# Patient Record
Sex: Female | Born: 1950
Health system: Southern US, Community
[De-identification: ages and names within clinical notes are randomized; demographics above are authoritative.]

## PROBLEM LIST (undated history)

## (undated) DIAGNOSIS — E785 Hyperlipidemia, unspecified: Secondary | ICD-10-CM

## (undated) DIAGNOSIS — I10 Essential (primary) hypertension: Secondary | ICD-10-CM

## (undated) DIAGNOSIS — I499 Cardiac arrhythmia, unspecified: Secondary | ICD-10-CM

## (undated) DIAGNOSIS — G2581 Restless legs syndrome: Secondary | ICD-10-CM

## (undated) DIAGNOSIS — M1990 Unspecified osteoarthritis, unspecified site: Secondary | ICD-10-CM

## (undated) DIAGNOSIS — I4891 Unspecified atrial fibrillation: Secondary | ICD-10-CM

## (undated) DIAGNOSIS — K5790 Diverticulosis of intestine, part unspecified, without perforation or abscess without bleeding: Secondary | ICD-10-CM

## (undated) DIAGNOSIS — K529 Noninfective gastroenteritis and colitis, unspecified: Secondary | ICD-10-CM

## (undated) HISTORY — DX: Diverticulosis of intestine, part unspecified, without perforation or abscess without bleeding: K57.90

## (undated) HISTORY — DX: Restless legs syndrome: G25.81

## (undated) HISTORY — PX: TOE SURGERY: SHX1073

## (undated) HISTORY — DX: Noninfective gastroenteritis and colitis, unspecified: K52.9

## (undated) HISTORY — PX: KNEE SURGERY: SHX244

---

## 2002-09-21 HISTORY — PX: CHOLECYSTECTOMY: SHX55

## 2014-03-20 DIAGNOSIS — I10 Essential (primary) hypertension: Secondary | ICD-10-CM | POA: Diagnosis not present

## 2014-03-20 DIAGNOSIS — F17200 Nicotine dependence, unspecified, uncomplicated: Secondary | ICD-10-CM | POA: Diagnosis not present

## 2014-03-23 DIAGNOSIS — I10 Essential (primary) hypertension: Secondary | ICD-10-CM | POA: Diagnosis not present

## 2014-03-23 DIAGNOSIS — Z0000 Encounter for general adult medical examination without abnormal findings: Secondary | ICD-10-CM | POA: Diagnosis not present

## 2014-03-27 DIAGNOSIS — I10 Essential (primary) hypertension: Secondary | ICD-10-CM | POA: Diagnosis not present

## 2014-03-27 DIAGNOSIS — M79609 Pain in unspecified limb: Secondary | ICD-10-CM | POA: Diagnosis not present

## 2014-04-19 DIAGNOSIS — Z01419 Encounter for gynecological examination (general) (routine) without abnormal findings: Secondary | ICD-10-CM | POA: Diagnosis not present

## 2014-04-19 DIAGNOSIS — Z1231 Encounter for screening mammogram for malignant neoplasm of breast: Secondary | ICD-10-CM | POA: Diagnosis not present

## 2014-04-19 DIAGNOSIS — Z0000 Encounter for general adult medical examination without abnormal findings: Secondary | ICD-10-CM | POA: Diagnosis not present

## 2014-04-19 DIAGNOSIS — Z23 Encounter for immunization: Secondary | ICD-10-CM | POA: Diagnosis not present

## 2014-04-19 DIAGNOSIS — Z780 Asymptomatic menopausal state: Secondary | ICD-10-CM | POA: Diagnosis not present

## 2014-04-25 DIAGNOSIS — Z1329 Encounter for screening for other suspected endocrine disorder: Secondary | ICD-10-CM | POA: Diagnosis not present

## 2014-04-25 DIAGNOSIS — E559 Vitamin D deficiency, unspecified: Secondary | ICD-10-CM | POA: Diagnosis not present

## 2014-04-25 DIAGNOSIS — G609 Hereditary and idiopathic neuropathy, unspecified: Secondary | ICD-10-CM | POA: Diagnosis not present

## 2014-04-25 DIAGNOSIS — G589 Mononeuropathy, unspecified: Secondary | ICD-10-CM | POA: Diagnosis not present

## 2014-04-25 DIAGNOSIS — E039 Hypothyroidism, unspecified: Secondary | ICD-10-CM | POA: Diagnosis not present

## 2014-04-25 DIAGNOSIS — G2581 Restless legs syndrome: Secondary | ICD-10-CM | POA: Diagnosis not present

## 2014-04-30 DIAGNOSIS — Z1231 Encounter for screening mammogram for malignant neoplasm of breast: Secondary | ICD-10-CM | POA: Diagnosis not present

## 2014-04-30 DIAGNOSIS — Z780 Asymptomatic menopausal state: Secondary | ICD-10-CM | POA: Diagnosis not present

## 2014-05-16 DIAGNOSIS — G5760 Lesion of plantar nerve, unspecified lower limb: Secondary | ICD-10-CM | POA: Diagnosis not present

## 2014-05-17 DIAGNOSIS — G5760 Lesion of plantar nerve, unspecified lower limb: Secondary | ICD-10-CM | POA: Diagnosis not present

## 2014-06-08 DIAGNOSIS — J069 Acute upper respiratory infection, unspecified: Secondary | ICD-10-CM | POA: Diagnosis not present

## 2014-06-11 DIAGNOSIS — R509 Fever, unspecified: Secondary | ICD-10-CM | POA: Diagnosis not present

## 2014-06-11 DIAGNOSIS — K5289 Other specified noninfective gastroenteritis and colitis: Secondary | ICD-10-CM | POA: Diagnosis not present

## 2014-06-11 DIAGNOSIS — J029 Acute pharyngitis, unspecified: Secondary | ICD-10-CM | POA: Diagnosis not present

## 2014-06-12 DIAGNOSIS — R509 Fever, unspecified: Secondary | ICD-10-CM | POA: Diagnosis not present

## 2014-06-16 DIAGNOSIS — R197 Diarrhea, unspecified: Secondary | ICD-10-CM | POA: Diagnosis not present

## 2014-06-16 DIAGNOSIS — F17200 Nicotine dependence, unspecified, uncomplicated: Secondary | ICD-10-CM | POA: Diagnosis not present

## 2014-06-16 DIAGNOSIS — I10 Essential (primary) hypertension: Secondary | ICD-10-CM | POA: Diagnosis not present

## 2014-06-16 DIAGNOSIS — R1110 Vomiting, unspecified: Secondary | ICD-10-CM | POA: Diagnosis not present

## 2014-06-16 DIAGNOSIS — R000 Tachycardia, unspecified: Secondary | ICD-10-CM | POA: Diagnosis not present

## 2014-06-19 DIAGNOSIS — E860 Dehydration: Secondary | ICD-10-CM | POA: Diagnosis not present

## 2014-06-19 DIAGNOSIS — K5289 Other specified noninfective gastroenteritis and colitis: Secondary | ICD-10-CM | POA: Diagnosis not present

## 2014-06-25 DIAGNOSIS — R079 Chest pain, unspecified: Secondary | ICD-10-CM | POA: Diagnosis not present

## 2014-06-25 DIAGNOSIS — K449 Diaphragmatic hernia without obstruction or gangrene: Secondary | ICD-10-CM | POA: Diagnosis not present

## 2014-06-25 DIAGNOSIS — R634 Abnormal weight loss: Secondary | ICD-10-CM | POA: Diagnosis not present

## 2014-06-25 DIAGNOSIS — Z23 Encounter for immunization: Secondary | ICD-10-CM | POA: Diagnosis not present

## 2014-06-25 DIAGNOSIS — R1031 Right lower quadrant pain: Secondary | ICD-10-CM | POA: Diagnosis not present

## 2014-06-25 DIAGNOSIS — I4891 Unspecified atrial fibrillation: Secondary | ICD-10-CM | POA: Diagnosis not present

## 2014-06-25 DIAGNOSIS — K648 Other hemorrhoids: Secondary | ICD-10-CM | POA: Diagnosis not present

## 2014-06-25 DIAGNOSIS — Z9049 Acquired absence of other specified parts of digestive tract: Secondary | ICD-10-CM | POA: Diagnosis not present

## 2014-06-25 DIAGNOSIS — R432 Parageusia: Secondary | ICD-10-CM | POA: Diagnosis not present

## 2014-06-25 DIAGNOSIS — I82401 Acute embolism and thrombosis of unspecified deep veins of right lower extremity: Secondary | ICD-10-CM | POA: Diagnosis not present

## 2014-06-25 DIAGNOSIS — Z833 Family history of diabetes mellitus: Secondary | ICD-10-CM | POA: Diagnosis not present

## 2014-06-25 DIAGNOSIS — I482 Chronic atrial fibrillation: Secondary | ICD-10-CM | POA: Diagnosis not present

## 2014-06-25 DIAGNOSIS — I10 Essential (primary) hypertension: Secondary | ICD-10-CM | POA: Diagnosis not present

## 2014-06-25 DIAGNOSIS — R531 Weakness: Secondary | ICD-10-CM | POA: Diagnosis not present

## 2014-06-25 DIAGNOSIS — R195 Other fecal abnormalities: Secondary | ICD-10-CM | POA: Diagnosis not present

## 2014-06-25 DIAGNOSIS — K259 Gastric ulcer, unspecified as acute or chronic, without hemorrhage or perforation: Secondary | ICD-10-CM | POA: Diagnosis not present

## 2014-06-25 DIAGNOSIS — I80221 Phlebitis and thrombophlebitis of right popliteal vein: Secondary | ICD-10-CM | POA: Diagnosis not present

## 2014-06-25 DIAGNOSIS — Z720 Tobacco use: Secondary | ICD-10-CM | POA: Diagnosis not present

## 2014-06-25 DIAGNOSIS — Z8249 Family history of ischemic heart disease and other diseases of the circulatory system: Secondary | ICD-10-CM | POA: Diagnosis not present

## 2014-06-25 DIAGNOSIS — R509 Fever, unspecified: Secondary | ICD-10-CM | POA: Diagnosis not present

## 2014-06-25 DIAGNOSIS — I82431 Acute embolism and thrombosis of right popliteal vein: Secondary | ICD-10-CM | POA: Diagnosis not present

## 2014-06-25 DIAGNOSIS — D61818 Other pancytopenia: Secondary | ICD-10-CM | POA: Diagnosis not present

## 2014-06-25 DIAGNOSIS — I480 Paroxysmal atrial fibrillation: Secondary | ICD-10-CM | POA: Diagnosis not present

## 2014-06-25 DIAGNOSIS — I82409 Acute embolism and thrombosis of unspecified deep veins of unspecified lower extremity: Secondary | ICD-10-CM | POA: Diagnosis not present

## 2014-06-25 DIAGNOSIS — K5730 Diverticulosis of large intestine without perforation or abscess without bleeding: Secondary | ICD-10-CM | POA: Diagnosis not present

## 2014-06-25 DIAGNOSIS — K284 Chronic or unspecified gastrojejunal ulcer with hemorrhage: Secondary | ICD-10-CM | POA: Diagnosis not present

## 2014-06-25 DIAGNOSIS — Z888 Allergy status to other drugs, medicaments and biological substances status: Secondary | ICD-10-CM | POA: Diagnosis not present

## 2014-06-25 DIAGNOSIS — E860 Dehydration: Secondary | ICD-10-CM | POA: Diagnosis not present

## 2014-06-25 DIAGNOSIS — D6489 Other specified anemias: Secondary | ICD-10-CM | POA: Diagnosis not present

## 2014-06-25 DIAGNOSIS — K922 Gastrointestinal hemorrhage, unspecified: Secondary | ICD-10-CM | POA: Diagnosis not present

## 2014-06-25 DIAGNOSIS — R197 Diarrhea, unspecified: Secondary | ICD-10-CM | POA: Diagnosis not present

## 2014-06-25 DIAGNOSIS — R112 Nausea with vomiting, unspecified: Secondary | ICD-10-CM | POA: Diagnosis not present

## 2014-06-25 DIAGNOSIS — Z716 Tobacco abuse counseling: Secondary | ICD-10-CM | POA: Diagnosis not present

## 2014-06-26 DIAGNOSIS — I82409 Acute embolism and thrombosis of unspecified deep veins of unspecified lower extremity: Secondary | ICD-10-CM | POA: Diagnosis not present

## 2014-06-26 DIAGNOSIS — I4891 Unspecified atrial fibrillation: Secondary | ICD-10-CM | POA: Diagnosis not present

## 2014-07-06 DIAGNOSIS — E860 Dehydration: Secondary | ICD-10-CM | POA: Diagnosis not present

## 2014-07-06 DIAGNOSIS — D649 Anemia, unspecified: Secondary | ICD-10-CM | POA: Diagnosis not present

## 2014-07-16 DIAGNOSIS — I10 Essential (primary) hypertension: Secondary | ICD-10-CM | POA: Diagnosis not present

## 2014-07-16 DIAGNOSIS — I82409 Acute embolism and thrombosis of unspecified deep veins of unspecified lower extremity: Secondary | ICD-10-CM | POA: Diagnosis not present

## 2014-07-16 DIAGNOSIS — D649 Anemia, unspecified: Secondary | ICD-10-CM | POA: Diagnosis not present

## 2014-07-16 DIAGNOSIS — I480 Paroxysmal atrial fibrillation: Secondary | ICD-10-CM | POA: Diagnosis not present

## 2014-07-16 DIAGNOSIS — I519 Heart disease, unspecified: Secondary | ICD-10-CM | POA: Diagnosis not present

## 2014-07-16 DIAGNOSIS — I8290 Acute embolism and thrombosis of unspecified vein: Secondary | ICD-10-CM | POA: Diagnosis not present

## 2014-07-16 DIAGNOSIS — I82431 Acute embolism and thrombosis of right popliteal vein: Secondary | ICD-10-CM | POA: Diagnosis not present

## 2014-07-18 DIAGNOSIS — G5762 Lesion of plantar nerve, left lower limb: Secondary | ICD-10-CM | POA: Diagnosis not present

## 2014-07-27 DIAGNOSIS — Z7901 Long term (current) use of anticoagulants: Secondary | ICD-10-CM | POA: Diagnosis not present

## 2014-07-31 DIAGNOSIS — I82401 Acute embolism and thrombosis of unspecified deep veins of right lower extremity: Secondary | ICD-10-CM | POA: Diagnosis not present

## 2014-07-31 DIAGNOSIS — I480 Paroxysmal atrial fibrillation: Secondary | ICD-10-CM | POA: Diagnosis not present

## 2014-08-02 DIAGNOSIS — Z7901 Long term (current) use of anticoagulants: Secondary | ICD-10-CM | POA: Diagnosis not present

## 2014-08-06 DIAGNOSIS — Z7901 Long term (current) use of anticoagulants: Secondary | ICD-10-CM | POA: Diagnosis not present

## 2014-08-14 DIAGNOSIS — Z7901 Long term (current) use of anticoagulants: Secondary | ICD-10-CM | POA: Diagnosis not present

## 2014-08-20 DIAGNOSIS — Z7901 Long term (current) use of anticoagulants: Secondary | ICD-10-CM | POA: Diagnosis not present

## 2014-08-27 DIAGNOSIS — I8290 Acute embolism and thrombosis of unspecified vein: Secondary | ICD-10-CM | POA: Diagnosis not present

## 2014-08-27 DIAGNOSIS — D649 Anemia, unspecified: Secondary | ICD-10-CM | POA: Diagnosis not present

## 2014-08-27 DIAGNOSIS — I82401 Acute embolism and thrombosis of unspecified deep veins of right lower extremity: Secondary | ICD-10-CM | POA: Diagnosis not present

## 2014-08-27 DIAGNOSIS — Z7901 Long term (current) use of anticoagulants: Secondary | ICD-10-CM | POA: Diagnosis not present

## 2014-09-01 DIAGNOSIS — I4891 Unspecified atrial fibrillation: Secondary | ICD-10-CM | POA: Diagnosis not present

## 2014-09-01 DIAGNOSIS — I249 Acute ischemic heart disease, unspecified: Secondary | ICD-10-CM | POA: Diagnosis not present

## 2014-09-01 DIAGNOSIS — R0789 Other chest pain: Secondary | ICD-10-CM | POA: Diagnosis not present

## 2014-09-01 DIAGNOSIS — R0602 Shortness of breath: Secondary | ICD-10-CM | POA: Diagnosis not present

## 2014-09-01 DIAGNOSIS — I10 Essential (primary) hypertension: Secondary | ICD-10-CM | POA: Diagnosis not present

## 2014-09-01 DIAGNOSIS — Z720 Tobacco use: Secondary | ICD-10-CM | POA: Diagnosis not present

## 2014-09-01 DIAGNOSIS — I824Z1 Acute embolism and thrombosis of unspecified deep veins of right distal lower extremity: Secondary | ICD-10-CM | POA: Diagnosis not present

## 2014-09-01 DIAGNOSIS — R079 Chest pain, unspecified: Secondary | ICD-10-CM | POA: Diagnosis not present

## 2014-09-01 DIAGNOSIS — E785 Hyperlipidemia, unspecified: Secondary | ICD-10-CM | POA: Diagnosis not present

## 2014-09-01 DIAGNOSIS — I480 Paroxysmal atrial fibrillation: Secondary | ICD-10-CM | POA: Diagnosis not present

## 2014-09-01 DIAGNOSIS — Z7901 Long term (current) use of anticoagulants: Secondary | ICD-10-CM | POA: Diagnosis not present

## 2014-09-02 DIAGNOSIS — I10 Essential (primary) hypertension: Secondary | ICD-10-CM | POA: Diagnosis not present

## 2014-09-02 DIAGNOSIS — R079 Chest pain, unspecified: Secondary | ICD-10-CM | POA: Diagnosis not present

## 2014-09-02 DIAGNOSIS — I4891 Unspecified atrial fibrillation: Secondary | ICD-10-CM | POA: Diagnosis not present

## 2014-09-02 DIAGNOSIS — R0602 Shortness of breath: Secondary | ICD-10-CM | POA: Diagnosis not present

## 2014-09-03 DIAGNOSIS — I82409 Acute embolism and thrombosis of unspecified deep veins of unspecified lower extremity: Secondary | ICD-10-CM | POA: Diagnosis not present

## 2014-09-03 DIAGNOSIS — I480 Paroxysmal atrial fibrillation: Secondary | ICD-10-CM | POA: Diagnosis not present

## 2014-09-03 DIAGNOSIS — E876 Hypokalemia: Secondary | ICD-10-CM | POA: Diagnosis not present

## 2014-09-03 DIAGNOSIS — R0789 Other chest pain: Secondary | ICD-10-CM | POA: Diagnosis not present

## 2014-09-03 DIAGNOSIS — I10 Essential (primary) hypertension: Secondary | ICD-10-CM | POA: Diagnosis not present

## 2014-09-03 DIAGNOSIS — R079 Chest pain, unspecified: Secondary | ICD-10-CM | POA: Diagnosis not present

## 2014-09-05 DIAGNOSIS — Z7901 Long term (current) use of anticoagulants: Secondary | ICD-10-CM | POA: Diagnosis not present

## 2014-09-10 DIAGNOSIS — R079 Chest pain, unspecified: Secondary | ICD-10-CM | POA: Diagnosis not present

## 2014-09-10 DIAGNOSIS — F329 Major depressive disorder, single episode, unspecified: Secondary | ICD-10-CM | POA: Diagnosis not present

## 2014-09-19 DIAGNOSIS — Z7901 Long term (current) use of anticoagulants: Secondary | ICD-10-CM | POA: Diagnosis not present

## 2014-10-03 DIAGNOSIS — Z7901 Long term (current) use of anticoagulants: Secondary | ICD-10-CM | POA: Diagnosis not present

## 2014-11-05 DIAGNOSIS — Z720 Tobacco use: Secondary | ICD-10-CM | POA: Diagnosis not present

## 2014-11-05 DIAGNOSIS — F329 Major depressive disorder, single episode, unspecified: Secondary | ICD-10-CM | POA: Diagnosis not present

## 2014-11-05 DIAGNOSIS — Z716 Tobacco abuse counseling: Secondary | ICD-10-CM | POA: Diagnosis not present

## 2014-11-05 DIAGNOSIS — Z7901 Long term (current) use of anticoagulants: Secondary | ICD-10-CM | POA: Diagnosis not present

## 2014-11-12 DIAGNOSIS — Z7901 Long term (current) use of anticoagulants: Secondary | ICD-10-CM | POA: Diagnosis not present

## 2014-12-06 DIAGNOSIS — Z7901 Long term (current) use of anticoagulants: Secondary | ICD-10-CM | POA: Diagnosis not present

## 2014-12-06 DIAGNOSIS — I82401 Acute embolism and thrombosis of unspecified deep veins of right lower extremity: Secondary | ICD-10-CM | POA: Diagnosis not present

## 2015-01-14 DIAGNOSIS — I82409 Acute embolism and thrombosis of unspecified deep veins of unspecified lower extremity: Secondary | ICD-10-CM | POA: Diagnosis not present

## 2015-01-14 DIAGNOSIS — I517 Cardiomegaly: Secondary | ICD-10-CM | POA: Diagnosis not present

## 2015-01-14 DIAGNOSIS — I5030 Unspecified diastolic (congestive) heart failure: Secondary | ICD-10-CM | POA: Diagnosis not present

## 2015-01-14 DIAGNOSIS — I480 Paroxysmal atrial fibrillation: Secondary | ICD-10-CM | POA: Diagnosis not present

## 2015-01-14 DIAGNOSIS — I5042 Chronic combined systolic (congestive) and diastolic (congestive) heart failure: Secondary | ICD-10-CM | POA: Diagnosis not present

## 2015-01-14 DIAGNOSIS — I2699 Other pulmonary embolism without acute cor pulmonale: Secondary | ICD-10-CM | POA: Diagnosis not present

## 2015-01-14 DIAGNOSIS — I10 Essential (primary) hypertension: Secondary | ICD-10-CM | POA: Diagnosis not present

## 2015-01-24 DIAGNOSIS — I8290 Acute embolism and thrombosis of unspecified vein: Secondary | ICD-10-CM | POA: Diagnosis not present

## 2015-01-24 DIAGNOSIS — I82401 Acute embolism and thrombosis of unspecified deep veins of right lower extremity: Secondary | ICD-10-CM | POA: Diagnosis not present

## 2015-01-24 DIAGNOSIS — D649 Anemia, unspecified: Secondary | ICD-10-CM | POA: Diagnosis not present

## 2015-05-24 ENCOUNTER — Ambulatory Visit (INDEPENDENT_AMBULATORY_CARE_PROVIDER_SITE_OTHER): Payer: Medicare Other | Admitting: Primary Care

## 2015-05-24 ENCOUNTER — Encounter (INDEPENDENT_AMBULATORY_CARE_PROVIDER_SITE_OTHER): Payer: Self-pay

## 2015-05-24 ENCOUNTER — Encounter: Payer: Self-pay | Admitting: Primary Care

## 2015-05-24 VITALS — BP 122/70 | HR 64 | Temp 98.2°F | Ht 66.0 in | Wt 178.4 lb

## 2015-05-24 DIAGNOSIS — F411 Generalized anxiety disorder: Secondary | ICD-10-CM

## 2015-05-24 DIAGNOSIS — F419 Anxiety disorder, unspecified: Secondary | ICD-10-CM

## 2015-05-24 DIAGNOSIS — I4891 Unspecified atrial fibrillation: Secondary | ICD-10-CM | POA: Diagnosis not present

## 2015-05-24 DIAGNOSIS — Z720 Tobacco use: Secondary | ICD-10-CM | POA: Diagnosis not present

## 2015-05-24 DIAGNOSIS — F32A Depression, unspecified: Secondary | ICD-10-CM

## 2015-05-24 DIAGNOSIS — I10 Essential (primary) hypertension: Secondary | ICD-10-CM | POA: Diagnosis not present

## 2015-05-24 DIAGNOSIS — F329 Major depressive disorder, single episode, unspecified: Secondary | ICD-10-CM | POA: Insufficient documentation

## 2015-05-24 DIAGNOSIS — F17200 Nicotine dependence, unspecified, uncomplicated: Secondary | ICD-10-CM | POA: Insufficient documentation

## 2015-05-24 DIAGNOSIS — I480 Paroxysmal atrial fibrillation: Secondary | ICD-10-CM | POA: Insufficient documentation

## 2015-05-24 HISTORY — DX: Depression, unspecified: F32.A

## 2015-05-24 HISTORY — DX: Anxiety disorder, unspecified: F41.9

## 2015-05-24 HISTORY — DX: Paroxysmal atrial fibrillation: I48.0

## 2015-05-24 MED ORDER — METOPROLOL SUCCINATE ER 50 MG PO TB24: 50.0000 mg | ORAL_TABLET | Freq: Every day | ORAL | Status: DC

## 2015-05-24 MED ORDER — SERTRALINE HCL 50 MG PO TABS: 50.0000 mg | ORAL_TABLET | Freq: Every day | ORAL | Status: DC

## 2015-05-24 NOTE — Assessment & Plan Note (Signed)
Managed on lopressor 100 BID. She forgets to take the second dose often. Will switch to Toprol XL 50 mg once daily. HR 64 today . BP stable. History of Afib. Will continue to monitor.

## 2015-05-24 NOTE — Assessment & Plan Note (Signed)
History of once in October 2015. Once managed on coumadin for DVT which was d/c'd in June 2016. Denies palpitations, irregular HR, chest pain. Start daily aspirin today.  Will continue to monitor.

## 2015-05-24 NOTE — Patient Instructions (Signed)
Apply the 21 mg/day daily to a non hairy place on your arm. Do this for 6 weeks then decrease down to the 14 mg/day patch for 2 weeks, then decrease down to the 7 mg/day patch for 2 weeks. Replace daily.  If you get cravings in between you may also chew the nicotine gum or use the spray. Follow package directions.  Start metoprolol succinate (Toprol XL) tablets for blood pressure and heart rate. Take 1 tablet by mouth daily. Please notify me if you develop rapid heart rate or an increase in blood pressure.  I sent refills on your Sertraline.  Please schedule a physical with me in the next 3 months. You will also schedule a lab only appointment one week prior. We will discuss your lab results during your physical.  It was a pleasure to meet you today! Please don't hesitate to call me with any questions. Welcome to [facility]!

## 2015-05-24 NOTE — Assessment & Plan Note (Signed)
Smokes 1 PPD since teenage years. Quit for 10 years in the 90's. She is ready to quit today. Directions provided for patch. Use gum PRN for oral cravings. Will continue to monitor.

## 2015-05-24 NOTE — Assessment & Plan Note (Addendum)
Long standing history of. Currently managed on Zoloft 50 mg, feels well managed at current dose. Recent difficulty falling asleep on 2 specific occasions. She is to notify me if this becomes more frequent. Refills provided. Denies SI/HI.

## 2015-05-24 NOTE — Progress Notes (Signed)
Pre visit review using our clinic review tool, if applicable. No additional management support is needed unless otherwise documented below in the visit note. 

## 2015-05-24 NOTE — Progress Notes (Signed)
Subjective:    Patient ID: Stephanie [name], female    DOB: 1951/07/12, 64 y.o.   MRN: [id number]  HPI  Stephanie [name] is a 64 year old female who presents today to establish care and discuss the problems mentioned below. Will obtain old records.  1) Tobacco abuse: Smokes cigarettes. Started smoking in her teenage years, quit in the 1990's for 10 years and started back up in 2000. She's tried Chantix but could not tolerate due to nightmares, and  patches. She has had some help with the patch prior, but wasn't mentally ready at the time. She is ready today to quit. She currently smokes 1 PPD. Denies cough, hemoptysis; has minimal shortness of breath.  2) Essential hypertension: Diagnosed years ago. Managed on lopressor 100 mg twice daily. She was once managed by cardiology in [place]. No chest pain, headaches, dizziness, changes in vision.  3) Generalized Anxiety Disorder: Long standing history. Recently placed back on Zoloft 50 mg, was once on for years. She feels well managed with her current dose. Denies panic attacks and SI/HI. Recently she's had difficulty sleeping. She had 2 episodes where she could not sleep all night. This occurred 2-3 weeks ago. She's had no changes in routine or caffeine consumption. Slept well last several nights.  4) Atrial fibrillation: Discovered in October 2015 and was found to have a DVT. Her hematologist removed her from her Warfarin in June 2016 before moving to [facility]. She denies any atrial fibrillation, irregular heart rate, or palpitations since October 2015. She is currently not taking aspirin or any other blood thinning products.  Review of Systems  Constitutional: Negative for unexpected weight change.  HENT: Negative for rhinorrhea.   Respiratory: Negative for cough and shortness of breath.   Cardiovascular: Negative for chest pain.  Gastrointestinal: Negative for diarrhea and constipation.       Endorses history of IBS diarrhea type.  Genitourinary: Negative  for difficulty urinating.       Partial hysterectomy.   Musculoskeletal: Negative for myalgias and arthralgias.  Skin: Negative for rash.  Neurological: Negative for dizziness and headaches.  Psychiatric/Behavioral:       See HPI.       No past medical history on file.  Social History   Social History  . Marital Status: Single    Spouse Name: N/A  . Number of Children: N/A  . Years of Education: N/A   Occupational History  . Not on file.   Social History Main Topics  . Smoking status: Current Every Day Smoker -- 1.00 packs/day    Types: Cigarettes  . Smokeless tobacco: Not on file  . Alcohol Use: 0.0 oz/week    0 Standard drinks or equivalent per week     Comment: social  . Drug Use: Not on file  . Sexual Activity: Not on file   Other Topics Concern  . Not on file   Social History Narrative   Divorced   Just moved from [place].   Originally from [place].   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    No past surgical history on file.  No family history on file.  Allergies  Allergen Reactions  . Codeine   . Pyridium  [Phenazopyridine Hcl]     No current outpatient prescriptions on file prior to visit.   No current facility-administered medications on file prior to visit.    BP 122/70 mmHg  Pulse 64  Temp(Src) 98.2 F (36.8 C) (  Oral)  Ht 5\' 6"  (1.676 m)  Wt 178 lb 6.4 oz (80.922 kg)  BMI 28.81 kg/m2  SpO2 98%    Objective:   Physical Exam  Constitutional: She is oriented to person, place, and time. She appears well-nourished.  Cardiovascular: Normal rate and regular rhythm.   Pulmonary/Chest: Effort normal and breath sounds normal.  Neurological: She is alert and oriented to person, place, and time.  Skin: Skin is warm and dry.          Assessment & Plan:

## 2015-05-27 DIAGNOSIS — H04123 Dry eye syndrome of bilateral lacrimal glands: Secondary | ICD-10-CM | POA: Diagnosis not present

## 2015-05-27 DIAGNOSIS — H5203 Hypermetropia, bilateral: Secondary | ICD-10-CM | POA: Diagnosis not present

## 2015-05-27 DIAGNOSIS — H524 Presbyopia: Secondary | ICD-10-CM | POA: Diagnosis not present

## 2015-05-27 DIAGNOSIS — H52223 Regular astigmatism, bilateral: Secondary | ICD-10-CM | POA: Diagnosis not present

## 2015-05-27 DIAGNOSIS — H2513 Age-related nuclear cataract, bilateral: Secondary | ICD-10-CM | POA: Diagnosis not present

## 2015-07-16 ENCOUNTER — Telehealth: Payer: Self-pay | Admitting: Primary Care

## 2015-07-16 NOTE — Telephone Encounter (Signed)
If I recall correctly, she experienced nightmares with Chantix, does she still want to try this again?

## 2015-07-16 NOTE — Telephone Encounter (Signed)
Pt called stating she was not doing well on patches  for no smoking.  She wanted to know if she could get a rx [facility] Please advise

## 2015-07-17 ENCOUNTER — Telehealth: Payer: Self-pay | Admitting: Primary Care

## 2015-07-17 ENCOUNTER — Other Ambulatory Visit: Payer: Self-pay | Admitting: Primary Care

## 2015-07-17 DIAGNOSIS — Z720 Tobacco use: Secondary | ICD-10-CM

## 2015-07-17 MED ORDER — VARENICLINE TARTRATE 0.5 MG X 11 & 1 MG X 42 PO MISC: ORAL | Status: DC

## 2015-07-17 MED ORDER — VARENICLINE TARTRATE 1 MG PO TABS: 1.0000 mg | ORAL_TABLET | Freq: Two times a day (BID) | ORAL | Status: DC

## 2015-07-17 NOTE — Telephone Encounter (Signed)
We will call in the Chantix starting pack to her pharmacy. I have sent in the continuing month pack to her pharmacy. She will start with the starting pack and then progress to the continuing. We will follow up during her upcoming physical.

## 2015-07-17 NOTE — Telephone Encounter (Signed)
Called and notified patient of Kate's comments. Patient verbalized understanding. Patient stated that she would like to give Chantix another try. Patient also wanted to know if Anda Kraft received medical records yet regarding medication on her rest legs. Will talk about again on the next appointment.

## 2015-07-17 NOTE — Telephone Encounter (Signed)
Called in the [facility] starter pack to [facility]. Called and notified patient to start on the starter pack.

## 2015-08-09 ENCOUNTER — Other Ambulatory Visit: Payer: Self-pay | Admitting: Internal Medicine

## 2015-08-11 ENCOUNTER — Other Ambulatory Visit: Payer: Self-pay | Admitting: Primary Care

## 2015-08-11 DIAGNOSIS — E785 Hyperlipidemia, unspecified: Secondary | ICD-10-CM

## 2015-08-11 DIAGNOSIS — I10 Essential (primary) hypertension: Secondary | ICD-10-CM

## 2015-08-11 DIAGNOSIS — Z13220 Encounter for screening for lipoid disorders: Secondary | ICD-10-CM

## 2015-08-11 DIAGNOSIS — Z0000 Encounter for general adult medical examination without abnormal findings: Secondary | ICD-10-CM

## 2015-08-19 ENCOUNTER — Other Ambulatory Visit (INDEPENDENT_AMBULATORY_CARE_PROVIDER_SITE_OTHER): Payer: Medicare Other

## 2015-08-19 DIAGNOSIS — I10 Essential (primary) hypertension: Secondary | ICD-10-CM

## 2015-08-19 LAB — COMPREHENSIVE METABOLIC PANEL
ALBUMIN: 3.6 g/dL (ref 3.5–5.2)
ALT: 11 U/L (ref 0–35)
AST: 16 U/L (ref 0–37)
Alkaline Phosphatase: 66 U/L (ref 39–117)
BUN: 17 mg/dL (ref 6–23)
CHLORIDE: 108 meq/L (ref 96–112)
CO2: 29 mEq/L (ref 19–32)
CREATININE: 0.99 mg/dL (ref 0.40–1.20)
Calcium: 9.1 mg/dL (ref 8.4–10.5)
GFR: 59.57 mL/min — ABNORMAL LOW (ref >60.00)
GLUCOSE: 105 mg/dL — AB (ref 70–99)
Potassium: 4.5 mEq/L (ref 3.5–5.1)
SODIUM: 144 meq/L (ref 135–145)
TOTAL PROTEIN: 6.3 g/dL (ref 6.0–8.3)
Total Bilirubin: 0.5 mg/dL (ref 0.2–1.2)

## 2015-08-19 LAB — LIPID PANEL
CHOLESTEROL: 165 mg/dL (ref 0–200)
HDL: 50.4 mg/dL (ref >39.00)
LDL CALC: 98 mg/dL (ref 0–99)
NONHDL: 114.74
Total CHOL/HDL Ratio: 3
Triglycerides: 82 mg/dL (ref 0.0–149.0)
VLDL: 16.4 mg/dL (ref 0.0–40.0)

## 2015-08-23 ENCOUNTER — Encounter: Payer: Self-pay | Admitting: Primary Care

## 2015-08-23 ENCOUNTER — Ambulatory Visit (INDEPENDENT_AMBULATORY_CARE_PROVIDER_SITE_OTHER): Payer: Medicare Other | Admitting: Primary Care

## 2015-08-23 VITALS — BP 118/80 | HR 96 | Temp 98.1°F | Ht 66.0 in | Wt 183.1 lb

## 2015-08-23 DIAGNOSIS — Z720 Tobacco use: Secondary | ICD-10-CM | POA: Diagnosis not present

## 2015-08-23 DIAGNOSIS — I10 Essential (primary) hypertension: Secondary | ICD-10-CM

## 2015-08-23 DIAGNOSIS — F418 Other specified anxiety disorders: Secondary | ICD-10-CM

## 2015-08-23 DIAGNOSIS — F419 Anxiety disorder, unspecified: Principal | ICD-10-CM

## 2015-08-23 DIAGNOSIS — Z0000 Encounter for general adult medical examination without abnormal findings: Secondary | ICD-10-CM | POA: Insufficient documentation

## 2015-08-23 DIAGNOSIS — Z23 Encounter for immunization: Secondary | ICD-10-CM | POA: Diagnosis not present

## 2015-08-23 DIAGNOSIS — G2581 Restless legs syndrome: Secondary | ICD-10-CM | POA: Insufficient documentation

## 2015-08-23 DIAGNOSIS — F32A Depression, unspecified: Secondary | ICD-10-CM

## 2015-08-23 DIAGNOSIS — I4891 Unspecified atrial fibrillation: Secondary | ICD-10-CM

## 2015-08-23 DIAGNOSIS — F329 Major depressive disorder, single episode, unspecified: Secondary | ICD-10-CM

## 2015-08-23 MED ORDER — ROPINIROLE HCL 0.25 MG PO TABS: 0.2500 mg | ORAL_TABLET | Freq: Every day | ORAL | Status: DC

## 2015-08-23 MED ORDER — BUPROPION HCL ER (XL) 150 MG PO TB24: 150.0000 mg | ORAL_TABLET | Freq: Every day | ORAL | Status: DC

## 2015-08-23 NOTE — Patient Instructions (Addendum)
Start Bupropion XL 150 mg tablets for tobacco cessation and depression. Take 1 tablet by mouth every morning.  Start exercising. You should be getting 1 hour of moderate intensity exercise 5 days weekly. Work up to this slowly.  Follow up in 6 months for check up.  It was a pleasure to see you today!

## 2015-08-23 NOTE — Assessment & Plan Note (Signed)
Regular rate and rhythm today. Currently managed on daily aspirin.

## 2015-08-23 NOTE — Assessment & Plan Note (Signed)
Improvement on Zoloft 50 mg, but has felt down/depressed over the last month. PHQ 9 score of 12 today. Will start Wellbutrin XL for depression and tobacco abuse. Will obtain update from patient in 1 month.

## 2015-08-23 NOTE — Progress Notes (Signed)
Pre visit review using our clinic review tool, if applicable. No additional management support is needed unless otherwise documented below in the visit note. 

## 2015-08-23 NOTE — Assessment & Plan Note (Signed)
Never obtained Chantix, too expensive. She is cutting back on her daily amount of cigarettes, but still struggles. Added Wellbutrin today for depression and tobacco abuse. Will obtain update from patient in 1 month.

## 2015-08-23 NOTE — Assessment & Plan Note (Signed)
First wellness visit was 1 year ago. Influenza and pneumovax provided today. She will check insurance for coverage of Zostavax. Discussed importance of healthy diet and routine exercise.   Labs good. Exam unremarkable.  I have personally reviewed and have noted: 1. The patient's medical and social history 2. Their use of alcohol, tobacco or illicit drugs 3. Their current medications and supplements 4. The patient's functional ability including ADL's, fall risks,  home safety risks and hearing or visual impairment. 5. Diet and physical activities 6. Evidence for depression or mood disorder  Follow up in 1 year for subsequent visit.

## 2015-08-23 NOTE — Assessment & Plan Note (Signed)
Longstanding history of. Once on requip, has not been on for 2 years. Increase in symptoms recently. Requip 0.25 mg sent to pharmacy. She is to update [place] in 1 month.

## 2015-08-23 NOTE — Assessment & Plan Note (Signed)
Stable today. Continue Toprol XL 50 mg.

## 2015-08-23 NOTE — Progress Notes (Signed)
Patient ID: Stephanie [name], female   DOB: 1951-02-14, 64 y.o.   MRN: [id number]  HPI: Stephanie [name] is a 64 year old female who presents today for annual wellness visit, initial.  No past medical history on file.  Current Outpatient Prescriptions  Medication Sig Dispense Refill  . calcium citrate-vitamin D (CITRACAL+D) 315-200 MG-UNIT per tablet Take by mouth.    . metoprolol succinate (TOPROL-XL) 50 MG 24 hr tablet Take 1 tablet (50 mg total) by mouth daily. Take with or immediately following a meal. 30 tablet 3  . sertraline (ZOLOFT) 50 MG tablet Take 1 tablet (50 mg total) by mouth daily. 30 tablet 5   No current facility-administered medications for this visit.    Allergies  Allergen Reactions  . Codeine   . Pyridium  [Phenazopyridine Hcl]     No family history on file.  Social History   Social History  . Marital Status: Single    Spouse Name: N/A  . Number of Children: N/A  . Years of Education: N/A   Occupational History  . Not on file.   Social History Main Topics  . Smoking status: Current Every Day Smoker -- 1.00 packs/day    Types: Cigarettes  . Smokeless tobacco: Not on file  . Alcohol Use: 0.0 oz/week    0 Standard drinks or equivalent per week     Comment: social  . Drug Use: Not on file  . Sexual Activity: Not on file   Other Topics Concern  . Not on file   Social History Narrative   Divorced   Just moved from [place].   Originally from [place].   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    Hospitiliaztions: November 2015 for atrial fibrillation and blood clot, [place].  Health Maintenance:    Flu: Due, provide today.  Tetanus: Unsure.  Pneumovax: Has not completed, due today.  Prevnar: Completed in 2014  Zostavax: Has never completed.   Bone Density: Completed in [place] 1 year ago. Normal per patient.  Colonoscopy: Completed in 2015. Normal. Repeat in 5 years.  Eye Doctor: [facility],  completed in September 2016.  Dental Exam: Completed 2 years ago. Plans to schedule.  Mammogram: Completed 1 year ago. Wants to defer until next year.  PAP: Hysterectomy     Providers: Alma Friendly, PCP   I have personally reviewed and have noted: 1. The patient's medical and social history 2. Their use of alcohol, tobacco or illicit drugs 3. Their current medications and supplements 4. The patient's functional ability including ADL's, fall risks, home safety risks  and hearing or visual impairment. 5. Diet and physical activities 6. Evidence for depression or mood disorder  Subjective:   Review of Systems:   Constitutional: Denies fever, malaise, fatigue, headache or abrupt weight changes.  HEENT: Denies eye pain, eye redness, ear pain, ringing in the ears, wax buildup, runny nose, nasal congestion, bloody nose, or sore throat. Respiratory: Denies difficulty breathing, shortness of breath, cough or sputum production.  Current smoker.  Cardiovascular: Denies chest pain, chest tightness, palpitations or swelling in the hands or feet.  Gastrointestinal: Denies abdominal pain, bloating, constipation, diarrhea or blood in the stool. History of IBS, no recent flares. GU: Denies urgency, frequency, pain with urination, burning sensation, blood in urine, odor or discharge.  Musculoskeletal: Denies decrease in range of motion, difficulty with gait, muscle pain or joint pain and swelling. Pain to right hip intermittently for years.  Skin: Denies  redness, rashes, lesions or ulcercations. Does have dryness to skin. Neurological: Denies dizziness, difficulty with memory, difficulty with speech or problems with balance and coordination.   Restless leg: Long standing history. Once managed on Requip. Lately she's noticed increased symptoms at night. She's not been on medication in 2 years. She would like refills of her medication.   No other specific complaints in a complete review of systems  (except as listed in HPI above).  Objective:  PE:   Ht 5\' 6"  (1.676 m)  Wt 183 lb 1.9 oz (83.063 kg)  BMI 29.57 kg/m2 Wt Readings from Last 3 Encounters:  08/23/15 183 lb 1.9 oz (83.063 kg)  05/24/15 178 lb 6.4 oz (80.922 kg)    General: Appears their stated age, well developed, well nourished in NAD. Skin: Warm, dry and intact. No rashes, lesions or ulcerations noted. HEENT: Head: normal shape and size; Eyes: sclera white, no icterus, conjunctiva pink, PERRLA and EOMs intact; Ears: Tm's gray and intact, normal light reflex; Nose: mucosa pink and moist, septum midline; Throat/Mouth: Teeth present, mucosa pink and moist, no exudate, lesions or ulcerations noted.  Neck: Normal range of motion. Neck supple, trachea midline. No massses, lumps or thyromegaly present.  Cardiovascular: Normal rate and rhythm. S1,S2 noted.  No murmur, rubs or gallops noted. No JVD or BLE edema. No carotid bruits noted. Pulmonary/Chest: Normal effort and positive vesicular breath sounds. No respiratory distress. No wheezes, rales or ronchi noted.  Abdomen: Soft and nontender. Normal bowel sounds, no bruits noted. No distention or masses noted. Liver, spleen and kidneys non palpable. Musculoskeletal: Normal range of motion. No signs of joint swelling. No difficulty with gait.  Neurological: Alert and oriented. Cranial nerves II-XII intact. Coordination normal. +DTRs bilaterally. Psychiatric: Mood and affect normal. Behavior is normal. Judgment and thought content normal. PHQ 9 score of 12 today. Currently managed on Zoloft 50 mg.   BMET    Component Value Date/Time   NA 144 08/19/2015 0843   K 4.5 08/19/2015 0843   CL 108 08/19/2015 0843   CO2 29 08/19/2015 0843   GLUCOSE 105* 08/19/2015 0843   BUN 17 08/19/2015 0843   CREATININE 0.99 08/19/2015 0843   CALCIUM 9.1 08/19/2015 0843    Lipid Panel     Component Value Date/Time   CHOL 165 08/19/2015 0843   TRIG 82.0 08/19/2015 0843   HDL 50.40  08/19/2015 0843   CHOLHDL 3 08/19/2015 0843   VLDL 16.4 08/19/2015 0843   LDLCALC 98 08/19/2015 0843    CBC No results found for: WBC, RBC, HGB, HCT, PLT, MCV, MCH, MCHC, RDW, LYMPHSABS, MONOABS, EOSABS, BASOSABS  Hgb A1C No results found for: HGBA1C    Assessment and Plan:   Medicare Annual Wellness Visit:  Diet: Endorse a poor diet. Breakfast: Granola bar, fruit, or skips Lunch: Yogurt, pack of crackers, chips, fruit Dinner: Home made meals (salad, sandwiches, fruit), frozen meals, occasionally eats out. Desserts: Occasionally pudding, or small piece of chocolate, snack cakes.  Beverages: Water, green tea, coffee Physical activity: She is not currently exercising. Depression/mood screen: PHQ 9 score of 12. Currently managed on Zoloft 50 mg, increased dose to 100 mg.  Hearing: Intact to whispered voice Visual acuity: Grossly normal, performs annual eye exam  ADLs: Capable Fall risk: None Home safety: Good Cognitive evaluation: Intact to orientation, naming, recall and repetition EOL planning: Adv directives, full code/ I agree  Preventative Medicine: Influenza and pneumovax provided today. She will check insurance for coverage of Zostavax. Discussed importance  of healthy diet and routine exercise.   Next appointment: 6 months for evaluation of chronic conditions.

## 2015-09-15 ENCOUNTER — Other Ambulatory Visit: Payer: Self-pay | Admitting: Primary Care

## 2015-09-24 ENCOUNTER — Telehealth: Payer: Self-pay | Admitting: Primary Care

## 2015-09-24 NOTE — Telephone Encounter (Signed)
How's she doing on the Wellbutrin for smoking cessation and depression?

## 2015-09-24 NOTE — Telephone Encounter (Signed)
Called and spoken to patient. Patient stated that she is doing okay with Wellbutrin, better than before.

## 2015-12-22 ENCOUNTER — Other Ambulatory Visit: Payer: Self-pay | Admitting: Primary Care

## 2016-01-30 ENCOUNTER — Ambulatory Visit (INDEPENDENT_AMBULATORY_CARE_PROVIDER_SITE_OTHER)
Admission: RE | Admit: 2016-01-30 | Discharge: 2016-01-30 | Disposition: A | Discharge status: Home / Self Care | Payer: Medicare Other | Source: Ambulatory Visit | Attending: Internal Medicine | Admitting: Internal Medicine

## 2016-01-30 ENCOUNTER — Ambulatory Visit (INDEPENDENT_AMBULATORY_CARE_PROVIDER_SITE_OTHER): Payer: Medicare Other | Admitting: Internal Medicine

## 2016-01-30 ENCOUNTER — Encounter: Payer: Self-pay | Admitting: Internal Medicine

## 2016-01-30 VITALS — BP 120/78 | HR 70 | Temp 98.6°F | Wt 198.0 lb

## 2016-01-30 DIAGNOSIS — M79672 Pain in left foot: Secondary | ICD-10-CM

## 2016-01-30 NOTE — Patient Instructions (Signed)

## 2016-01-30 NOTE — Progress Notes (Signed)
Subjective:    Patient ID: Stephanie [name], female    DOB: 26-Oct-1950, 65 y.o.   MRN: [id number]  HPI  Pt presents to the clinic today with c/o pain in her left pinky toe. This started 4-6 weeks ago, after she hit her toe on a door. It has been sore intermittently, but she reports she noticed increased redness and swelling of the are 10 days ago. It is tender at times when she walks, but she reports it seems to hurt worse at night. It sometimes feel like there is an electrical shock going through that toe. She denies numbness or tingling. She has iced it a few times. She has not tried anything OTC.  Review of Systems      Past Medical History  Diagnosis Date  . Restless leg     Current Outpatient Prescriptions  Medication Sig Dispense Refill  . buPROPion (WELLBUTRIN XL) 150 MG 24 hr tablet TAKE 1 TABLET (150 MG TOTAL) BY MOUTH DAILY. 30 tablet 3  . calcium citrate-vitamin D (CITRACAL+D) 315-200 MG-UNIT per tablet Take by mouth.    . metoprolol succinate (TOPROL-XL) 50 MG 24 hr tablet TAKE 1 TABLET (50 MG TOTAL) BY MOUTH DAILY. TAKE WITH OR IMMEDIATELY FOLLOWING A MEAL. 30 tablet 5  . rOPINIRole (REQUIP) 0.25 MG tablet Take 1 tablet (0.25 mg total) by mouth at bedtime. 30 tablet 3  . sertraline (ZOLOFT) 50 MG tablet Take 1 tablet (50 mg total) by mouth daily. 30 tablet 5   No current facility-administered medications for this visit.    Allergies  Allergen Reactions  . Codeine   . Pyridium  [Phenazopyridine Hcl]     No family history on file.  Social History   Social History  . Marital Status: Single    Spouse Name: N/A  . Number of Children: N/A  . Years of Education: N/A   Occupational History  . Not on file.   Social History Main Topics  . Smoking status: Current Every Day Smoker -- 1.00 packs/day    Types: Cigarettes  . Smokeless tobacco: Not on file  . Alcohol Use: 0.0 oz/week    0 Standard drinks or equivalent per week     Comment: social  . Drug Use: Not on  file  . Sexual Activity: Not on file   Other Topics Concern  . Not on file   Social History Narrative   Divorced   Just moved from [place].   Originally from [place].   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     Constitutional: Denies fever, malaise, fatigue, headache or abrupt weight changes.  Musculoskeletal: Pt reports pain in left pinky toe. Denies decrease in range of motion, difficulty with gait, muscle pain.  Skin: Pt reports redness of left pinky toe. Denies rashes, lesions or ulcercations.  Neurological: Denies dizziness, difficulty with memory, difficulty with speech or problems with balance and coordination.    No other specific complaints in a complete review of systems (except as listed in HPI above).  Objective:   Physical Exam     BP 120/78 mmHg  Pulse 70  Temp(Src) 98.6 F (37 C) (Oral)  Wt 198 lb (89.812 kg)  SpO2 98% Wt Readings from Last 3 Encounters:  01/30/16 198 lb (89.812 kg)  08/23/15 183 lb 1.9 oz (83.063 kg)  05/24/15 178 lb 6.4 oz (80.922 kg)    General: Appears her stated age, well developed, well nourished in NAD. Skin: Warm,  dry and intact. Redness noted of left pinky toe. Musculoskeletal: Decreased flexion, extension and abduction of the left pinky toe. Pain with palpation of the metatarsal just adjacent to the pinky toe. Neurological: Alert and oriented. Sensation intact to BLE.   BMET    Component Value Date/Time   NA 144 08/19/2015 0843   K 4.5 08/19/2015 0843   CL 108 08/19/2015 0843   CO2 29 08/19/2015 0843   GLUCOSE 105* 08/19/2015 0843   BUN 17 08/19/2015 0843   CREATININE 0.99 08/19/2015 0843   CALCIUM 9.1 08/19/2015 0843    Lipid Panel     Component Value Date/Time   CHOL 165 08/19/2015 0843   TRIG 82.0 08/19/2015 0843   HDL 50.40 08/19/2015 0843   CHOLHDL 3 08/19/2015 0843   VLDL 16.4 08/19/2015 0843   LDLCALC 98 08/19/2015 0843    CBC No results found  for: WBC, RBC, HGB, HCT, PLT, MCV, MCH, MCHC, RDW, LYMPHSABS, MONOABS, EOSABS, BASOSABS  Hgb A1C No results found for: HGBA1C  Assessment & Plan:   Left foot pain:  Will obtain xray today to r/o displaced fracture Information given on [facility]  Will follow up after xray is back

## 2016-01-30 NOTE — Progress Notes (Signed)
Pre visit review using our clinic review tool, if applicable. No additional management support is needed unless otherwise documented below in the visit note. 

## 2016-02-12 ENCOUNTER — Ambulatory Visit (INDEPENDENT_AMBULATORY_CARE_PROVIDER_SITE_OTHER): Payer: Medicare Other | Admitting: Primary Care

## 2016-02-12 ENCOUNTER — Encounter: Payer: Self-pay | Admitting: Primary Care

## 2016-02-12 VITALS — BP 120/76 | HR 66 | Temp 97.1°F | Ht 66.0 in | Wt 196.8 lb

## 2016-02-12 DIAGNOSIS — G2581 Restless legs syndrome: Secondary | ICD-10-CM

## 2016-02-12 DIAGNOSIS — F411 Generalized anxiety disorder: Secondary | ICD-10-CM | POA: Diagnosis not present

## 2016-02-12 DIAGNOSIS — I10 Essential (primary) hypertension: Secondary | ICD-10-CM

## 2016-02-12 DIAGNOSIS — Z720 Tobacco use: Secondary | ICD-10-CM

## 2016-02-12 MED ORDER — ROPINIROLE HCL 0.5 MG PO TABS: ORAL_TABLET | ORAL | Status: DC

## 2016-02-12 MED ORDER — SERTRALINE HCL 50 MG PO TABS: 50.0000 mg | ORAL_TABLET | Freq: Every day | ORAL | Status: DC

## 2016-02-12 MED ORDER — BUPROPION HCL ER (XL) 300 MG PO TB24: 300.0000 mg | ORAL_TABLET | Freq: Every day | ORAL | Status: DC

## 2016-02-12 MED ORDER — METOPROLOL SUCCINATE ER 50 MG PO TB24: ORAL_TABLET | ORAL | Status: DC

## 2016-02-12 NOTE — Progress Notes (Signed)
Pre visit review using our clinic review tool, if applicable. No additional management support is needed unless otherwise documented below in the visit note. 

## 2016-02-12 NOTE — Assessment & Plan Note (Signed)
No improvement with Wellbutrin will increase dose to 300 mg today as she cannot afford Chantix and could not tolerate the nicotine patches.

## 2016-02-12 NOTE — Assessment & Plan Note (Signed)
Mild improvement with ropinirole, but did not take for very long or consistently. Increased dose today and encouraged her to take consistently. She is to report back if no improvement/symptoms persist.

## 2016-02-12 NOTE — Progress Notes (Signed)
Subjective:    Patient ID: Stephanie [name], female    DOB: October 08, 1950, 65 y.o.   MRN: [id number]  HPI  [name] is a 65 year old female who presents today with a chief complaint of lower extremity cramping. She presented in December 2016 with complaints of restless leg and discomfort to her lower extremities. She was once managed on Requip in the past which provided improvement so refills were provided last visit.   Since her last visit she has continued to notice lower extremity disomfort. She will experience a discomfort/numbness to her left lower extremity at night in bed and also when watching TV. Overall her symptoms have progressed and she is now experiencing these symptoms once weekly. She will constantly have to move her legs in order to get comfortable. The discomfort will last several hours. She noticed minor improvement with the ropinirole 0.25 mg, but hasn't taken it in a while. She did not take the Ropinirole consistently.  She did experience lower extremity swelling and was evaluated 1 week ago. She was determined to have a normal exam. The patient had been walking a lot several days prior.   2) Tobacco Abuse: Currently managed on Wellbutrin XL 150 mg tablets for depression. She is currently smoking 1 PPD. She had improvement in her smoking with the OTC nicotine patches, but they caused her skin to break out so she was unable to continue their use. She was prescribed Chantix in the past, but could not afford. She is motivated to quit, but requires help.  Review of Systems  Respiratory: Negative for shortness of breath.   Cardiovascular: Negative for chest pain.  Musculoskeletal:       Lower extremity pain  Neurological:       Lower extremity pain with occasional numbness.       Past Medical History  Diagnosis Date  . Restless leg      Social History   Social History  . Marital Status: Single    Spouse Name: N/A  . Number of Children: N/A  . Years of Education: N/A    Occupational History  . Not on file.   Social History Main Topics  . Smoking status: Current Every Day Smoker -- 1.00 packs/day    Types: Cigarettes  . Smokeless tobacco: Not on file  . Alcohol Use: 0.0 oz/week    0 Standard drinks or equivalent per week     Comment: social  . Drug Use: Not on file  . Sexual Activity: Not on file   Other Topics Concern  . Not on file   Social History Narrative   Divorced   Just moved from [place].   Originally from [place].   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    No past surgical history on file.  No family history on file.  Allergies  Allergen Reactions  . Codeine   . Pyridium  [Phenazopyridine Hcl]     Current Outpatient Prescriptions on File Prior to Visit  Medication Sig Dispense Refill  . calcium citrate-vitamin D (CITRACAL+D) 315-200 MG-UNIT per tablet Take by mouth.     No current facility-administered medications on file prior to visit.    BP 120/76 mmHg  Pulse 66  Temp(Src) 97.1 F (36.2 C) (Oral)  Ht 5\' 6"  (1.676 m)  Wt 196 lb 12.8 oz (89.268 kg)  BMI 31.78 kg/m2  SpO2 97%    Objective:   Physical Exam  Constitutional: She appears well-nourished.  Cardiovascular: Normal rate and regular rhythm.   No lower extremity edema, good DP and PT pulses. Negative Homan's sign, no calf tenderness.  Pulmonary/Chest: Effort normal and breath sounds normal.  Neurological: She displays normal reflexes. No cranial nerve deficit.          Assessment & Plan:

## 2016-02-12 NOTE — Patient Instructions (Signed)
I've increased your Bupropion from 150 mg to 300 mg. Please update me in 1 month in regards to the increased dose and your tobacco abuse.  I've increased your Ropinirole from 0.25 mg to 0.5 mg. Take 1 tablet by mouth 1 to 3 hours prior to bedtime. Do this every day for at least 2 weeks in order to experience the full effect. Please update me on the effects or lack of effect with this medication.  It was a pleasure to see you today!

## 2016-02-21 ENCOUNTER — Ambulatory Visit: Payer: Medicare Other | Admitting: Primary Care

## 2016-02-26 ENCOUNTER — Telehealth: Payer: Self-pay | Admitting: Primary Care

## 2016-02-26 NOTE — Telephone Encounter (Signed)
Spoke to pt who states the increase in med has helped her leg pain. She states she can tell "some difference" with the cravings since starting wellbutrin, but thinks is "not in her system good yet."

## 2016-02-26 NOTE — Telephone Encounter (Signed)
-----   Message from Pleas Koch, NP sent at 02/12/2016  4:12 PM EDT ----- Regarding: Restless Leg and Tobacco Abuse Please check on Stephanie [name]. 1. How is her leg discomfort since we increased the dose of her Ropinirple?  2. Has the increased dose of Wellbutrin helped with tobacco abuse?

## 2016-02-26 NOTE — Telephone Encounter (Signed)
Noted  

## 2016-06-08 ENCOUNTER — Encounter: Payer: Self-pay | Admitting: Primary Care

## 2016-06-08 ENCOUNTER — Ambulatory Visit (INDEPENDENT_AMBULATORY_CARE_PROVIDER_SITE_OTHER)
Admission: RE | Admit: 2016-06-08 | Discharge: 2016-06-08 | Disposition: A | Discharge status: Home / Self Care | Payer: Medicare Other | Source: Ambulatory Visit | Attending: Primary Care | Admitting: Primary Care

## 2016-06-08 ENCOUNTER — Ambulatory Visit (INDEPENDENT_AMBULATORY_CARE_PROVIDER_SITE_OTHER): Payer: Medicare Other | Admitting: Primary Care

## 2016-06-08 VITALS — BP 120/72 | HR 71 | Temp 98.1°F | Ht 66.0 in | Wt 201.8 lb

## 2016-06-08 DIAGNOSIS — F418 Other specified anxiety disorders: Secondary | ICD-10-CM

## 2016-06-08 DIAGNOSIS — M25562 Pain in left knee: Secondary | ICD-10-CM

## 2016-06-08 DIAGNOSIS — G8929 Other chronic pain: Secondary | ICD-10-CM | POA: Diagnosis not present

## 2016-06-08 DIAGNOSIS — Z720 Tobacco use: Secondary | ICD-10-CM

## 2016-06-08 DIAGNOSIS — S8992XA Unspecified injury of left lower leg, initial encounter: Secondary | ICD-10-CM | POA: Diagnosis not present

## 2016-06-08 DIAGNOSIS — F419 Anxiety disorder, unspecified: Secondary | ICD-10-CM

## 2016-06-08 DIAGNOSIS — F329 Major depressive disorder, single episode, unspecified: Secondary | ICD-10-CM

## 2016-06-08 DIAGNOSIS — F32A Depression, unspecified: Secondary | ICD-10-CM

## 2016-06-08 DIAGNOSIS — M25569 Pain in unspecified knee: Secondary | ICD-10-CM

## 2016-06-08 MED ORDER — BUPROPION HCL ER (XL) 300 MG PO TB24: 300.0000 mg | ORAL_TABLET | Freq: Every day | ORAL | 1 refills | Status: DC

## 2016-06-08 NOTE — Assessment & Plan Note (Signed)
Long term history of chronic knee pain.  No recent injury or trauma, but overall noticed decrease in strength. Plain films pending today. Patient would like to see Sports Med and will schedule an appointment.

## 2016-06-08 NOTE — Assessment & Plan Note (Signed)
Overall much improved with addition of Wellbutrin. Continue Zoloft and Wellbutrin. Denies SI/HI. Refill provided.

## 2016-06-08 NOTE — Progress Notes (Signed)
Subjective:    Patient ID: Stephanie [name], female    DOB: 1951-07-22, 65 y.o.   MRN: [id number]  HPI  Stephanie [name] is a 65 year old female who presents today with multiple complaints.  1) Nevus: Located to the right shoulder which has been present years. Over the past 2-3 weeks she noticed a growth over her nevus with increase in size (up to a dime). She applied neosporin cream for several days. The spot crusted off 5 days ago. The original mole is at baseline today. Denies pain, itching, changes since. She is a  2) Knee Pain: Chronic knee pain since childhood. She fell 1-2 weeks ago while attempted to stand up getting out of bed. She twisted her left ankle. She does experience tingling intermittently to her left knee. She has noticed gradual decrease in strength to her left knee over the past 1-2 years. No recent imaging. Her pain is located to the generalized knee for which she describes as "shooting". This occurs daily.   3) Tobacco Abuse: Currently managed on Wellbutrin XL 300 mg and Sertraline 50 mg. She has noticed improvement in mood swings, anxiety, and depression since addition of Wellbutrin XL 300 mg for tobacco abuse. She is not ready to quit smoking. The Wellbutrin does not help to reduce cravings. She's failed treatment with OTC products and cannot afford Chantix.  Review of Systems  Constitutional: Negative for unexpected weight change.  Respiratory: Negative for shortness of breath.   Cardiovascular: Negative for chest pain.  Musculoskeletal:       Left knee pain, chronic  Skin:       Changes to Nevus, now baseline.       Past Medical History:  Diagnosis Date  . Restless leg      Social History   Social History  . Marital status: Single    Spouse name: N/A  . Number of children: N/A  . Years of education: N/A   Occupational History  . Not on file.   Social History Main Topics  . Smoking status: Current Every Day Smoker    Packs/day: 1.00    Types:  Cigarettes  . Smokeless tobacco: Not on file  . Alcohol use 0.0 oz/week     Comment: social  . Drug use: Unknown  . Sexual activity: Not on file   Other Topics Concern  . Not on file   Social History Narrative   Divorced   Just moved from [place].   Originally from [place].   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    No past surgical history on file.  No family history on file.  Allergies  Allergen Reactions  . Codeine   . Pyridium  [Phenazopyridine Hcl]     Current Outpatient Prescriptions on File Prior to Visit  Medication Sig Dispense Refill  . calcium citrate-vitamin D (CITRACAL+D) 315-200 MG-UNIT per tablet Take by mouth.    . metoprolol succinate (TOPROL-XL) 50 MG 24 hr tablet TAKE 1 TABLET (50 MG TOTAL) BY MOUTH DAILY. TAKE WITH OR IMMEDIATELY FOLLOWING A MEAL. 90 tablet 3  . rOPINIRole (REQUIP) 0.5 MG tablet Take 1 tablet by mouth 1 to 3 hours prior to bedtime. 30 tablet 3  . sertraline (ZOLOFT) 50 MG tablet Take 1 tablet (50 mg total) by mouth daily. 90 tablet 3   No current facility-administered medications on file prior to visit.     BP 120/72   Pulse 71   Temp  98.1 F (36.7 C) (Oral)   Ht 5\' 6"  (1.676 m)   Wt 201 lb 12.8 oz (91.5 kg)   SpO2 98%   BMI 32.57 kg/m    Objective:   Physical Exam  Constitutional: She appears well-nourished.  Neck: Neck supple.  Cardiovascular: Normal rate and regular rhythm.   Pulmonary/Chest: Effort normal and breath sounds normal.  Musculoskeletal:       Left knee: She exhibits decreased range of motion. She exhibits no swelling and normal alignment.  Slight decrease in strength to left knee.  Skin: Skin is warm and dry.  1/2 cm circular, dark brown, non suspicious nevus to right shoulder. Presence of recent skin removal surrounding nevus. Does not appear infected.          Assessment & Plan:  Nevus:  Long history of for years. Recent growth surrounding  nevus that has since resolved. Nevus today appears stable. Not suspicious. Will continue to monitor.   Sheral Flow, NP

## 2016-06-08 NOTE — Assessment & Plan Note (Signed)
No improvement with Wellbutrin XL 300 mg in regards to tobacco abuse. Not ready to quit.

## 2016-06-08 NOTE — Patient Instructions (Signed)
Complete xray(s) prior to leaving today. I will notify you of your results once received.  Schedule an appointment with Dr. Lorelei Pont for further evaluation of your knee.  Wear the knee brace as discussed for stability. Elevate your knee at night for any swelling.  Please schedule a physical with me in December. You may also schedule a lab only appointment 3-4 days prior. We will discuss your lab results in detail during your physical.  It was a pleasure to see you today!

## 2016-06-08 NOTE — Progress Notes (Signed)
Pre visit review using our clinic review tool, if applicable. No additional management support is needed unless otherwise documented below in the visit note. 

## 2016-06-15 ENCOUNTER — Encounter: Payer: Self-pay | Admitting: Family Medicine

## 2016-06-15 ENCOUNTER — Ambulatory Visit (INDEPENDENT_AMBULATORY_CARE_PROVIDER_SITE_OTHER): Payer: Medicare Other | Admitting: Family Medicine

## 2016-06-15 VITALS — BP 126/84 | HR 79 | Temp 98.5°F | Ht 66.0 in | Wt 201.2 lb

## 2016-06-15 DIAGNOSIS — M25562 Pain in left knee: Secondary | ICD-10-CM | POA: Diagnosis not present

## 2016-06-15 DIAGNOSIS — IMO0001 Reserved for inherently not codable concepts without codable children: Secondary | ICD-10-CM

## 2016-06-15 DIAGNOSIS — W19XXXA Unspecified fall, initial encounter: Secondary | ICD-10-CM | POA: Diagnosis not present

## 2016-06-15 NOTE — Progress Notes (Signed)
Dr. Frederico Hamman T. Samie Reasons, MD, [facility] [place], [address] Phone: [phone] Fax: [phone]  06/15/2016  Patient: Stephanie [name], MRN: [id number], DOB: 1951-04-16, 65 y.o.  Primary Physician:  Sheral Flow, NP   Chief Complaint  Patient presents with  . Knee Pain    Left   Subjective:   Stephanie [name] is a 81 y.o. very pleasant female patient who presents with the following:  Pleasant 64 year old woman who presents with ongoing knee pain for at least the last 1-2 years.  She is having mechanical symptoms and functional giving way of her knee, and she has fallen  Many times  Over the last 1-2 years.  She does not feel safer stable on her knee, and it is buckling very frequently.  She has done physical therapy previously for balance training, but this does not seem to help very much.  She has done multiple medications including  NSAIDs, Tylenol, and she has a knee brace, none of which are really helping her very much.  She is having pain primarily on the medial aspect of her knee.  She has never had significant intervention or surgery on this knee previously.  Past Medical History, Surgical History, Social History, Family History, Problem List, Medications, and Allergies have been reviewed and updated if relevant.  Patient Active Problem List   Diagnosis Date Noted  . Chronic knee pain 06/08/2016  . Medicare annual wellness visit, initial 08/23/2015  . Restless leg syndrome 08/23/2015  . Essential hypertension 05/24/2015  . Anxiety and depression 05/24/2015  . Tobacco abuse 05/24/2015  . Atrial fibrillation ([place]) 05/24/2015    Past Medical History:  Diagnosis Date  . Restless leg     No past surgical history on file.  Social History   Social History  . Marital status: Single    Spouse name: N/A  . Number of children: N/A  . Years of education: N/A   Occupational History  . Not on file.    Social History Main Topics  . Smoking status: Current Every Day Smoker    Packs/day: 1.00    Types: Cigarettes  . Smokeless tobacco: Not on file  . Alcohol use 0.0 oz/week     Comment: social  . Drug use: Unknown  . Sexual activity: Not on file   Other Topics Concern  . Not on file   Social History Narrative   Divorced   Just moved from [place].   Originally from [place] [place].   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    No family history on file.  Allergies  Allergen Reactions  . Codeine   . Pyridium  [Phenazopyridine Hcl]     Medication list reviewed and updated in full in [place].  GEN: No fevers, chills. Nontoxic. Primarily MSK c/o today. MSK: Detailed in the HPI GI: tolerating PO intake without difficulty Neuro: No numbness, parasthesias, or tingling associated. Otherwise the pertinent positives of the ROS are noted above.   Objective:   BP 126/84   Pulse 79   Temp 98.5 F (36.9 C) (Oral)   Ht 5\' 6"  (1.676 m)   Wt 201 lb 4 oz (91.3 kg)   BMI 32.48 kg/m    GEN: WDWN, NAD, Non-toxic, Alert & Oriented x 3 HEENT: Atraumatic, Normocephalic.  Ears and Nose: No external deformity. EXTR: No clubbing/cyanosis/edema NEURO: antalgic gait PSYCH: Normally interactive. Conversant. Not depressed or anxious appearing.  Calm demeanor.    Left knee: Lacks extension of 2.  Flexion to 115.  Nontender on the patellar tendon or quadriceps tendon.  No significant patellar facet tenderness.  Notable tenderness along the medial joint line.  Lateral joint line is nontender.  ACL, PCL, LCL, and MCL are all stable.  Flexion pinch testing is positive.  McMurray's testing is positive.  Radiology: Dg Knee Ap/lat W/sunrise Left  Result Date: 06/09/2016 CLINICAL DATA:  Chronic LEFT knee pain.  Fall 2 weeks ago. EXAM: LEFT KNEE 3 VIEWS COMPARISON:  None. FINDINGS: Moderate degenerative change particularly the medial  compartment and patellofemoral compartment. No worrisome osseous lesions. No foreign body. IMPRESSION: Definitive change as described. Electronically Signed   By: Staci Righter M.D.   On: 06/09/2016 08:46   The radiological images were independently reviewed by myself in the office and results were reviewed with the patient. My independent interpretation of images:  Mild to moderate osteoarthritic change, tricompartmental in character.  Greater changes are noted in the medial compartment, but at most is moderate in nature. Electronically Signed  By: Owens Loffler, MD On: 06/16/2016 9:00 AM   Assessment and Plan:   Left knee pain - Plan: MR Knee Left  Wo Contrast  Falling, initial encounter - Plan: MR Knee Left  Wo Contrast  >25 minutes spent in face to face time with patient, >50% spent in counselling or coordination of care  1-2 years of left-sided knee pain with failure of conservative management including NSAIDs, Tylenol, physical therapy, bracing with mechanical symptoms and  Functional giving way of the patient's knee resulting in significant falls.  The patient would be best suited to define any internal derangement such as large meniscal tear that is resulting in her mechanical symptoms to decrease her risk of falling and sustaining a large-scale injury.  Obtain an MRI of the left knee to evaluate for internal derangement including medial meniscal tear  Or cruciate ligament disruption.  Follow-up: depending on MRI  Orders Placed This Encounter  Procedures  . MR Knee Left  Wo Contrast    Signed,  Breon Rehm T. Klaryssa Fauth, MD   Patient's Medications  New Prescriptions   No medications on file  Previous Medications   BUPROPION (WELLBUTRIN XL) 300 MG 24 HR TABLET    Take 1 tablet (300 mg total) by mouth daily.   CALCIUM CITRATE-VITAMIN D (CITRACAL+D) 315-200 MG-UNIT PER TABLET    Take by mouth.   METOPROLOL SUCCINATE (TOPROL-XL) 50 MG 24 HR TABLET    TAKE 1 TABLET (50 MG TOTAL) BY MOUTH  DAILY. TAKE WITH OR IMMEDIATELY FOLLOWING A MEAL.   ROPINIROLE (REQUIP) 0.5 MG TABLET    Take 1 tablet by mouth 1 to 3 hours prior to bedtime.   SERTRALINE (ZOLOFT) 50 MG TABLET    Take 1 tablet (50 mg total) by mouth daily.  Modified Medications   No medications on file  Discontinued Medications   No medications on file

## 2016-06-15 NOTE — Progress Notes (Signed)
Pre visit review using our clinic review tool, if applicable. No additional management support is needed unless otherwise documented below in the visit note. 

## 2016-06-15 NOTE — Patient Instructions (Signed)

## 2016-06-24 ENCOUNTER — Ambulatory Visit
Admission: RE | Admit: 2016-06-24 | Discharge: 2016-06-24 | Disposition: A | Discharge status: Home / Self Care | Payer: Medicare Other | Source: Ambulatory Visit | Attending: Family Medicine | Admitting: Family Medicine

## 2016-06-24 ENCOUNTER — Other Ambulatory Visit: Payer: Self-pay | Admitting: Family Medicine

## 2016-06-24 DIAGNOSIS — IMO0001 Reserved for inherently not codable concepts without codable children: Secondary | ICD-10-CM

## 2016-06-24 DIAGNOSIS — M1712 Unilateral primary osteoarthritis, left knee: Secondary | ICD-10-CM | POA: Diagnosis not present

## 2016-06-24 DIAGNOSIS — M25562 Pain in left knee: Secondary | ICD-10-CM

## 2016-06-24 DIAGNOSIS — S83262A Peripheral tear of lateral meniscus, current injury, left knee, initial encounter: Secondary | ICD-10-CM | POA: Insufficient documentation

## 2016-06-24 DIAGNOSIS — S83282A Other tear of lateral meniscus, current injury, left knee, initial encounter: Secondary | ICD-10-CM

## 2016-06-24 DIAGNOSIS — W19XXXA Unspecified fall, initial encounter: Secondary | ICD-10-CM | POA: Diagnosis not present

## 2016-06-29 DIAGNOSIS — S83282A Other tear of lateral meniscus, current injury, left knee, initial encounter: Secondary | ICD-10-CM | POA: Diagnosis not present

## 2016-07-16 DIAGNOSIS — S83242A Other tear of medial meniscus, current injury, left knee, initial encounter: Secondary | ICD-10-CM | POA: Diagnosis not present

## 2016-07-16 DIAGNOSIS — M23252 Derangement of posterior horn of lateral meniscus due to old tear or injury, left knee: Secondary | ICD-10-CM | POA: Diagnosis not present

## 2016-07-16 DIAGNOSIS — M94262 Chondromalacia, left knee: Secondary | ICD-10-CM | POA: Diagnosis not present

## 2016-07-16 DIAGNOSIS — M2342 Loose body in knee, left knee: Secondary | ICD-10-CM | POA: Diagnosis not present

## 2016-07-31 DIAGNOSIS — S83242D Other tear of medial meniscus, current injury, left knee, subsequent encounter: Secondary | ICD-10-CM | POA: Diagnosis not present

## 2016-07-31 DIAGNOSIS — S83282D Other tear of lateral meniscus, current injury, left knee, subsequent encounter: Secondary | ICD-10-CM | POA: Diagnosis not present

## 2016-08-11 ENCOUNTER — Telehealth: Payer: Self-pay | Admitting: Primary Care

## 2016-08-11 NOTE — Telephone Encounter (Signed)
Pt has appt with Allie Bossier NP on 08/12/16 at 9:45. This team health note did not come to my desktop but did go to portal. FYI to Allie Bossier NP and Lorenda Peck RN.

## 2016-08-11 NOTE — Telephone Encounter (Signed)
Noted  

## 2016-08-11 NOTE — Telephone Encounter (Signed)
[facility]  Patient Name: Stephanie [name]  DOB: 07/28/1951    Initial Comment Pt has a cold and has a knot on her arm underneath and she is not sure- blood clots    Nurse Assessment  Nurse: Wayne Sever, RN, Tillie Rung Date/Time (Eastern Time): 08/11/2016 10:20:12 AM  Confirm and document reason for call. If symptomatic, describe symptoms. You must click the next button to save text entered. ---Caller states she has a knot on her arm and it went down a bit with ice. She states she also has a cold. She wants someone to look at her knot and check her cold. She states the knot is on her left arm. She states it's between the wrist and elbow.  Does the patient have any new or worsening symptoms? ---Yes  Will a triage be completed? ---Yes  Related visit to physician within the last 2 weeks? ---No  Does the PT have any chronic conditions? (i.e. diabetes, asthma, etc.) ---Yes  List chronic conditions. ---HTN, Blood Clots  Is this a behavioral health or substance abuse call? ---No     Guidelines    Guideline Title Affirmed Question Affirmed Notes  Skin Lump or Localized Swelling Looks like a boil, infected sore, deep ulcer or other infected rash    Final Disposition User   See Physician within [place], RN, Tillie Rung    Comments  Scheduled with Alma Friendly tomorrow 11/21 at 0945a   Referrals  REFERRED TO PCP OFFICE   Disagree/Comply: Leta Baptist

## 2016-08-12 ENCOUNTER — Encounter: Payer: Self-pay | Admitting: Primary Care

## 2016-08-12 ENCOUNTER — Ambulatory Visit (INDEPENDENT_AMBULATORY_CARE_PROVIDER_SITE_OTHER): Payer: Medicare Other | Admitting: Primary Care

## 2016-08-12 VITALS — BP 130/84 | HR 70 | Temp 98.0°F | Ht 66.0 in | Wt 202.4 lb

## 2016-08-12 DIAGNOSIS — J069 Acute upper respiratory infection, unspecified: Secondary | ICD-10-CM | POA: Diagnosis not present

## 2016-08-12 MED ORDER — BENZONATATE 200 MG PO CAPS: 200.0000 mg | ORAL_CAPSULE | Freq: Three times a day (TID) | ORAL | 0 refills | Status: DC | PRN

## 2016-08-12 NOTE — Patient Instructions (Signed)
Your symptoms are representative of a viral illness which will resolve on its own over time. Our goal is to treat your symptoms in order to aid your body in the healing process and to make you more comfortable.   You may take Benzonatate capsules for cough. Take 1 capsule by mouth three times daily as needed for cough.  Please notify me if you develop persistent fevers of 101, start coughing up green mucous, notice increased fatigue or weakness, or feel worse.  Increase consumption of water intake and rest.  It was a pleasure to see you today!   Upper Respiratory Infection, Adult Most upper respiratory infections (URIs) are a viral infection of the air passages leading to the lungs. A URI affects the nose, throat, and upper air passages. The most common type of URI is nasopharyngitis and is typically referred to as "the common cold." URIs run their course and usually go away on their own. Most of the time, a URI does not require medical attention, but sometimes a bacterial infection in the upper airways can follow a viral infection. This is called a secondary infection. Sinus and middle ear infections are common types of secondary upper respiratory infections. Bacterial pneumonia can also complicate a URI. A URI can worsen asthma and chronic obstructive pulmonary disease (COPD). Sometimes, these complications can require emergency medical care and may be life threatening. What are the causes? Almost all URIs are caused by viruses. A virus is a type of germ and can spread from one person to another. What increases the risk? You may be at risk for a URI if:  You smoke.  You have chronic heart or lung disease.  You have a weakened defense (immune) system.  You are very young or very old.  You have nasal allergies or asthma.  You work in crowded or poorly ventilated areas.  You work in health care facilities or schools. What are the signs or symptoms? Symptoms typically develop 2-3 days  after you come in contact with a cold virus. Most viral URIs last 7-10 days. However, viral URIs from the influenza virus (flu virus) can last 14-18 days and are typically more severe. Symptoms may include:  Runny or stuffy (congested) nose.  Sneezing.  Cough.  Sore throat.  Headache.  Fatigue.  Fever.  Loss of appetite.  Pain in your forehead, behind your eyes, and over your cheekbones (sinus pain).  Muscle aches. How is this diagnosed? Your health care provider may diagnose a URI by:  Physical exam.  Tests to check that your symptoms are not due to another condition such as:  Strep throat.  Sinusitis.  Pneumonia.  Asthma. How is this treated? A URI goes away on its own with time. It cannot be cured with medicines, but medicines may be prescribed or recommended to relieve symptoms. Medicines may help:  Reduce your fever.  Reduce your cough.  Relieve nasal congestion. Follow these instructions at home:  Take medicines only as directed by your health care provider.  Gargle warm saltwater or take cough drops to comfort your throat as directed by your health care provider.  Use a warm mist humidifier or inhale steam from a shower to increase air moisture. This may make it easier to breathe.  Drink enough fluid to keep your urine clear or pale yellow.  Eat soups and other clear broths and maintain good nutrition.  Rest as needed.  Return to work when your temperature has returned to normal or as your health  care provider advises. You may need to stay home longer to avoid infecting others. You can also use a face mask and careful hand washing to prevent spread of the virus.  Increase the usage of your inhaler if you have asthma.  Do not use any tobacco products, including cigarettes, chewing tobacco, or electronic cigarettes. If you need help quitting, ask your health care provider. How is this prevented? The best way to protect yourself from getting a cold  is to practice good hygiene.  Avoid oral or hand contact with people with cold symptoms.  Wash your hands often if contact occurs. There is no clear evidence that vitamin C, vitamin E, echinacea, or exercise reduces the chance of developing a cold. However, it is always recommended to get plenty of rest, exercise, and practice good nutrition. Contact a health care provider if:  You are getting worse rather than better.  Your symptoms are not controlled by medicine.  You have chills.  You have worsening shortness of breath.  You have brown or red mucus.  You have yellow or brown nasal discharge.  You have pain in your face, especially when you bend forward.  You have a fever.  You have swollen neck glands.  You have pain while swallowing.  You have white areas in the back of your throat. Get help right away if:  You have severe or persistent:  Headache.  Ear pain.  Sinus pain.  Chest pain.  You have chronic lung disease and any of the following:  Wheezing.  Prolonged cough.  Coughing up blood.  A change in your usual mucus.  You have a stiff neck.  You have changes in your:  Vision.  Hearing.  Thinking.  Mood. This information is not intended to replace advice given to you by your health care provider. Make sure you discuss any questions you have with your health care provider. Document Released: 03/03/2001 Document Revised: 05/10/2016 Document Reviewed: 12/13/2013 Elsevier Interactive Patient Education  2017 [employer].

## 2016-08-12 NOTE — Progress Notes (Signed)
Pre visit review using our clinic review tool, if applicable. No additional management support is needed unless otherwise documented below in the visit note. 

## 2016-08-12 NOTE — Progress Notes (Signed)
Subjective:    Patient ID: Stephanie [name], female    DOB: 12-25-1950, 64 y.o.   MRN: [id number]  HPI  Stephanie [name] is a 65 year old female who presents today with multiple complaints.  1) Mass: Located to anterior left upper extremity that she noticed 1 week ago. She underwent knee surgery on October 26th and had an IV in that same location. The IV site did turn "black and blue" after they removed the IV line. She denies erythema, swelling, fevers. She does have a history of DVT to the right lower extremity and is concerned for a blood clot.  2) Cough: Also with nasal congestion, headache, chills, sneezing. Her symptoms began about 9 days ago. Her cough is non productive. She's taken Delsym and Zicam with improvement. Last weekend she felt worse and had to stay in bed for most of the weekend. Today she woke up feeling better. She's been around her granddaughter who was diagnosed with a viral URI around the same time her symptoms developed.   Review of Systems  Constitutional: Positive for chills and fatigue. Negative for fever.  HENT: Positive for congestion. Negative for sinus pressure and sore throat.   Respiratory: Positive for cough.   Neurological: Positive for headaches.       Past Medical History:  Diagnosis Date  . Restless leg      Social History   Social History  . Marital status: Single    Spouse name: N/A  . Number of children: N/A  . Years of education: N/A   Occupational History  . Not on file.   Social History Main Topics  . Smoking status: Current Every Day Smoker    Packs/day: 1.00    Types: Cigarettes  . Smokeless tobacco: Not on file  . Alcohol use 0.0 oz/week     Comment: social  . Drug use: Unknown  . Sexual activity: Not on file   Other Topics Concern  . Not on file   Social History Narrative   Divorced   Just moved from [place].   Originally from [place].   Works as a Radiation protection practitioner for [employer].   Enjoys visiting friends,  walking.    No past surgical history on file.  No family history on file.  Allergies  Allergen Reactions  . Codeine   . Pyridium  [Phenazopyridine Hcl]     Current Outpatient Prescriptions on File Prior to Visit  Medication Sig Dispense Refill  . buPROPion (WELLBUTRIN XL) 300 MG 24 hr tablet Take 1 tablet (300 mg total) by mouth daily. 90 tablet 1  . calcium citrate-vitamin D (CITRACAL+D) 315-200 MG-UNIT per tablet Take by mouth.    . metoprolol succinate (TOPROL-XL) 50 MG 24 hr tablet TAKE 1 TABLET (50 MG TOTAL) BY MOUTH DAILY. TAKE WITH OR IMMEDIATELY FOLLOWING A MEAL. 90 tablet 3  . rOPINIRole (REQUIP) 0.5 MG tablet Take 1 tablet by mouth 1 to 3 hours prior to bedtime. 30 tablet 3  . sertraline (ZOLOFT) 50 MG tablet Take 1 tablet (50 mg total) by mouth daily. 90 tablet 3   No current facility-administered medications on file prior to visit.     BP 130/84   Pulse 70   Temp 98 F (36.7 C) (Oral)   Ht 5\' 6"  (1.676 m)   Wt 202 lb 6.4 oz (91.8 kg)   SpO2 97%   BMI 32.67 kg/m    Objective:   Physical Exam  Constitutional: She appears well-nourished.  HENT:  Right  Ear: Tympanic membrane and ear canal normal.  Left Ear: Tympanic membrane and ear canal normal.  Nose: Right sinus exhibits no maxillary sinus tenderness and no frontal sinus tenderness. Left sinus exhibits no maxillary sinus tenderness and no frontal sinus tenderness.  Mouth/Throat: Oropharynx is clear and moist.  Eyes: Conjunctivae are normal.  Neck: Neck supple.  Cardiovascular: Normal rate and regular rhythm.   Pulmonary/Chest: Effort normal and breath sounds normal. She has no wheezes. She has no rales.  Lymphadenopathy:    She has no cervical adenopathy.  Skin: Skin is warm and dry. No erythema.  Minor mass to left anterior forearm, representing healing irritation to vein from IV site. No s/s of acute infection, no s/s of blood clot.          Assessment & Plan:  URI:  Cough, fatigue, congestion  x 9 days.  Granddaughter with same symptoms, diagnosed with viral URI. Overall feeling better. Exam today with clear lungs, stable vitals, does not appear acutely ill. Suspect viral URI that she is overcoming. Will treat with supportive measures. Discussed use of Mucinex. Rx for Tessalon pearls sent to pharmacy. She will notify us if symptoms become worse or no improvement.  Arm Mass:  Located to left anterior forearm x 1 week. Exam today with evidence of healing vein irritation likely from IV line. The mass follows the vein line. No s/s of acute infection or blood clot. Discussed warm compresses to help with reduction in size.   Stephanie Flow, NP

## 2016-08-23 ENCOUNTER — Other Ambulatory Visit: Payer: Self-pay | Admitting: Primary Care

## 2016-08-23 DIAGNOSIS — R739 Hyperglycemia, unspecified: Secondary | ICD-10-CM

## 2016-08-23 DIAGNOSIS — I10 Essential (primary) hypertension: Secondary | ICD-10-CM

## 2016-08-23 DIAGNOSIS — Z1159 Encounter for screening for other viral diseases: Secondary | ICD-10-CM

## 2016-08-27 ENCOUNTER — Ambulatory Visit (INDEPENDENT_AMBULATORY_CARE_PROVIDER_SITE_OTHER): Payer: Medicare Other

## 2016-08-27 VITALS — BP 126/78 | HR 68 | Temp 97.7°F | Ht 65.5 in | Wt 201.2 lb

## 2016-08-27 DIAGNOSIS — I10 Essential (primary) hypertension: Secondary | ICD-10-CM | POA: Diagnosis not present

## 2016-08-27 DIAGNOSIS — Z23 Encounter for immunization: Secondary | ICD-10-CM

## 2016-08-27 DIAGNOSIS — Z0000 Encounter for general adult medical examination without abnormal findings: Secondary | ICD-10-CM | POA: Diagnosis not present

## 2016-08-27 DIAGNOSIS — Z1159 Encounter for screening for other viral diseases: Secondary | ICD-10-CM | POA: Diagnosis not present

## 2016-08-27 DIAGNOSIS — R739 Hyperglycemia, unspecified: Secondary | ICD-10-CM | POA: Diagnosis not present

## 2016-08-27 LAB — COMPREHENSIVE METABOLIC PANEL
ALT: 13 U/L (ref 0–35)
AST: 17 U/L (ref 0–37)
Albumin: 3.9 g/dL (ref 3.5–5.2)
Alkaline Phosphatase: 75 U/L (ref 39–117)
BUN: 13 mg/dL (ref 6–23)
CHLORIDE: 105 meq/L (ref 96–112)
CO2: 30 meq/L (ref 19–32)
Calcium: 9.1 mg/dL (ref 8.4–10.5)
Creatinine, Ser: 0.92 mg/dL (ref 0.40–1.20)
GFR: 64.63 mL/min (ref >60.00)
GLUCOSE: 92 mg/dL (ref 70–99)
POTASSIUM: 4.3 meq/L (ref 3.5–5.1)
SODIUM: 142 meq/L (ref 135–145)
Total Bilirubin: 0.4 mg/dL (ref 0.2–1.2)
Total Protein: 6.9 g/dL (ref 6.0–8.3)

## 2016-08-27 LAB — HEMOGLOBIN A1C: Hgb A1c MFr Bld: 5.4 % (ref 4.6–6.5)

## 2016-08-27 LAB — LIPID PANEL
CHOL/HDL RATIO: 3
Cholesterol: 178 mg/dL (ref 0–200)
HDL: 54 mg/dL (ref >39.00)
LDL Cholesterol: 101 mg/dL — ABNORMAL HIGH (ref 0–99)
NONHDL: 123.74
TRIGLYCERIDES: 116 mg/dL (ref 0.0–149.0)
VLDL: 23.2 mg/dL (ref 0.0–40.0)

## 2016-08-27 NOTE — Patient Instructions (Signed)
Stephanie [name] , Thank you for taking time to come for your Medicare Wellness Visit. I appreciate your ongoing commitment to your health goals. Please review the following plan we discussed and let me know if I can assist you in the future.   These are the goals we discussed: Goals    . Increase physical activity          Starting 08/27/2016, I will begin walking at least 30 min every other day as tolerated.     . Quit smoking / using tobacco       This is a list of the screening recommended for you and due dates:  Health Maintenance  Topic Date Due  . DEXA scan (bone density measurement)  08/27/2017*  . Shingles Vaccine  08/27/2017*  . Mammogram  08/27/2018*  . Tetanus Vaccine  08/27/2026*  . Colon Cancer Screening  09/22/2023  . Flu Shot  Completed  .  Hepatitis C: One time screening is recommended by Center for Disease Control  (CDC) for  adults born from [birth year] through 1967.   Completed  . Pneumonia vaccines  Completed  *Topic was postponed. The date shown is not the original due date.   Preventive Care for Adults  A healthy lifestyle and preventive care can promote health and wellness. Preventive health guidelines for adults include the following key practices.  . A routine yearly physical is a good way to check with your health care provider about your health and preventive screening. It is a chance to share any concerns and updates on your health and to receive a thorough exam.  . Visit your dentist for a routine exam and preventive care every 6 months. Brush your teeth twice a day and floss once a day. Good oral hygiene prevents tooth decay and gum disease.  . The frequency of eye exams is based on your age, health, family medical history, use  of contact lenses, and other factors. Follow your health care provider's ecommendations for frequency of eye exams.  . Eat a healthy diet. Foods like vegetables, fruits, whole grains, low-fat dairy products, and lean protein foods  contain the nutrients you need without too many calories. Decrease your intake of foods high in solid fats, added sugars, and salt. Eat the right amount of calories for you. Get information about a proper diet from your health care provider, if necessary.  . Regular physical exercise is one of the most important things you can do for your health. Most adults should get at least 150 minutes of moderate-intensity exercise (any activity that increases your heart rate and causes you to sweat) each week. In addition, most adults need muscle-strengthening exercises on 2 or more days a week.  Silver Sneakers may be a benefit available to you. To determine eligibility, you may visit the website: www.silversneakers.com or contact program at [phone] Mon-Fri between 8AM-8PM.   . Maintain a healthy weight. The body mass index (BMI) is a screening tool to identify possible weight problems. It provides an estimate of body fat based on height and weight. Your health care provider can find your BMI and can help you achieve or maintain a healthy weight.   For adults 20 years and older: ? A BMI below 18.5 is considered underweight. ? A BMI of 18.5 to 24.9 is normal. ? A BMI of 25 to 29.9 is considered overweight. ? A BMI of 30 and above is considered obese.   . Maintain normal blood lipids and cholesterol levels by exercising  and minimizing your intake of saturated fat. Eat a balanced diet with plenty of fruit and vegetables. Blood tests for lipids and cholesterol should begin at age 35 and be repeated every 5 years. If your lipid or cholesterol levels are high, you are over 50, or you are at high risk for heart disease, you may need your cholesterol levels checked more frequently. Ongoing high lipid and cholesterol levels should be treated with medicines if diet and exercise are not working.  . If you smoke, find out from your health care provider how to quit. If you do not use tobacco, please do not  start.  . If you choose to drink alcohol, please do not consume more than 2 drinks per day. One drink is considered to be 12 ounces (355 mL) of beer, 5 ounces (148 mL) of wine, or 1.5 ounces (44 mL) of liquor.  . If you are 29-37 years old, ask your health care provider if you should take aspirin to prevent strokes.  . Use sunscreen. Apply sunscreen liberally and repeatedly throughout the day. You should seek shade when your shadow is shorter than you. Protect yourself by wearing long sleeves, pants, a wide-brimmed hat, and sunglasses year round, whenever you are outdoors.  . Once a month, do a whole body skin exam, using a mirror to look at the skin on your back. Tell your health care provider of new moles, moles that have irregular borders, moles that are larger than a pencil eraser, or moles that have changed in shape or color.

## 2016-08-27 NOTE — Progress Notes (Signed)
PCP notes:   Health maintenance:  PCV13 - administered Flu vaccine - administered Hep C screening - completed Mammogram - pt will discuss with PCP Bone density - pt will discuss with PCP Shingles - postponed/insurance Tetanus - postponed/insurance  Abnormal screenings:   Fall risk: hx of fall with injury   Patient concerns:   Pt reports increased frequency of diarrhea.  Nurse concerns:  None  Next PCP appt:   09/02/16 @ 0900

## 2016-08-27 NOTE — Progress Notes (Signed)
Subjective:   Stephanie [name] is a 65 y.o. female who presents for Medicare Annual (Subsequent) preventive examination.  Review of Systems:  N/A Cardiac Risk Factors include: advanced age (>68men, >32 women);smoking/ tobacco exposure;hypertension     Objective:     Vitals: BP 126/78 (BP Location: Left Arm, Patient Position: Sitting, Cuff Size: Large)   Pulse 68   Temp 97.7 F (36.5 C) (Oral)   Ht 5' 5.5" (1.664 m) Comment: no shoes  Wt 201 lb 4 oz (91.3 kg)   SpO2 97%   BMI 32.98 kg/m   Body mass index is 32.98 kg/m.   Tobacco History  Smoking Status  . Current Every Day Smoker  . Packs/day: 1.00  . Years: 1.00  . Types: Cigarettes  Smokeless Tobacco  . Never Used     Ready to quit: No Counseling given: No   Past Medical History:  Diagnosis Date  . Restless leg    Past Surgical History:  Procedure Laterality Date  . TOTAL KNEE ARTHROPLASTY Left 07/16/2016   History reviewed. No pertinent family history. History  Sexual Activity  . Sexual activity: No    Outpatient Encounter Prescriptions as of 08/27/2016  Medication Sig  . calcium citrate-vitamin D (CITRACAL+D) 315-200 MG-UNIT per tablet Take by mouth.  . metoprolol succinate (TOPROL-XL) 50 MG 24 hr tablet TAKE 1 TABLET (50 MG TOTAL) BY MOUTH DAILY. TAKE WITH OR IMMEDIATELY FOLLOWING A MEAL.  Marland Kitchen rOPINIRole (REQUIP) 0.5 MG tablet Take 1 tablet by mouth 1 to 3 hours prior to bedtime. (Patient not taking: Reported on 08/27/2016)  . [DISCONTINUED] benzonatate (TESSALON) 200 MG capsule Take 1 capsule (200 mg total) by mouth 3 (three) times daily as needed for cough.  . [DISCONTINUED] buPROPion (WELLBUTRIN XL) 300 MG 24 hr tablet Take 1 tablet (300 mg total) by mouth daily.  . [DISCONTINUED] sertraline (ZOLOFT) 50 MG tablet Take 1 tablet (50 mg total) by mouth daily.   No facility-administered encounter medications on file as of 08/27/2016.     Activities of Daily Living In your present state of health, do you  have any difficulty performing the following activities: 08/27/2016  Hearing? N  Vision? N  Difficulty concentrating or making decisions? N  Walking or climbing stairs? Y  Dressing or bathing? N  Doing errands, shopping? N  Preparing Food and eating ? N  Using the Toilet? N  In the past six months, have you accidently leaked urine? N  Do you have problems with loss of bowel control? N  Managing your Medications? N  Managing your Finances? N  Housekeeping or managing your Housekeeping? N  Some recent data might be hidden    Patient Care Team: Pleas Koch, NP as PCP - General (Nurse Practitioner)    Assessment:     Hearing Screening   125Hz  250Hz  500Hz  1000Hz  2000Hz  3000Hz  4000Hz  6000Hz  8000Hz   Right ear:   40 40 40  40    Left ear:   40 40 40  40      Visual Acuity Screening   Right eye Left eye Both eyes  Without correction:     With correction: 20/25 20/40-1 20/25    Exercise Activities and Dietary recommendations Current Exercise Habits: The patient does not participate in regular exercise at present, Exercise limited by: None identified  Goals    . Increase physical activity          Starting 08/27/2016, I will begin walking at least 30 min every other day as  tolerated.     . Quit smoking / using tobacco      Fall Risk Fall Risk  08/27/2016 08/23/2015  Falls in the past year? Yes No  Number falls in past yr: 1 -  Injury with Fall? Yes -  Follow up Falls evaluation completed -   Depression Screen PHQ 2/9 Scores 08/27/2016 08/23/2015  PHQ - 2 Score 0 4  PHQ- 9 Score - 12     Cognitive Function MMSE - Mini Mental State Exam 08/27/2016  Orientation to time 5  Orientation to Place 5  Registration 3  Attention/ Calculation 0  Recall 3  Language- name 2 objects 0  Language- repeat 1  Language- follow 3 step command 3  Language- read & follow direction 0  Write a sentence 0  Copy design 0  Total score 20     PLEASE NOTE: A Mini-Cog screen was  completed. Maximum score is 20. A value of 0 denotes this part of [place] MMSE was not completed or the patient failed this part of the Mini-Cog screening.   Mini-Cog Screening Orientation to Time - Max 5 pts Orientation to Place - Max 5 pts Registration - Max 3 pts Recall - Max 3 pts Language Repeat - Max 1 pts Language Follow 3 Step Command - Max 3 pts     Immunization History  Administered Date(s) Administered  . Influenza,inj,Quad PF,36+ Mos 08/23/2015  . Pneumococcal Polysaccharide-23 08/23/2015   Screening Tests Health Maintenance  Topic Date Due  . DEXA SCAN  08/27/2017 (Originally 03/20/2014)  . ZOSTAVAX  08/27/2017 (Originally 03/20/2009)  . MAMMOGRAM  08/27/2018 (Originally 03/21/1999)  . TETANUS/TDAP  08/27/2026 (Originally 03/20/1968)  . COLONOSCOPY  09/22/2023  . INFLUENZA VACCINE  Completed  . Hepatitis C Screening  Completed  . PNA vac Low Risk Adult  Completed      Plan:     I have personally reviewed and addressed the Medicare Annual Wellness questionnaire and have noted the following in the patient's chart:  A. Medical and social history B. Use of alcohol, tobacco or illicit drugs  C. Current medications and supplements D. Functional ability and status E.  Nutritional status F.  Physical activity G. Advance directives H. List of other physicians I.  Hospitalizations, surgeries, and ER visits in previous 12 months J.  [place] to include hearing, vision, cognitive, depression L. Referrals and appointments - none  In addition, I have reviewed and discussed with patient certain preventive protocols, quality metrics, and best practice recommendations. A written personalized care plan for preventive services as well as general preventive health recommendations were provided to patient.  See attached scanned questionnaire for additional information.   Signed,   Lindell Noe, MHA, BS, LPN Health Coach'

## 2016-08-27 NOTE — Progress Notes (Signed)
Pre visit review using our clinic review tool, if applicable. No additional management support is needed unless otherwise documented below in the visit note. 

## 2016-08-28 LAB — HEPATITIS C ANTIBODY: HCV AB: NEGATIVE

## 2016-08-30 NOTE — Progress Notes (Signed)
I reviewed health advisor's note, was available for consultation, and agree with documentation and plan.  

## 2016-08-31 ENCOUNTER — Encounter: Payer: Medicare Other | Admitting: Primary Care

## 2016-09-02 ENCOUNTER — Encounter: Payer: Self-pay | Admitting: Primary Care

## 2016-09-02 ENCOUNTER — Ambulatory Visit (INDEPENDENT_AMBULATORY_CARE_PROVIDER_SITE_OTHER): Payer: Medicare Other | Admitting: Primary Care

## 2016-09-02 VITALS — BP 130/80 | HR 85 | Temp 98.0°F | Wt 202.0 lb

## 2016-09-02 DIAGNOSIS — G2581 Restless legs syndrome: Secondary | ICD-10-CM

## 2016-09-02 DIAGNOSIS — F418 Other specified anxiety disorders: Secondary | ICD-10-CM

## 2016-09-02 DIAGNOSIS — E2839 Other primary ovarian failure: Secondary | ICD-10-CM

## 2016-09-02 DIAGNOSIS — I10 Essential (primary) hypertension: Secondary | ICD-10-CM

## 2016-09-02 DIAGNOSIS — Z23 Encounter for immunization: Secondary | ICD-10-CM

## 2016-09-02 DIAGNOSIS — Z0000 Encounter for general adult medical examination without abnormal findings: Secondary | ICD-10-CM

## 2016-09-02 DIAGNOSIS — Z122 Encounter for screening for malignant neoplasm of respiratory organs: Secondary | ICD-10-CM

## 2016-09-02 DIAGNOSIS — I4891 Unspecified atrial fibrillation: Secondary | ICD-10-CM

## 2016-09-02 DIAGNOSIS — F32A Depression, unspecified: Secondary | ICD-10-CM

## 2016-09-02 DIAGNOSIS — Z1239 Encounter for other screening for malignant neoplasm of breast: Secondary | ICD-10-CM

## 2016-09-02 DIAGNOSIS — F419 Anxiety disorder, unspecified: Principal | ICD-10-CM

## 2016-09-02 DIAGNOSIS — G8929 Other chronic pain: Secondary | ICD-10-CM

## 2016-09-02 DIAGNOSIS — F329 Major depressive disorder, single episode, unspecified: Secondary | ICD-10-CM

## 2016-09-02 DIAGNOSIS — M25562 Pain in left knee: Secondary | ICD-10-CM

## 2016-09-02 DIAGNOSIS — Z720 Tobacco use: Secondary | ICD-10-CM

## 2016-09-02 DIAGNOSIS — Z1231 Encounter for screening mammogram for malignant neoplasm of breast: Secondary | ICD-10-CM

## 2016-09-02 MED ORDER — TETANUS-DIPHTHERIA TOXOIDS TD 5-2 LFU IM INJ: 0.5000 mL | INJECTION | Freq: Once | INTRAMUSCULAR | 0 refills | Status: AC

## 2016-09-02 MED ORDER — ZOSTER VACCINE LIVE 19400 UNT/0.65ML ~~LOC~~ SUSR: 0.6500 mL | Freq: Once | SUBCUTANEOUS | 0 refills | Status: AC

## 2016-09-02 MED ORDER — METOPROLOL SUCCINATE ER 50 MG PO TB24: ORAL_TABLET | ORAL | 3 refills | Status: DC

## 2016-09-02 MED ORDER — SERTRALINE HCL 50 MG PO TABS: 50.0000 mg | ORAL_TABLET | Freq: Every day | ORAL | 3 refills | Status: DC

## 2016-09-02 NOTE — Assessment & Plan Note (Signed)
Completed MWV with health advisor last week. Immunizations UTD except for shingles and tetanus for which prescriptions were provided. Mammogram and bone density ordered and pending. Discussed the importance of a healthy diet and regular exercise in order for weight loss, and to reduce the risk of other medical diseases. Exam unremarkable. Labs stable. Follow up in 1 year.

## 2016-09-02 NOTE — Assessment & Plan Note (Signed)
Stable in the office today, continue Toprol XL.  

## 2016-09-02 NOTE — Assessment & Plan Note (Signed)
Doing well on Zoloft, refill provided. Denies SI/HI.

## 2016-09-02 NOTE — Patient Instructions (Signed)
You will be contacted regarding your referral for Lung Cancer Screening.  Please let us know if you have not heard back within one week.   Call to schedule your mammogram and bone density testing at the [facility] at [facility].  Start exercising. You should be getting 150 minutes of moderate intensity exercise weekly.  It's importance to improve your diet by reducing consumption of fast food, fried food, processed snack foods, sugary drinks. Increase consumption of fresh vegetables and fruits, whole grains, water.  Ensure you are drinking 64 ounces of water daily.  Call your insurance company in regards to coverage for the shingles and tetanus vaccination.  Follow up in 1 year for your annual exam or sooner if needed.  It was a pleasure to see you today!

## 2016-09-02 NOTE — Assessment & Plan Note (Signed)
Underwent surgery to left knee, feeling much improved.

## 2016-09-02 NOTE — Progress Notes (Signed)
   Subjective:    Patient ID: Stephanie [name], female    DOB: [birth year], 65 y.o.   MRN: [id number]  HPI  Stephanie [name] is a 65 year old female who presents today for [place] part 2 and follow up. She has no complaints today and is needing refills of her medications.  1) Anxiety and Depression: Currently managed on Zoloft. She stopped taking Wellbutrin in November as she ran out and never refilled it. She noticed improvement while on the medication, but it was too costly. She feels well managed on her current regimen, denies SI/HI.  2) Atrial Fibrillation: Currently managed on Toprol XL 50 mg. She denies chest pain, shortness of breath, weakness.  3) [place] Part 2: Saw hour health advisor on 08/27/16. She completed Prevnar 13, Influenza vaccination, Hepatitis C Screening. Her last mammogram was in 2015. Her last bone density was in 2015 years ago.    Review of Systems  Constitutional: Negative for unexpected weight change.  HENT: Negative for rhinorrhea.   Respiratory: Negative for cough and shortness of breath.   Cardiovascular: Negative for chest pain and palpitations.  Gastrointestinal: Negative for constipation and diarrhea.  Genitourinary: Negative for difficulty urinating and menstrual problem.  Musculoskeletal: Negative for arthralgias and myalgias.  Skin: Negative for rash.  Allergic/Immunologic: Negative for environmental allergies.  Neurological: Negative for dizziness, weakness, numbness and headaches.  Psychiatric/Behavioral: Negative for suicidal ideas. The patient is not nervous/anxious.        Objective:   Physical Exam  Constitutional: She is oriented to person, place, and time. She appears well-nourished.  HENT:  Right Ear: Tympanic membrane and ear canal normal.  Left Ear: Tympanic membrane and ear canal normal.  Nose: Nose normal.  Mouth/Throat: Oropharynx is clear and moist.  Eyes: Conjunctivae and EOM are normal. Pupils are equal, round, and reactive to light.  Neck:  Neck supple. No thyromegaly present.  Cardiovascular: Normal rate and regular rhythm.   No murmur heard. Pulmonary/Chest: Effort normal and breath sounds normal. She has no rales.  Abdominal: Soft. Bowel sounds are normal. There is no tenderness.  Musculoskeletal: Normal range of motion.  Lymphadenopathy:    She has no cervical adenopathy.  Neurological: She is alert and oriented to person, place, and time. She has normal reflexes. No cranial nerve deficit.  Skin: Skin is warm and dry. No rash noted.  Psychiatric: She has a normal mood and affect.          Assessment & Plan:

## 2016-09-02 NOTE — Progress Notes (Signed)
Pre visit review using our clinic review tool, if applicable. No additional management support is needed unless otherwise documented below in the visit note. 

## 2016-09-02 NOTE — Assessment & Plan Note (Signed)
Regular rate and rhythm. Continue Toprol.

## 2016-09-02 NOTE — Assessment & Plan Note (Signed)
No improvement with Wellbutrin, stopped taking. Not interested in quitting at this time. Referral placed for low dose CT scan for lung cancer screening.

## 2016-09-10 ENCOUNTER — Telehealth: Payer: Self-pay | Admitting: *Deleted

## 2016-09-10 ENCOUNTER — Other Ambulatory Visit: Payer: Self-pay | Admitting: *Deleted

## 2016-09-10 DIAGNOSIS — Z87891 Personal history of nicotine dependence: Secondary | ICD-10-CM

## 2016-09-10 NOTE — Telephone Encounter (Signed)
Received referral for initial lung cancer screening scan. Contacted patient and obtained smoking history,(current, 40 pack year) as well as answering questions related to screening process. Patient denies signs of lung cancer such as weight loss or hemoptysis. Patient denies comorbidity that would prevent curative treatment if lung cancer were found. Patient is tentatively scheduled for shared decision making visit and CT scan on 10/07/15 at 1:30pm, pending insurance approval from business office.

## 2016-09-29 NOTE — Progress Notes (Signed)
I reviewed health advisor's note, was available for consultation, and agree with documentation and plan.  Amy Bedsole, MD [facility] [place]  

## 2016-10-06 ENCOUNTER — Ambulatory Visit: Admission: RE | Admit: 2016-10-06 | Payer: Medicare Other | Source: Ambulatory Visit

## 2016-10-06 ENCOUNTER — Encounter: Payer: Self-pay | Admitting: Oncology

## 2016-10-06 ENCOUNTER — Inpatient Hospital Stay: Payer: Medicare Other | Admitting: Oncology

## 2016-10-13 ENCOUNTER — Inpatient Hospital Stay: Admission: RE | Admit: 2016-10-13 | Payer: Medicare Other | Source: Ambulatory Visit

## 2016-10-14 ENCOUNTER — Inpatient Hospital Stay: Payer: Medicare Other | Attending: Oncology | Admitting: Oncology

## 2016-10-14 ENCOUNTER — Ambulatory Visit
Admission: RE | Admit: 2016-10-14 | Discharge: 2016-10-14 | Disposition: A | Discharge status: Home / Self Care | Payer: Medicare Other | Source: Ambulatory Visit | Attending: Oncology | Admitting: Oncology

## 2016-10-14 DIAGNOSIS — Z87891 Personal history of nicotine dependence: Secondary | ICD-10-CM | POA: Insufficient documentation

## 2016-10-15 ENCOUNTER — Encounter: Payer: Self-pay | Admitting: *Deleted

## 2016-10-15 DIAGNOSIS — Z87891 Personal history of nicotine dependence: Secondary | ICD-10-CM | POA: Insufficient documentation

## 2016-10-15 NOTE — Progress Notes (Signed)
In accordance with CMS guidelines, patient has met eligibility criteria including age, absence of signs or symptoms of lung cancer.  Social History  Substance Use Topics  . Smoking status: Current Every Day Smoker    Packs/day: 1.00    Years: 40.00    Types: Cigarettes  . Smokeless tobacco: Never Used  . Alcohol use 0.0 oz/week     Comment: social     A shared decision-making session was conducted prior to the performance of CT scan. This includes one or more decision aids, includes benefits and harms of screening, follow-up diagnostic testing, over-diagnosis, false positive rate, and total radiation exposure.  Counseling on the importance of adherence to annual lung cancer LDCT screening, impact of co-morbidities, and ability or willingness to undergo diagnosis and treatment is imperative for compliance of the program.  Counseling on the importance of continued smoking cessation for former smokers; the importance of smoking cessation for current smokers, and information about tobacco cessation interventions have been given to patient including [place] and [facility] quit [place] programs.  Written order for lung cancer screening with LDCT has been given to the patient and any and all questions have been answered to the best of my abilities.   Yearly follow up will be coordinated by Burgess Estelle, Thoracic Navigator.  Faythe Casa, NP

## 2016-11-03 ENCOUNTER — Ambulatory Visit
Admission: RE | Admit: 2016-11-03 | Discharge: 2016-11-03 | Disposition: A | Discharge status: Home / Self Care | Payer: Medicare Other | Source: Ambulatory Visit | Attending: Primary Care | Admitting: Primary Care

## 2016-11-03 DIAGNOSIS — Z780 Asymptomatic menopausal state: Secondary | ICD-10-CM | POA: Diagnosis not present

## 2016-11-03 DIAGNOSIS — Z1239 Encounter for other screening for malignant neoplasm of breast: Secondary | ICD-10-CM

## 2016-11-03 DIAGNOSIS — Z87891 Personal history of nicotine dependence: Secondary | ICD-10-CM | POA: Insufficient documentation

## 2016-11-03 DIAGNOSIS — Z1231 Encounter for screening mammogram for malignant neoplasm of breast: Secondary | ICD-10-CM | POA: Insufficient documentation

## 2016-11-03 DIAGNOSIS — E2839 Other primary ovarian failure: Secondary | ICD-10-CM

## 2017-01-22 ENCOUNTER — Ambulatory Visit (INDEPENDENT_AMBULATORY_CARE_PROVIDER_SITE_OTHER): Payer: Medicare Other

## 2017-01-22 ENCOUNTER — Ambulatory Visit (INDEPENDENT_AMBULATORY_CARE_PROVIDER_SITE_OTHER)
Admission: RE | Admit: 2017-01-22 | Discharge: 2017-01-22 | Disposition: A | Discharge status: Home / Self Care | Payer: Medicare Other | Source: Ambulatory Visit | Attending: Primary Care | Admitting: Primary Care

## 2017-01-22 ENCOUNTER — Encounter: Payer: Self-pay | Admitting: Primary Care

## 2017-01-22 ENCOUNTER — Other Ambulatory Visit: Payer: Self-pay

## 2017-01-22 ENCOUNTER — Ambulatory Visit (INDEPENDENT_AMBULATORY_CARE_PROVIDER_SITE_OTHER): Payer: Medicare Other | Admitting: Primary Care

## 2017-01-22 VITALS — BP 118/70 | HR 88 | Temp 97.7°F | Ht 66.0 in | Wt 198.4 lb

## 2017-01-22 DIAGNOSIS — R0602 Shortness of breath: Secondary | ICD-10-CM

## 2017-01-22 DIAGNOSIS — R5383 Other fatigue: Secondary | ICD-10-CM

## 2017-01-22 LAB — TSH: TSH: 0.72 u[IU]/mL (ref 0.35–4.50)

## 2017-01-22 LAB — CBC
HEMATOCRIT: 42.5 % (ref 36.0–46.0)
Hemoglobin: 14.6 g/dL (ref 12.0–15.0)
MCHC: 34.2 g/dL (ref 30.0–36.0)
MCV: 92.7 fl (ref 78.0–100.0)
Platelets: 237 10*3/uL (ref 150.0–400.0)
RBC: 4.59 Mil/uL (ref 3.87–5.11)
RDW: 13.3 % (ref 11.5–15.5)
WBC: 6.6 10*3/uL (ref 4.0–10.5)

## 2017-01-22 LAB — COMPREHENSIVE METABOLIC PANEL WITH GFR
ALT: 12 U/L (ref 0–35)
AST: 17 U/L (ref 0–37)
Albumin: 4.1 g/dL (ref 3.5–5.2)
Alkaline Phosphatase: 73 U/L (ref 39–117)
BUN: 21 mg/dL (ref 6–23)
CO2: 29 meq/L (ref 19–32)
Calcium: 9.5 mg/dL (ref 8.4–10.5)
Chloride: 105 meq/L (ref 96–112)
Creatinine, Ser: 1.08 mg/dL (ref 0.40–1.20)
GFR: 53.65 mL/min — ABNORMAL LOW
Glucose, Bld: 104 mg/dL — ABNORMAL HIGH (ref 70–99)
Potassium: 3.9 meq/L (ref 3.5–5.1)
Sodium: 140 meq/L (ref 135–145)
Total Bilirubin: 0.6 mg/dL (ref 0.2–1.2)
Total Protein: 7.2 g/dL (ref 6.0–8.3)

## 2017-01-22 LAB — ECHOCARDIOGRAM COMPLETE
Height: 66 in
Weight: 3174.4 [oz_av]

## 2017-01-22 MED ORDER — ALBUTEROL SULFATE HFA 108 (90 BASE) MCG/ACT IN AERS: 2.0000 | INHALATION_SPRAY | RESPIRATORY_TRACT | 0 refills | Status: DC | PRN

## 2017-01-22 NOTE — Progress Notes (Signed)
Subjective:    Patient ID: Stephanie [name], female    DOB: [birth year], 66 y.o.   MRN: [id number]  HPI  Stephanie [name] is a 66 year old female with a history of anxiety and depression, tobacco abuse, atrial fibrillation, obesity who presents today with a chief complaint of fatigue.   She experiences symptoms of fatigue with little to no energy, feeling lightheaded, flushing feeling, chest pressure, shortness of breath. Her symptoms began two months ago with progression of symptoms two weeks ago. She gets short of breath with grocery shopping, cleaning her house, walking from the front of our office to the exam room. She will sit and rest with some improvement. She's also experienced several episodes of nausea with vomiting intermittently with in the last several months. She had a cough with smoking, but has noticed an increase in cough over the last several months.  She did quit smoking in January 2018 and restarted several weeks ago. Her last echocardiogram was in 2015. She denies lower extremity edema, fevers, palpitations, esophageal reflux.  Review of Systems  Constitutional: Positive for fatigue.  Respiratory: Positive for shortness of breath. Negative for cough.   Cardiovascular: Negative for chest pain.       Chest pressure  Gastrointestinal: Positive for nausea.  Genitourinary:       Hot flashes  Neurological: Positive for light-headedness.       Past Medical History:  Diagnosis Date  . Restless leg      Social History   Social History  . Marital status: Single    Spouse name: N/A  . Number of children: N/A  . Years of education: N/A   Occupational History  . Not on file.   Social History Main Topics  . Smoking status: Current Every Day Smoker    Packs/day: 1.00    Years: 40.00    Types: Cigarettes  . Smokeless tobacco: Never Used  . Alcohol use 0.0 oz/week     Comment: social  . Drug use: No  . Sexual activity: No   Other Topics Concern  . Not on file    Social History Narrative   Divorced   Just moved from [place].   Originally from [place].   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    Past Surgical History:  Procedure Laterality Date  . TOTAL KNEE ARTHROPLASTY Left 07/16/2016    No family history on file.  Allergies  Allergen Reactions  . Codeine   . Pyridium  [Phenazopyridine Hcl]     Current Outpatient Prescriptions on File Prior to Visit  Medication Sig Dispense Refill  . calcium citrate-vitamin D (CITRACAL+D) 315-200 MG-UNIT per tablet Take by mouth.    . metoprolol succinate (TOPROL-XL) 50 MG 24 hr tablet TAKE 1 TABLET (50 MG TOTAL) BY MOUTH DAILY. TAKE WITH OR IMMEDIATELY FOLLOWING A MEAL. 90 tablet 3  . sertraline (ZOLOFT) 50 MG tablet Take 1 tablet (50 mg total) by mouth daily. 90 tablet 3   No current facility-administered medications on file prior to visit.     BP 118/70   Pulse 88   Temp 97.7 F (36.5 C) (Oral)   Ht 5\' 6"  (1.676 m)   Wt 198 lb 6.4 oz (90 kg)   SpO2 97%   BMI 32.02 kg/m    Objective:   Physical Exam  Constitutional: She appears well-nourished.  Neck: Neck supple.  Cardiovascular: Normal rate and regular rhythm.   Pulmonary/Chest: Effort normal and breath  sounds normal. She has no wheezes. She has no rales.  Noticed her SOB when moving from the chair to the exam table in the office today.  Skin: Skin is warm and dry.  Psychiatric: She has a normal mood and affect.          Assessment & Plan:  Fatigue:  Also with exertional SOB, cough x 2-3 months. Exam today with NSR, no acute distress, did notice her shortness of breath after moving from the chair to the exam table. Lungs clear. ECG today: Sinus arrhythmia, no ST changes, right axis. Symptoms are concerning for CHF, pulmonary involvement Chest xray pending. Check CBC, TSH, CMP.  Will repeat echocardiogram as last study was in 2015. Rx for albuterol sent to pharmacy.  Consider ICS/LABA if needed.  Sheral Flow, NP

## 2017-01-22 NOTE — Progress Notes (Signed)
Pre visit review using our clinic review tool, if applicable. No additional management support is needed unless otherwise documented below in the visit note. 

## 2017-01-22 NOTE — Patient Instructions (Addendum)
Complete lab work and chest xray prior to leaving today.   I'll be in touch with you as soon as I receive your results.  You will be contacted regarding your echocardiogram.  Please let us know if you have not heard back within one week.   It was a pleasure to see you today!

## 2017-01-25 ENCOUNTER — Encounter: Payer: Self-pay | Admitting: Primary Care

## 2017-03-29 ENCOUNTER — Encounter: Payer: Self-pay | Admitting: Internal Medicine

## 2017-03-29 ENCOUNTER — Ambulatory Visit (INDEPENDENT_AMBULATORY_CARE_PROVIDER_SITE_OTHER): Payer: Medicare Other | Admitting: Internal Medicine

## 2017-03-29 VITALS — BP 122/84 | HR 65 | Temp 98.0°F | Wt 205.8 lb

## 2017-03-29 DIAGNOSIS — L708 Other acne: Secondary | ICD-10-CM

## 2017-03-29 DIAGNOSIS — D235 Other benign neoplasm of skin of trunk: Secondary | ICD-10-CM

## 2017-03-29 NOTE — Progress Notes (Signed)
Subjective:    Patient ID: Stephanie [name], female    DOB: 09-05-1951, 66 y.o.   MRN: [id number]  HPI  Pt presents to the clinic today with c/o an abnormal mole on her back. She first noticed this over the weekend. She reports he daughters saw it while they were swimming at the pool. She is not sure how long it has been there. It does not itch or hurt. It has not drained.  Review of Systems      Past Medical History:  Diagnosis Date  . Restless leg     Current Outpatient Prescriptions  Medication Sig Dispense Refill  . albuterol (PROVENTIL HFA;VENTOLIN HFA) 108 (90 Base) MCG/ACT inhaler Inhale 2 puffs into the lungs every 4 (four) hours as needed for shortness of breath. 1 Inhaler 0  . calcium citrate-vitamin D (CITRACAL+D) 315-200 MG-UNIT per tablet Take by mouth.    . metoprolol succinate (TOPROL-XL) 50 MG 24 hr tablet TAKE 1 TABLET (50 MG TOTAL) BY MOUTH DAILY. TAKE WITH OR IMMEDIATELY FOLLOWING A MEAL. 90 tablet 3  . sertraline (ZOLOFT) 50 MG tablet Take 1 tablet (50 mg total) by mouth daily. 90 tablet 3   No current facility-administered medications for this visit.     Allergies  Allergen Reactions  . Codeine   . Pyridium  [Phenazopyridine Hcl]     No family history on file.  Social History   Social History  . Marital status: Single    Spouse name: N/A  . Number of children: N/A  . Years of education: N/A   Occupational History  . Not on file.   Social History Main Topics  . Smoking status: Current Every Day Smoker    Packs/day: 1.00    Years: 40.00    Types: Cigarettes  . Smokeless tobacco: Never Used  . Alcohol use 0.0 oz/week     Comment: social  . Drug use: No  . Sexual activity: No   Other Topics Concern  . Not on file   Social History Narrative   Divorced   Just moved from [place].   Originally from [place].   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     Constitutional: Denies fever,  malaise, fatigue, headache or abrupt weight changes.  Skin: Pt reports abnormal mole on back. Denies redness, rashes, or ulcercations.    No other specific complaints in a complete review of systems (except as listed in HPI above).  Objective:   Physical Exam  BP 122/84   Pulse 65   Temp 98 F (36.7 C) (Oral)   Wt 205 lb 12 oz (93.3 kg)   SpO2 99%   BMI 33.21 kg/m  Wt Readings from Last 3 Encounters:  03/29/17 205 lb 12 oz (93.3 kg)  01/22/17 198 lb 6.4 oz (90 kg)  10/14/16 200 lb (90.7 kg)    General: Appears her stated age, well developed, well nourished in NAD. Skin: 0.25 cm dilated port of winer noted on midline back just above bra line.  BMET    Component Value Date/Time   NA 140 01/22/2017 0806   K 3.9 01/22/2017 0806   CL 105 01/22/2017 0806   CO2 29 01/22/2017 0806   GLUCOSE 104 (H) 01/22/2017 0806   BUN 21 01/22/2017 0806   CREATININE 1.08 01/22/2017 0806   CALCIUM 9.5 01/22/2017 0806    Lipid Panel     Component Value Date/Time   CHOL 178 08/27/2016 1510  TRIG 116.0 08/27/2016 1510   HDL 54.00 08/27/2016 1510   CHOLHDL 3 08/27/2016 1510   VLDL 23.2 08/27/2016 1510   LDLCALC 101 (H) 08/27/2016 1510    CBC    Component Value Date/Time   WBC 6.6 01/22/2017 0806   RBC 4.59 01/22/2017 0806   HGB 14.6 01/22/2017 0806   HCT 42.5 01/22/2017 0806   PLT 237.0 01/22/2017 0806   MCV 92.7 01/22/2017 0806   MCHC 34.2 01/22/2017 0806   RDW 13.3 01/22/2017 0806    Hgb A1C Lab Results  Component Value Date   HGBA1C 5.4 08/27/2016            Assessment & Plan:   Dilated Port of Winer:  Lesion drained Discussed benign nature  Return precautions discussed Webb Silversmith, NP

## 2017-06-29 ENCOUNTER — Encounter: Payer: Self-pay | Admitting: Primary Care

## 2017-06-29 DIAGNOSIS — Z720 Tobacco use: Secondary | ICD-10-CM

## 2017-06-29 MED ORDER — VARENICLINE TARTRATE 0.5 MG X 11 & 1 MG X 42 PO MISC: ORAL | 0 refills | Status: DC

## 2017-08-30 ENCOUNTER — Ambulatory Visit: Payer: Medicare Other

## 2017-09-03 ENCOUNTER — Other Ambulatory Visit: Payer: Self-pay | Admitting: Internal Medicine

## 2017-09-03 DIAGNOSIS — E559 Vitamin D deficiency, unspecified: Secondary | ICD-10-CM

## 2017-09-03 DIAGNOSIS — Z13220 Encounter for screening for lipoid disorders: Secondary | ICD-10-CM

## 2017-09-03 DIAGNOSIS — I10 Essential (primary) hypertension: Secondary | ICD-10-CM

## 2017-09-07 ENCOUNTER — Ambulatory Visit (INDEPENDENT_AMBULATORY_CARE_PROVIDER_SITE_OTHER): Payer: Medicare Other

## 2017-09-07 ENCOUNTER — Ambulatory Visit: Payer: Medicare Other | Admitting: Family Medicine

## 2017-09-07 ENCOUNTER — Encounter: Payer: Medicare Other | Admitting: Primary Care

## 2017-09-07 ENCOUNTER — Other Ambulatory Visit: Payer: Medicare Other

## 2017-09-07 VITALS — BP 112/86 | HR 73 | Temp 98.1°F | Ht 65.5 in | Wt 195.0 lb

## 2017-09-07 DIAGNOSIS — E559 Vitamin D deficiency, unspecified: Secondary | ICD-10-CM | POA: Insufficient documentation

## 2017-09-07 DIAGNOSIS — Z0000 Encounter for general adult medical examination without abnormal findings: Secondary | ICD-10-CM | POA: Diagnosis not present

## 2017-09-07 DIAGNOSIS — I10 Essential (primary) hypertension: Secondary | ICD-10-CM

## 2017-09-07 DIAGNOSIS — Z13220 Encounter for screening for lipoid disorders: Secondary | ICD-10-CM | POA: Insufficient documentation

## 2017-09-07 LAB — CBC
HEMATOCRIT: 40.8 % (ref 36.0–46.0)
Hemoglobin: 13.7 g/dL (ref 12.0–15.0)
MCHC: 33.6 g/dL (ref 30.0–36.0)
MCV: 94.4 fl (ref 78.0–100.0)
Platelets: 258 10*3/uL (ref 150.0–400.0)
RBC: 4.32 Mil/uL (ref 3.87–5.11)
RDW: 13.2 % (ref 11.5–15.5)
WBC: 6.8 10*3/uL (ref 4.0–10.5)

## 2017-09-07 LAB — LIPID PANEL
CHOLESTEROL: 142 mg/dL (ref 0–200)
HDL: 51.1 mg/dL (ref >39.00)
LDL CALC: 74 mg/dL (ref 0–99)
NonHDL: 90.87
Total CHOL/HDL Ratio: 3
Triglycerides: 85 mg/dL (ref 0.0–149.0)
VLDL: 17 mg/dL (ref 0.0–40.0)

## 2017-09-07 LAB — COMPREHENSIVE METABOLIC PANEL
ALBUMIN: 3.7 g/dL (ref 3.5–5.2)
ALT: 9 U/L (ref 0–35)
AST: 15 U/L (ref 0–37)
Alkaline Phosphatase: 66 U/L (ref 39–117)
BUN: 13 mg/dL (ref 6–23)
CALCIUM: 9 mg/dL (ref 8.4–10.5)
CHLORIDE: 109 meq/L (ref 96–112)
CO2: 32 mEq/L (ref 19–32)
CREATININE: 0.92 mg/dL (ref 0.40–1.20)
GFR: 64.43 mL/min (ref >60.00)
Glucose, Bld: 98 mg/dL (ref 70–99)
Potassium: 5.5 mEq/L — ABNORMAL HIGH (ref 3.5–5.1)
Sodium: 144 mEq/L (ref 135–145)
Total Bilirubin: 0.5 mg/dL (ref 0.2–1.2)
Total Protein: 6.7 g/dL (ref 6.0–8.3)

## 2017-09-07 LAB — VITAMIN D 25 HYDROXY (VIT D DEFICIENCY, FRACTURES): VITD: 33.18 ng/mL (ref 30.00–100.00)

## 2017-09-07 NOTE — Progress Notes (Signed)
PCP notes:   Health maintenance:  Flu vaccine - postponed/pt is ill today  Abnormal screenings:   Depression score: 7  Patient concerns:   Pt reports cough and sinus drainage that has persisted for past 3 wks. Acute visit scheduled with PCP.  Nurse concerns:  None  Next PCP appt:   09/08/17 @ 1230  I reviewed health advisor's note, was available for consultation, and agree with documentation and plan. Loura Pardon MD

## 2017-09-07 NOTE — Progress Notes (Signed)
Pre visit review using our clinic review tool, if applicable. No additional management support is needed unless otherwise documented below in the visit note. 

## 2017-09-07 NOTE — Patient Instructions (Addendum)
**Please remember to contact new insurance plan to determine coverage for Tetanus and Shingrix vaccines.**   Stephanie [name] , Thank you for taking time to come for your Medicare Wellness Visit. I appreciate your ongoing commitment to your health goals. Please review the following plan we discussed and let me know if I can assist you in the future.   These are the goals we discussed: Goals    . Quit smoking / using tobacco       This is a list of the screening recommended for you and due dates:  Health Maintenance  Topic Date Due  . Flu Shot  12/19/2017*  . Tetanus Vaccine  08/27/2026*  . Mammogram  11/03/2018  . Colon Cancer Screening  09/22/2023  . DEXA scan (bone density measurement)  Completed  .  Hepatitis C: One time screening is recommended by Center for Disease Control  (CDC) for  adults born from [birth year] through 1967.   Completed  . Pneumonia vaccines  Completed  *Topic was postponed. The date shown is not the original due date.   Preventive Care for Adults  A healthy lifestyle and preventive care can promote health and wellness. Preventive health guidelines for adults include the following key practices.  . A routine yearly physical is a good way to check with your health care provider about your health and preventive screening. It is a chance to share any concerns and updates on your health and to receive a thorough exam.  . Visit your dentist for a routine exam and preventive care every 6 months. Brush your teeth twice a day and floss once a day. Good oral hygiene prevents tooth decay and gum disease.  . The frequency of eye exams is based on your age, health, family medical history, use  of contact lenses, and other factors. Follow your health care provider's recommendations for frequency of eye exams.  . Eat a healthy diet. Foods like vegetables, fruits, whole grains, low-fat dairy products, and lean protein foods contain the nutrients you need without too many calories.  Decrease your intake of foods high in solid fats, added sugars, and salt. Eat the right amount of calories for you. Get information about a proper diet from your health care provider, if necessary.  . Regular physical exercise is one of the most important things you can do for your health. Most adults should get at least 150 minutes of moderate-intensity exercise (any activity that increases your heart rate and causes you to sweat) each week. In addition, most adults need muscle-strengthening exercises on 2 or more days a week.  Silver Sneakers may be a benefit available to you. To determine eligibility, you may visit the website: www.silversneakers.com or contact program at [phone] Mon-Fri between 8AM-8PM.   . Maintain a healthy weight. The body mass index (BMI) is a screening tool to identify possible weight problems. It provides an estimate of body fat based on height and weight. Your health care provider can find your BMI and can help you achieve or maintain a healthy weight.   For adults 20 years and older: ? A BMI below 18.5 is considered underweight. ? A BMI of 18.5 to 24.9 is normal. ? A BMI of 25 to 29.9 is considered overweight. ? A BMI of 30 and above is considered obese.   . Maintain normal blood lipids and cholesterol levels by exercising and minimizing your intake of saturated fat. Eat a balanced diet with plenty of fruit and vegetables. Blood tests for lipids  and cholesterol should begin at age 59 and be repeated every 5 years. If your lipid or cholesterol levels are high, you are over 50, or you are at high risk for heart disease, you may need your cholesterol levels checked more frequently. Ongoing high lipid and cholesterol levels should be treated with medicines if diet and exercise are not working.  . If you smoke, find out from your health care provider how to quit. If you do not use tobacco, please do not start.  . If you choose to drink alcohol, please do not consume  more than 2 drinks per day. One drink is considered to be 12 ounces (355 mL) of beer, 5 ounces (148 mL) of wine, or 1.5 ounces (44 mL) of liquor.  . If you are 35-31 years old, ask your health care provider if you should take aspirin to prevent strokes.  . Use sunscreen. Apply sunscreen liberally and repeatedly throughout the day. You should seek shade when your shadow is shorter than you. Protect yourself by wearing long sleeves, pants, a wide-brimmed hat, and sunglasses year round, whenever you are outdoors.  . Once a month, do a whole body skin exam, using a mirror to look at the skin on your back. Tell your health care provider of new moles, moles that have irregular borders, moles that are larger than a pencil eraser, or moles that have changed in shape or color.

## 2017-09-07 NOTE — Progress Notes (Signed)
Subjective:   Stephanie [name] is a 66 y.o. female who presents for Medicare Annual (Subsequent) preventive examination.  Review of Systems:  N/A Cardiac Risk Factors include: advanced age (>42men, >45 women);smoking/ tobacco exposure;hypertension;obesity (BMI >30kg/m2)     Objective:     Vitals: BP 112/86 (BP Location: Right Arm, Patient Position: Sitting, Cuff Size: Normal)   Pulse 73   Temp 98.1 F (36.7 C) (Oral)   Ht 5' 5.5" (1.664 m) Comment: no shoes  Wt 195 lb (88.5 kg)   SpO2 96%   BMI 31.96 kg/m   Body mass index is 31.96 kg/m.  Advanced Directives 09/07/2017 08/27/2016  Does Patient Have a Medical Advance Directive? No No  Would patient like information on creating a medical advance directive? Yes (MAU/Ambulatory/Procedural Areas - Information given) -    Tobacco Social History   Tobacco Use  Smoking Status Current Every Day Smoker  . Packs/day: 1.00  . Years: 40.00  . Pack years: 40.00  . Types: Cigarettes  Smokeless Tobacco Never Used     Ready to quit: No Counseling given: No   Clinical Intake:  Pre-visit preparation completed: Yes  Pain : No/denies pain Pain Score: 0-No pain     Nutritional Status: BMI > 30  Obese Nutritional Risks: None Diabetes: No  How often do you need to have someone help you when you read instructions, pamphlets, or other written materials from your doctor or pharmacy?: 1 - Never What is the last grade level you completed in school?: GED  Interpreter Needed?: No  Comments: pt lives alone Information entered by :: LPinson,LPN  Past Medical History:  Diagnosis Date  . Restless leg    Past Surgical History:  Procedure Laterality Date  . TOTAL KNEE ARTHROPLASTY Left 07/16/2016   History reviewed. No pertinent family history. Social History   Socioeconomic History  . Marital status: Single    Spouse name: None  . Number of children: None  . Years of education: None  . Highest education level: None    Social Needs  . Financial resource strain: None  . Food insecurity - worry: None  . Food insecurity - inability: None  . Transportation needs - medical: None  . Transportation needs - non-medical: None  Occupational History  . None  Tobacco Use  . Smoking status: Current Every Day Smoker    Packs/day: 1.00    Years: 40.00    Pack years: 40.00    Types: Cigarettes  . Smokeless tobacco: Never Used  Substance and Sexual Activity  . Alcohol use: Yes    Alcohol/week: 0.0 oz    Comment: social  . Drug use: No  . Sexual activity: No  Other Topics Concern  . None  Social History Narrative   Divorced   Just moved from [place].   Originally from [place].   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    Outpatient Encounter Medications as of 09/07/2017  Medication Sig  . albuterol (PROVENTIL HFA;VENTOLIN HFA) 108 (90 Base) MCG/ACT inhaler Inhale 2 puffs into the lungs every 4 (four) hours as needed for shortness of breath.  . calcium citrate-vitamin D (CITRACAL+D) 315-200 MG-UNIT per tablet Take by mouth.  . Cholecalciferol (VITAMIN D3 PO) Take 1 tablet by mouth daily.  . Cyanocobalamin (VITAMIN B-12 PO) Take 1 tablet by mouth daily.  . metoprolol succinate (TOPROL-XL) 50 MG 24 hr tablet TAKE 1 TABLET (50 MG TOTAL) BY MOUTH DAILY. TAKE WITH OR IMMEDIATELY  FOLLOWING A MEAL.  Marland Kitchen sertraline (ZOLOFT) 50 MG tablet Take 1 tablet (50 mg total) by mouth daily.  . varenicline (CHANTIX STARTING MONTH PAK) 0.5 MG X 11 & 1 MG X 42 tablet Take 0.5 mg by mouth once daily for 3 days, then increase to 0.5 mg twice daily for 4 days, then increase to 1 mg twice daily. (Patient not taking: Reported on 09/07/2017)   No facility-administered encounter medications on file as of 09/07/2017.     Activities of Daily Living In your present state of health, do you have any difficulty performing the following activities: 09/07/2017  Hearing? N  Vision? Y   Difficulty concentrating or making decisions? N  Walking or climbing stairs? N  Dressing or bathing? N  Doing errands, shopping? N  Preparing Food and eating ? N  Using the Toilet? N  In the past six months, have you accidently leaked urine? Y  Comment when coughing   Do you have problems with loss of bowel control? N  Managing your Medications? N  Managing your Finances? N  Housekeeping or managing your Housekeeping? N  Some recent data might be hidden    Patient Care Team: Pleas Koch, NP as PCP - General (Nurse Practitioner)    Assessment:   This is a routine wellness examination for Stephanie [name].   Hearing Screening   125Hz  250Hz  500Hz  1000Hz  2000Hz  3000Hz  4000Hz  6000Hz  8000Hz   Right ear:   40 40 40  40    Left ear:   40 40 40  40      Visual Acuity Screening   Right eye Left eye Both eyes  Without correction:     With correction: 20/40 20/50 20/40      Exercise Activities and Dietary recommendations Current Exercise Habits: The patient does not participate in regular exercise at present, Exercise limited by: None identified  Goals    . Quit smoking / using tobacco       Fall Risk Fall Risk  09/07/2017 08/27/2016 08/23/2015  Falls in the past year? No Yes No  Comment - pt lost balance creating tear in left knee -  Number falls in past yr: - 1 -  Injury with Fall? - Yes -  Follow up - Falls evaluation completed -   Depression Screen PHQ 2/9 Scores 09/07/2017 08/27/2016 08/23/2015  PHQ - 2 Score 2 0 4  PHQ- 9 Score 7 - 12     Cognitive Function MMSE - Mini Mental State Exam 09/07/2017 08/27/2016  Orientation to time 5 5  Orientation to Place 5 5  Registration 3 3  Attention/ Calculation 0 0  Recall 3 3  Language- name 2 objects 0 0  Language- repeat 1 1  Language- follow 3 step command 3 3  Language- read & follow direction 0 0  Write a sentence 0 0  Copy design 0 0  Total score 20 20     PLEASE NOTE: A Mini-Cog screen was completed. Maximum score  is 20. A value of 0 denotes this part of Folstein MMSE was not completed or the patient failed this part of the Mini-Cog screening.   Mini-Cog Screening Orientation to Time - Max 5 pts Orientation to Place - Max 5 pts Registration - Max 3 pts Recall - Max 3 pts Language Repeat - Max 1 pts Language Follow 3 Step Command - Max 3 pts     Immunization History  Administered Date(s) Administered  . Influenza,inj,Quad PF,6+ Mos 08/23/2015, 08/27/2016  . Pneumococcal  Conjugate-13 08/27/2016  . Pneumococcal Polysaccharide-23 08/23/2015    Screening Tests Health Maintenance  Topic Date Due  . INFLUENZA VACCINE  12/19/2017 (Originally 04/21/2017)  . TETANUS/TDAP  08/27/2026 (Originally 03/20/1970)  . MAMMOGRAM  11/03/2018  . COLONOSCOPY  09/22/2023  . DEXA SCAN  Completed  . Hepatitis C Screening  Completed  . PNA vac Low Risk Adult  Completed       Plan:     I have personally reviewed, addressed, and noted the following in the patient's chart:  A. Medical and social history B. Use of alcohol, tobacco or illicit drugs  C. Current medications and supplements D. Functional ability and status E.  Nutritional status F.  Physical activity G. Advance directives H. List of other physicians I.  Hospitalizations, surgeries, and ER visits in previous 12 months J.  [place] to include hearing, vision, cognitive, depression L. Referrals and appointments - none  In addition, I have reviewed and discussed with patient certain preventive protocols, quality metrics, and best practice recommendations. A written personalized care plan for preventive services as well as general preventive health recommendations were provided to patient.  See attached scanned questionnaire for additional information.   Signed,   Lindell Noe, MHA, BS, LPN Health Coach

## 2017-09-08 ENCOUNTER — Ambulatory Visit (INDEPENDENT_AMBULATORY_CARE_PROVIDER_SITE_OTHER): Payer: Medicare Other | Admitting: Primary Care

## 2017-09-08 ENCOUNTER — Encounter: Payer: Self-pay | Admitting: Primary Care

## 2017-09-08 VITALS — BP 140/86 | HR 73 | Temp 98.2°F | Ht 65.5 in | Wt 194.4 lb

## 2017-09-08 DIAGNOSIS — J209 Acute bronchitis, unspecified: Secondary | ICD-10-CM | POA: Diagnosis not present

## 2017-09-08 MED ORDER — AZITHROMYCIN 250 MG PO TABS: ORAL_TABLET | ORAL | 0 refills | Status: DC

## 2017-09-08 MED ORDER — BENZONATATE 200 MG PO CAPS: 200.0000 mg | ORAL_CAPSULE | Freq: Three times a day (TID) | ORAL | 0 refills | Status: DC | PRN

## 2017-09-08 NOTE — Progress Notes (Signed)
Subjective:    Patient ID: Stephanie [name], female    DOB: 28-Feb-1951, 66 y.o.   MRN: [id number]  HPI  Stephanie [name] is a 66 year old female who presents today with a chief complaint of cough. She also reports shortness of breath, wheezing, fevers. Most of her other symptoms have dissipated with the exception of her cough. Her symptoms began four weeks ago. She's been around several family members that have had the same symptoms.   She's taken Delsym, Dayquil, Mucinex, tylenol, and hot tea with temporary improvement. Overall she's feeling about the same.  Review of Systems  Constitutional: Positive for chills, fatigue and fever.  HENT: Positive for congestion and sinus pressure. Negative for sore throat.   Respiratory: Positive for cough, shortness of breath and wheezing.        Past Medical History:  Diagnosis Date  . Restless leg      Social History   Socioeconomic History  . Marital status: Single    Spouse name: Not on file  . Number of children: Not on file  . Years of education: Not on file  . Highest education level: Not on file  Social Needs  . Financial resource strain: Not on file  . Food insecurity - worry: Not on file  . Food insecurity - inability: Not on file  . Transportation needs - medical: Not on file  . Transportation needs - non-medical: Not on file  Occupational History  . Not on file  Tobacco Use  . Smoking status: Current Every Day Smoker    Packs/day: 1.00    Years: 40.00    Pack years: 40.00    Types: Cigarettes  . Smokeless tobacco: Never Used  Substance and Sexual Activity  . Alcohol use: Yes    Alcohol/week: 0.0 oz    Comment: social  . Drug use: No  . Sexual activity: No  Other Topics Concern  . Not on file  Social History Narrative   Divorced   Just moved from [place].   Originally from [place].   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    Past Surgical History:  Procedure  Laterality Date  . TOTAL KNEE ARTHROPLASTY Left 07/16/2016    No family history on file.  Allergies  Allergen Reactions  . Codeine   . Pyridium  [Phenazopyridine Hcl]     Current Outpatient Medications on File Prior to Visit  Medication Sig Dispense Refill  . albuterol (PROVENTIL HFA;VENTOLIN HFA) 108 (90 Base) MCG/ACT inhaler Inhale 2 puffs into the lungs every 4 (four) hours as needed for shortness of breath. 1 Inhaler 0  . calcium citrate-vitamin D (CITRACAL+D) 315-200 MG-UNIT per tablet Take by mouth.    . Cholecalciferol (VITAMIN D3 PO) Take 1 tablet by mouth daily.    . Cyanocobalamin (VITAMIN B-12 PO) Take 1 tablet by mouth daily.    . metoprolol succinate (TOPROL-XL) 50 MG 24 hr tablet TAKE 1 TABLET (50 MG TOTAL) BY MOUTH DAILY. TAKE WITH OR IMMEDIATELY FOLLOWING A MEAL. 90 tablet 3  . sertraline (ZOLOFT) 50 MG tablet Take 1 tablet (50 mg total) by mouth daily. 90 tablet 3   No current facility-administered medications on file prior to visit.     BP 140/86   Pulse 73   Temp 98.2 F (36.8 C) (Oral)   Ht 5' 5.5" (1.664 m)   Wt 194 lb 6.4 oz (88.2 kg)   SpO2 98%   BMI 31.86 kg/m  Objective:   Physical Exam  Constitutional: She appears well-nourished. She appears ill.  HENT:  Right Ear: Tympanic membrane and ear canal normal.  Left Ear: Tympanic membrane and ear canal normal.  Nose: Right sinus exhibits no maxillary sinus tenderness and no frontal sinus tenderness. Left sinus exhibits no maxillary sinus tenderness and no frontal sinus tenderness.  Mouth/Throat: Oropharynx is clear and moist.  Eyes: Conjunctivae are normal.  Neck: Neck supple.  Cardiovascular: Normal rate and regular rhythm.  Pulmonary/Chest: Effort normal. She has no wheezes. She has rhonchi in the right lower field and the left upper field. She has no rales.  Lymphadenopathy:    She has no cervical adenopathy.  Skin: Skin is warm and dry.          Assessment & Plan:  Acute  Bronchitis:  Cough, congestion, fevers, wheezing x 4 weeks, no improvement in cough. Exam today suspicious for acute bronchitis. Given duration of symptoms coupled with presentation, will treat. Rx for Zpak and benzonate capsules sent to pharmacy. Fluids, rest, follow up PRN.  Sheral Flow, NP

## 2017-09-08 NOTE — Patient Instructions (Signed)
Start Azithromycin antibiotics for infection. Take 2 tablets by mouth today, then 1 tablet daily for 4 additional days.  You may take Benzonatate capsules for cough. Take 1 capsule by mouth three times daily as needed for cough.  Ensure you are staying hydrated with water and rest.  It was a pleasure to see you today!

## 2017-09-17 ENCOUNTER — Ambulatory Visit: Payer: Medicare Other

## 2017-09-17 ENCOUNTER — Ambulatory Visit (INDEPENDENT_AMBULATORY_CARE_PROVIDER_SITE_OTHER): Payer: Medicare Other | Admitting: Primary Care

## 2017-09-17 ENCOUNTER — Encounter: Payer: Self-pay | Admitting: Primary Care

## 2017-09-17 VITALS — BP 130/78 | HR 71 | Temp 97.9°F | Wt 192.5 lb

## 2017-09-17 DIAGNOSIS — F419 Anxiety disorder, unspecified: Secondary | ICD-10-CM | POA: Diagnosis not present

## 2017-09-17 DIAGNOSIS — E559 Vitamin D deficiency, unspecified: Secondary | ICD-10-CM

## 2017-09-17 DIAGNOSIS — Z1231 Encounter for screening mammogram for malignant neoplasm of breast: Secondary | ICD-10-CM

## 2017-09-17 DIAGNOSIS — F329 Major depressive disorder, single episode, unspecified: Secondary | ICD-10-CM | POA: Diagnosis not present

## 2017-09-17 DIAGNOSIS — F32A Depression, unspecified: Secondary | ICD-10-CM

## 2017-09-17 DIAGNOSIS — I4891 Unspecified atrial fibrillation: Secondary | ICD-10-CM | POA: Diagnosis not present

## 2017-09-17 DIAGNOSIS — E875 Hyperkalemia: Secondary | ICD-10-CM

## 2017-09-17 DIAGNOSIS — Z720 Tobacco use: Secondary | ICD-10-CM | POA: Diagnosis not present

## 2017-09-17 DIAGNOSIS — Z13220 Encounter for screening for lipoid disorders: Secondary | ICD-10-CM | POA: Diagnosis not present

## 2017-09-17 DIAGNOSIS — I10 Essential (primary) hypertension: Secondary | ICD-10-CM

## 2017-09-17 DIAGNOSIS — Z23 Encounter for immunization: Secondary | ICD-10-CM | POA: Diagnosis not present

## 2017-09-17 DIAGNOSIS — Z1239 Encounter for other screening for malignant neoplasm of breast: Secondary | ICD-10-CM

## 2017-09-17 LAB — POTASSIUM: POTASSIUM: 4.4 meq/L (ref 3.5–5.1)

## 2017-09-17 MED ORDER — ZOSTER VAC RECOMB ADJUVANTED 50 MCG/0.5ML IM SUSR: 0.5000 mL | Freq: Once | INTRAMUSCULAR | 1 refills | Status: AC

## 2017-09-17 NOTE — Assessment & Plan Note (Signed)
Recent lipid panel unremarkable. Not currently managed on medications.

## 2017-09-17 NOTE — Assessment & Plan Note (Signed)
Stable in the office today. Continue metoprolol and lisinopril. BMP with hyperkalemia on recent labs, will recheck labs today,.

## 2017-09-17 NOTE — Assessment & Plan Note (Signed)
Recent level of 33 on calcium and vitamin and D. Will have her start vitamin D 1000 units once daily.

## 2017-09-17 NOTE — Assessment & Plan Note (Signed)
Continues to smoke, took 5 days worth of Chantix before she stopped. Will have her restrart Chantix, she will update.

## 2017-09-17 NOTE — Progress Notes (Signed)
[id number] 

## 2017-09-17 NOTE — Patient Instructions (Addendum)
Stop by the lab prior to leaving today. I will notify you of your results once received.   Take the shingles vaccination to your pharmacy.  Call the [facility] to schedule your mammogram in February 2019.  You will be contacted regarding your referral to therapy.  Please let us know if you have not been contacted within one week.   Please update me once you restart Chantix. You must choose a quit date within the first week of re-initiation.  Start vitamin D 1000 units once daily. Continue calcium with vitamin D.  Start exercising. You should be getting 150 minutes of moderate intensity exercise weekly.  It's important to improve your diet by reducing consumption of fast food, fried food, processed snack foods, sugary drinks. Increase consumption of fresh vegetables and fruits, whole grains, water.  Ensure you are drinking 64 ounces of water daily.  Follow up in 1 year for your annual exam or sooner if needed.  It was a pleasure to see you today!   Preventive Care 98 Years and Older, Female Preventive care refers to lifestyle choices and visits with your health care provider that can promote health and wellness. What does preventive care include?  A yearly physical exam. This is also called an annual well check.  Dental exams once or twice a year.  Routine eye exams. Ask your health care provider how often you should have your eyes checked.  Personal lifestyle choices, including: ? Daily care of your teeth and gums. ? Regular physical activity. ? Eating a healthy diet. ? Avoiding tobacco and drug use. ? Limiting alcohol use. ? Practicing safe sex. ? Taking low-dose aspirin every day. ? Taking vitamin and mineral supplements as recommended by your health care provider. What happens during an annual well check? The services and screenings done by your health care provider during your annual well check will depend on your age, overall health, lifestyle risk factors,  and family history of disease. Counseling Your health care provider may ask you questions about your:  Alcohol use.  Tobacco use.  Drug use.  Emotional well-being.  Home and relationship well-being.  Sexual activity.  Eating habits.  History of falls.  Memory and ability to understand (cognition).  Work and work Statistician.  Reproductive health.  Screening You may have the following tests or measurements:  Height, weight, and BMI.  Blood pressure.  Lipid and cholesterol levels. These may be checked every 5 years, or more frequently if you are over 75 years old.  Skin check.  Lung cancer screening. You may have this screening every year starting at age 63 if you have a 30-pack-year history of smoking and currently smoke or have quit within the past 15 years.  Fecal occult blood test (FOBT) of the stool. You may have this test every year starting at age 19.  Flexible sigmoidoscopy or colonoscopy. You may have a sigmoidoscopy every 5 years or a colonoscopy every 10 years starting at age 56.  Hepatitis C blood test.  Hepatitis B blood test.  Sexually transmitted disease (STD) testing.  Diabetes screening. This is done by checking your blood sugar (glucose) after you have not eaten for a while (fasting). You may have this done every 1-3 years.  Bone density scan. This is done to screen for osteoporosis. You may have this done starting at age 35.  Mammogram. This may be done every 1-2 years. Talk to your health care provider about how often you should have regular mammograms.  Talk with your health care provider about your test results, treatment options, and if necessary, the need for more tests. Vaccines Your health care provider may recommend certain vaccines, such as:  Influenza vaccine. This is recommended every year.  Tetanus, diphtheria, and acellular pertussis (Tdap, Td) vaccine. You may need a Td booster every 10 years.  Varicella vaccine. You may  need this if you have not been vaccinated.  Zoster vaccine. You may need this after age 58.  Measles, mumps, and rubella (MMR) vaccine. You may need at least one dose of MMR if you were born in 1957 or later. You may also need a second dose.  Pneumococcal 13-valent conjugate (PCV13) vaccine. One dose is recommended after age 27.  Pneumococcal polysaccharide (PPSV23) vaccine. One dose is recommended after age 38.  Meningococcal vaccine. You may need this if you have certain conditions.  Hepatitis A vaccine. You may need this if you have certain conditions or if you travel or work in places where you may be exposed to hepatitis A.  Hepatitis B vaccine. You may need this if you have certain conditions or if you travel or work in places where you may be exposed to hepatitis B.  Haemophilus influenzae type b (Hib) vaccine. You may need this if you have certain conditions.  Talk to your health care provider about which screenings and vaccines you need and how often you need them. This information is not intended to replace advice given to you by your health care provider. Make sure you discuss any questions you have with your health care provider. Document Released: 10/04/2015 Document Revised: 05/27/2016 Document Reviewed: 07/09/2015 Elsevier Interactive Patient Education  [employer].

## 2017-09-17 NOTE — Progress Notes (Signed)
Subjective:    Patient ID: Stephanie [name], female    DOB: October 27, 1950, 66 y.o.   MRN: [id number]  HPI  [name] is a 66 year old female who presents today for [place] Part 2. She saw our health advisor last week.   Immunizations: -Tetanus: Unsure, insurance will not cover. -Influenza: Due today. -Pneumonia: Completed Prevnar in 2017, Pneumovax in 2016 -Shingles: Never completed  Colonoscopy: Completed in 2016, normal Dexa: Completed in 2018, normal. Mammogram: Completed February 2018, due in 2019 Hep C Screen: Negative in 2017   1) Essential Hypertension/Atrial Fibrillation: Currently managed on metoprolol succinate 50 mg, lisinopril 20 mg. Also managed on aspirin 81 mg. She denies chest pain, palpitations, tachycardia, shortness of breath.   BP Readings from Last 3 Encounters:  09/17/17 130/78  09/08/17 140/86  09/07/17 112/86   2) Anxiety and Depression: Currently managed on sertraline 50 mg. She has noticed a slight increase in anxiety and depression since she lost her job in October 2018. Overall she's able to manage but is interested in speaking with a therapist. She denies SI/HI.   Review of Systems  Constitutional: Negative for unexpected weight change.  HENT: Negative for rhinorrhea.   Respiratory: Negative for cough and shortness of breath.   Cardiovascular: Negative for chest pain.  Gastrointestinal: Negative for constipation and diarrhea.  Genitourinary: Negative for difficulty urinating and menstrual problem.  Musculoskeletal: Negative for arthralgias and myalgias.  Skin: Negative for rash.  Allergic/Immunologic: Negative for environmental allergies.  Neurological: Negative for dizziness, numbness and headaches.  Psychiatric/Behavioral:       See HPI       Past Medical History:  Diagnosis Date  . Restless leg      Social History   Socioeconomic History  . Marital status: Single    Spouse name: Not on file  . Number of children: Not on file  . Years  of education: Not on file  . Highest education level: Not on file  Social Needs  . Financial resource strain: Not on file  . Food insecurity - worry: Not on file  . Food insecurity - inability: Not on file  . Transportation needs - medical: Not on file  . Transportation needs - non-medical: Not on file  Occupational History  . Not on file  Tobacco Use  . Smoking status: Current Every Day Smoker    Packs/day: 1.00    Years: 40.00    Pack years: 40.00    Types: Cigarettes  . Smokeless tobacco: Never Used  Substance and Sexual Activity  . Alcohol use: Yes    Alcohol/week: 0.0 oz    Comment: social  . Drug use: No  . Sexual activity: No  Other Topics Concern  . Not on file  Social History Narrative   Divorced   Just moved from [place].   Originally from [place].   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    Past Surgical History:  Procedure Laterality Date  . TOTAL KNEE ARTHROPLASTY Left 07/16/2016    No family history on file.  Allergies  Allergen Reactions  . Codeine   . Pyridium  [Phenazopyridine Hcl]     Current Outpatient Medications on File Prior to Visit  Medication Sig Dispense Refill  . albuterol (PROVENTIL HFA;VENTOLIN HFA) 108 (90 Base) MCG/ACT inhaler Inhale 2 puffs into the lungs every 4 (four) hours as needed for shortness of breath. 1 Inhaler 0  . aspirin 81 MG tablet Take by  mouth.    . calcium citrate-vitamin D (CITRACAL+D) 315-200 MG-UNIT per tablet Take by mouth.    . Cholecalciferol (VITAMIN D3 PO) Take 1 tablet by mouth daily.    . Cyanocobalamin (VITAMIN B-12 PO) Take 1 tablet by mouth daily.    . metoprolol succinate (TOPROL-XL) 50 MG 24 hr tablet TAKE 1 TABLET (50 MG TOTAL) BY MOUTH DAILY. TAKE WITH OR IMMEDIATELY FOLLOWING A MEAL. 90 tablet 3  . sertraline (ZOLOFT) 50 MG tablet Take 1 tablet (50 mg total) by mouth daily. 90 tablet 3  . lisinopril (PRINIVIL,ZESTRIL) 20 MG tablet Take by mouth.      No current facility-administered medications on file prior to visit.     BP 130/78 (BP Location: Right Arm, Patient Position: Sitting, Cuff Size: Large)   Pulse 71   Temp 97.9 F (36.6 C) (Oral)   Wt 192 lb 8 oz (87.3 kg)   SpO2 97%   BMI 31.55 kg/m    Objective:   Physical Exam  Constitutional: She is oriented to person, place, and time. She appears well-nourished.  HENT:  Right Ear: Tympanic membrane and ear canal normal.  Left Ear: Tympanic membrane and ear canal normal.  Nose: Nose normal.  Mouth/Throat: Oropharynx is clear and moist.  Eyes: Conjunctivae and EOM are normal. Pupils are equal, round, and reactive to light.  Neck: Neck supple. No thyromegaly present.  Cardiovascular: Normal rate and regular rhythm.  No murmur heard. Pulmonary/Chest: Effort normal and breath sounds normal. She has no rales.  Abdominal: Soft. Bowel sounds are normal. There is no tenderness.  Musculoskeletal: Normal range of motion.  Lymphadenopathy:    She has no cervical adenopathy.  Neurological: She is alert and oriented to person, place, and time. She has normal reflexes. No cranial nerve deficit.  Skin: Skin is warm and dry. No rash noted.  Psychiatric: She has a normal mood and affect.          Assessment & Plan:

## 2017-09-17 NOTE — Assessment & Plan Note (Signed)
Doing well on Zoloft overall, does have some increased anxiety and depression as she recently lost her job. She is interested in therapy, referral placed.

## 2017-09-17 NOTE — Assessment & Plan Note (Signed)
Paroxysmal, denies palpitations and shortness of breath. Continue metoprolol and aspirin.

## 2017-09-17 NOTE — Addendum Note (Signed)
Addended by: Elmon Kirschner A on: 09/17/2017 11:28 AM   Modules accepted: Orders

## 2017-09-26 ENCOUNTER — Other Ambulatory Visit: Payer: Self-pay | Admitting: Primary Care

## 2017-09-26 DIAGNOSIS — I10 Essential (primary) hypertension: Secondary | ICD-10-CM

## 2017-09-26 DIAGNOSIS — F32A Depression, unspecified: Secondary | ICD-10-CM

## 2017-09-26 DIAGNOSIS — F419 Anxiety disorder, unspecified: Secondary | ICD-10-CM

## 2017-09-26 DIAGNOSIS — F329 Major depressive disorder, single episode, unspecified: Secondary | ICD-10-CM

## 2017-10-11 ENCOUNTER — Encounter: Payer: Self-pay | Admitting: Primary Care

## 2017-10-11 DIAGNOSIS — F329 Major depressive disorder, single episode, unspecified: Secondary | ICD-10-CM

## 2017-10-11 DIAGNOSIS — I10 Essential (primary) hypertension: Secondary | ICD-10-CM

## 2017-10-11 DIAGNOSIS — F419 Anxiety disorder, unspecified: Secondary | ICD-10-CM

## 2017-10-11 DIAGNOSIS — F32A Depression, unspecified: Secondary | ICD-10-CM

## 2017-10-12 MED ORDER — LISINOPRIL 20 MG PO TABS: 20.0000 mg | ORAL_TABLET | Freq: Every day | ORAL | 3 refills | Status: DC

## 2017-10-12 MED ORDER — SERTRALINE HCL 50 MG PO TABS: 50.0000 mg | ORAL_TABLET | Freq: Every day | ORAL | 3 refills | Status: DC

## 2017-10-12 MED ORDER — METOPROLOL SUCCINATE ER 50 MG PO TB24: ORAL_TABLET | ORAL | 3 refills | Status: DC

## 2017-10-12 NOTE — Telephone Encounter (Signed)
Ok to refill 

## 2017-10-13 ENCOUNTER — Telehealth: Payer: Self-pay | Admitting: *Deleted

## 2017-10-13 DIAGNOSIS — Z122 Encounter for screening for malignant neoplasm of respiratory organs: Secondary | ICD-10-CM

## 2017-10-13 DIAGNOSIS — Z87891 Personal history of nicotine dependence: Secondary | ICD-10-CM

## 2017-10-13 NOTE — Telephone Encounter (Signed)
Notified patient that annual lung cancer screening low dose CT scan is due currently or will be in near future. Confirmed that patient is within the age range of 55-77, and asymptomatic, (no signs or symptoms of lung cancer). Patient denies illness that would prevent curative treatment for lung cancer if found. Verified smoking history, (current, 41 pack year). The shared decision making visit was done 10/14/16. Patient is agreeable for CT scan being scheduled.

## 2017-10-15 ENCOUNTER — Encounter: Payer: Self-pay | Admitting: Primary Care

## 2017-10-22 ENCOUNTER — Ambulatory Visit: Admission: RE | Admit: 2017-10-22 | Payer: Medicare HMO | Source: Ambulatory Visit

## 2017-10-27 ENCOUNTER — Ambulatory Visit
Admission: RE | Admit: 2017-10-27 | Discharge: 2017-10-27 | Disposition: A | Discharge status: Home / Self Care | Payer: Medicare HMO | Source: Ambulatory Visit | Attending: Nurse Practitioner | Admitting: Nurse Practitioner

## 2017-10-27 DIAGNOSIS — J438 Other emphysema: Secondary | ICD-10-CM | POA: Diagnosis not present

## 2017-10-27 DIAGNOSIS — F17210 Nicotine dependence, cigarettes, uncomplicated: Secondary | ICD-10-CM | POA: Insufficient documentation

## 2017-10-27 DIAGNOSIS — R918 Other nonspecific abnormal finding of lung field: Secondary | ICD-10-CM | POA: Insufficient documentation

## 2017-10-27 DIAGNOSIS — Z122 Encounter for screening for malignant neoplasm of respiratory organs: Secondary | ICD-10-CM

## 2017-10-27 DIAGNOSIS — J432 Centrilobular emphysema: Secondary | ICD-10-CM | POA: Insufficient documentation

## 2017-10-27 DIAGNOSIS — I700 Atherosclerosis of aorta: Secondary | ICD-10-CM | POA: Insufficient documentation

## 2017-10-27 DIAGNOSIS — Z87891 Personal history of nicotine dependence: Secondary | ICD-10-CM

## 2017-11-01 ENCOUNTER — Ambulatory Visit: Payer: Medicare HMO | Admitting: Psychology

## 2017-11-01 ENCOUNTER — Encounter: Payer: Self-pay | Admitting: *Deleted

## 2017-11-16 ENCOUNTER — Encounter: Payer: Self-pay | Admitting: Primary Care

## 2017-11-16 ENCOUNTER — Ambulatory Visit (INDEPENDENT_AMBULATORY_CARE_PROVIDER_SITE_OTHER): Payer: Medicare HMO | Admitting: Primary Care

## 2017-11-16 VITALS — BP 118/72 | HR 98 | Temp 97.9°F | Ht 65.5 in | Wt 189.0 lb

## 2017-11-16 DIAGNOSIS — R002 Palpitations: Secondary | ICD-10-CM

## 2017-11-16 DIAGNOSIS — R202 Paresthesia of skin: Secondary | ICD-10-CM | POA: Diagnosis not present

## 2017-11-16 DIAGNOSIS — Z720 Tobacco use: Secondary | ICD-10-CM | POA: Diagnosis not present

## 2017-11-16 DIAGNOSIS — R55 Syncope and collapse: Secondary | ICD-10-CM | POA: Insufficient documentation

## 2017-11-16 DIAGNOSIS — R739 Hyperglycemia, unspecified: Secondary | ICD-10-CM | POA: Diagnosis not present

## 2017-11-16 DIAGNOSIS — I4891 Unspecified atrial fibrillation: Secondary | ICD-10-CM

## 2017-11-16 LAB — CBC
HEMATOCRIT: 43.7 % (ref 36.0–46.0)
Hemoglobin: 14.8 g/dL (ref 12.0–15.0)
MCHC: 33.8 g/dL (ref 30.0–36.0)
MCV: 93.9 fl (ref 78.0–100.0)
Platelets: 204 10*3/uL (ref 150.0–400.0)
RBC: 4.65 Mil/uL (ref 3.87–5.11)
RDW: 13.7 % (ref 11.5–15.5)
WBC: 5.6 10*3/uL (ref 4.0–10.5)

## 2017-11-16 LAB — BASIC METABOLIC PANEL
BUN: 17 mg/dL (ref 6–23)
CHLORIDE: 106 meq/L (ref 96–112)
CO2: 29 mEq/L (ref 19–32)
CREATININE: 0.96 mg/dL (ref 0.40–1.20)
Calcium: 9.5 mg/dL (ref 8.4–10.5)
GFR: 61.31 mL/min (ref >60.00)
GLUCOSE: 94 mg/dL (ref 70–99)
Potassium: 3.9 mEq/L (ref 3.5–5.1)
Sodium: 141 mEq/L (ref 135–145)

## 2017-11-16 LAB — TSH: TSH: 0.98 u[IU]/mL (ref 0.35–4.50)

## 2017-11-16 LAB — VITAMIN B12: Vitamin B-12: 460 pg/mL (ref 211–911)

## 2017-11-16 LAB — HEMOGLOBIN A1C: HEMOGLOBIN A1C: 5.4 % (ref 4.6–6.5)

## 2017-11-16 NOTE — Assessment & Plan Note (Signed)
Rate and rhythm regular today. Continue aspirin.

## 2017-11-16 NOTE — Patient Instructions (Addendum)
Stop by the lab prior to leaving today. I will notify you of your results once received.   The picture of your heart looks normal.   It was a pleasure to see you today!

## 2017-11-16 NOTE — Assessment & Plan Note (Signed)
Continues to smoke 1 PPD, discussed cessation today, she is not ready. Lung cancer screening in 2019 without suspicious masses.

## 2017-11-16 NOTE — Addendum Note (Signed)
Addended by: Ellamae Sia on: 11/16/2017 01:18 PM   Modules accepted: Orders

## 2017-11-16 NOTE — Assessment & Plan Note (Addendum)
Intermittent for 30 years, worst case last week. Neuro and cardiac exam unremarkable today. ECG today with NSR, rate of 83, no t-wave inversion, ST elevation/depression. Very similar to ECG in May 2018. Check labs today including CBC, TSH, B12, PTH, CMP, A1C.   Consider cardiology referral for Holter monitor evaluation and/or evaluation. Doesn't really seem like vertigo, but will keep on differential list. Consider neuro evaluation.    Strongly advised she stop smoking.

## 2017-11-16 NOTE — Progress Notes (Signed)
Subjective:    Patient ID: Stephanie [name], female    DOB: 1951/09/06, 67 y.o.   MRN: [id number]  HPI  Ms. Zuccaro is a 67 year old female with a history of atrial fibrillation, hypertension, anxiety, tobacco abuse, hyperlipidemia who presents today with a chief complaint of near syncope.   One week ago she was at the grocery store checking out, felt a sudden onset of hot flashes, dizzy/lightheaded, palpitations, weakness, full body tingling, headache. She had to lay down in the grocery store for about 45 minutes, then made it to her car, then had to call her family to pick her up. Her symptoms were present for the rest of the night.   Since then she's continued to notice a headache to the bilateral frontal and parietal lobes, overall feeling better.   She has a 30 year history of these symptoms, intermittently, which include hot flashes, feeling shaky, occasional dizziness, full body tingling, palpitations. This will occur if she doesn't eat breakfast or when overworking in the yard. Sometimes these symptoms will occur at rest. Her worst episode was last week when in the grocery store. Symptoms occur once monthly on average.   She denies chest pain, syncope, unilateral weakness, change in speech, visual changes, increased anxiety. She does not check her blood sugars. She does experience intermittent shortness of breath upon exertion, is smoking 1 PPD now.   She's undergone full cardiac work up in 2016 after diagnosed with Atrial Fibrillation. Her last stress test was in 2016, normal. She does have a history of migraines, doesn't think her episode Monday was a migraine.   Review of Systems  Constitutional: Positive for fatigue. Negative for fever.  Respiratory: Negative for shortness of breath.   Cardiovascular: Positive for palpitations. Negative for chest pain.  Neurological: Positive for dizziness, light-headedness and headaches. Negative for syncope.  Psychiatric/Behavioral: The patient  is not nervous/anxious.        Past Medical History:  Diagnosis Date  . Restless leg      Social History   Socioeconomic History  . Marital status: Single    Spouse name: Not on file  . Number of children: Not on file  . Years of education: Not on file  . Highest education level: Not on file  Social Needs  . Financial resource strain: Not on file  . Food insecurity - worry: Not on file  . Food insecurity - inability: Not on file  . Transportation needs - medical: Not on file  . Transportation needs - non-medical: Not on file  Occupational History  . Not on file  Tobacco Use  . Smoking status: Current Every Day Smoker    Packs/day: 1.00    Years: 40.00    Pack years: 40.00    Types: Cigarettes  . Smokeless tobacco: Never Used  Substance and Sexual Activity  . Alcohol use: Yes    Alcohol/week: 0.0 oz    Comment: social  . Drug use: No  . Sexual activity: No  Other Topics Concern  . Not on file  Social History Narrative   Divorced   Just moved from [place].   Originally from [place].   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    Past Surgical History:  Procedure Laterality Date  . TOTAL KNEE ARTHROPLASTY Left 07/16/2016    No family history on file.  Allergies  Allergen Reactions  . Codeine   . Pyridium  [Phenazopyridine Hcl]  Current Outpatient Medications on File Prior to Visit  Medication Sig Dispense Refill  . albuterol (PROVENTIL HFA;VENTOLIN HFA) 108 (90 Base) MCG/ACT inhaler Inhale 2 puffs into the lungs every 4 (four) hours as needed for shortness of breath. 1 Inhaler 0  . aspirin 81 MG tablet Take by mouth.    . calcium citrate-vitamin D (CITRACAL+D) 315-200 MG-UNIT per tablet Take by mouth.    . Cholecalciferol (VITAMIN D3 PO) Take 1,000 mg by mouth daily.     . Cyanocobalamin (VITAMIN B-12 PO) Take 1,000 mcg by mouth daily.     Marland Kitchen lisinopril (PRINIVIL,ZESTRIL) 20 MG tablet Take 1 tablet (20  mg total) by mouth daily. 90 tablet 3  . sertraline (ZOLOFT) 50 MG tablet Take 1 tablet (50 mg total) by mouth daily. 90 tablet 3   No current facility-administered medications on file prior to visit.     BP 118/72   Pulse 98   Temp 97.9 F (36.6 C) (Oral)   Ht 5' 5.5" (1.664 m)   Wt 189 lb (85.7 kg)   SpO2 96%   BMI 30.97 kg/m    Objective:   Physical Exam  Constitutional: She is oriented to person, place, and time. She appears well-nourished.  HENT:  Right Ear: Tympanic membrane and ear canal normal.  Left Ear: Tympanic membrane and ear canal normal.  Mouth/Throat: Oropharynx is clear and moist.  Eyes: EOM are normal. Pupils are equal, round, and reactive to light.  Neck: Neck supple. No thyromegaly present.  Cardiovascular: Normal rate and regular rhythm.  No murmur heard. Pulmonary/Chest: Effort normal and breath sounds normal. She has no rales.  Abdominal: Soft. Bowel sounds are normal. There is no tenderness.  Neurological: She is alert and oriented to person, place, and time. She has normal reflexes. No cranial nerve deficit.  Skin: Skin is warm and dry. No rash noted.  Psychiatric: She has a normal mood and affect.          Assessment & Plan:

## 2017-11-19 LAB — PROLACTIN: Prolactin: 5.5 ng/mL

## 2017-11-19 LAB — ACTH: C206 ACTH: 18 pg/mL (ref 6–50)

## 2017-11-22 ENCOUNTER — Encounter: Payer: Self-pay | Admitting: Primary Care

## 2017-11-22 NOTE — Telephone Encounter (Signed)
Spoken to patient. She stated that she is willing to see a cardiologist.

## 2017-11-30 ENCOUNTER — Ambulatory Visit: Payer: Medicare Other | Admitting: Internal Medicine

## 2017-11-30 ENCOUNTER — Encounter: Payer: Self-pay | Admitting: Primary Care

## 2017-11-30 ENCOUNTER — Ambulatory Visit (INDEPENDENT_AMBULATORY_CARE_PROVIDER_SITE_OTHER): Payer: Medicare HMO | Admitting: Primary Care

## 2017-11-30 VITALS — BP 118/74 | HR 95 | Temp 97.7°F | Ht 65.5 in | Wt 187.2 lb

## 2017-11-30 DIAGNOSIS — R55 Syncope and collapse: Secondary | ICD-10-CM | POA: Diagnosis not present

## 2017-11-30 DIAGNOSIS — K625 Hemorrhage of anus and rectum: Secondary | ICD-10-CM | POA: Diagnosis not present

## 2017-11-30 DIAGNOSIS — R197 Diarrhea, unspecified: Secondary | ICD-10-CM | POA: Diagnosis not present

## 2017-11-30 DIAGNOSIS — I4891 Unspecified atrial fibrillation: Secondary | ICD-10-CM

## 2017-11-30 DIAGNOSIS — R1032 Left lower quadrant pain: Secondary | ICD-10-CM | POA: Diagnosis not present

## 2017-11-30 LAB — HEMOCCULT GUIAC POC 1CARD (OFFICE): FECAL OCCULT BLD: NEGATIVE

## 2017-11-30 MED ORDER — CIPROFLOXACIN HCL 500 MG PO TABS: 500.0000 mg | ORAL_TABLET | Freq: Two times a day (BID) | ORAL | 0 refills | Status: DC

## 2017-11-30 MED ORDER — METRONIDAZOLE 500 MG PO TABS
500.0000 mg | ORAL_TABLET | Freq: Three times a day (TID) | ORAL | 0 refills | Status: DC
Start: 2017-11-30 — End: 2018-01-24

## 2017-11-30 NOTE — Progress Notes (Signed)
Subjective:    Patient ID: Stephanie [name], female    DOB: 05-Sep-1951, 67 y.o.   MRN: [id number]  HPI  Stephanie [name] is a 67 year old female with a history of IBS diarrhea type who presents today with a chief complaint of diarrhea and left lower quadrant abdominal cramping.  Her symptoms began four days ago and have been persistent since. She noticed bright red blood on the toilet paper several times on Sunday after wiping. Monday (yesterday) she didn't have bright red blood, but did notice more formed and darker stools. She took [employer] Sunday.   She denies nausea, vomiting, chills, fevers. She's experiencing more than 10 episodes of diarrhea daily. Her diarrhea was more formed yesterday. She has a history of IBS diarrhea type, but this diarrhea is much different, and she doesn't typically experience abdominal cramping.   Her last colonoscopy was in 2016, she was told that she has "pockets" in her colon and to avoid peanuts, otherwise unremarkable.   Review of Systems  Constitutional: Positive for fatigue. Negative for fever.  Gastrointestinal: Positive for abdominal pain, blood in stool and diarrhea. Negative for nausea.       Past Medical History:  Diagnosis Date  . Restless leg      Social History   Socioeconomic History  . Marital status: Single    Spouse name: Not on file  . Number of children: Not on file  . Years of education: Not on file  . Highest education level: Not on file  Social Needs  . Financial resource strain: Not on file  . Food insecurity - worry: Not on file  . Food insecurity - inability: Not on file  . Transportation needs - medical: Not on file  . Transportation needs - non-medical: Not on file  Occupational History  . Not on file  Tobacco Use  . Smoking status: Current Every Day Smoker    Packs/day: 1.00    Years: 40.00    Pack years: 40.00    Types: Cigarettes  . Smokeless tobacco: Never Used  Substance and Sexual Activity  . Alcohol  use: Yes    Alcohol/week: 0.0 oz    Comment: social  . Drug use: No  . Sexual activity: No  Other Topics Concern  . Not on file  Social History Narrative   Divorced   Just moved from [place].   Originally from [place].   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    Past Surgical History:  Procedure Laterality Date  . TOTAL KNEE ARTHROPLASTY Left 07/16/2016    No family history on file.  Allergies  Allergen Reactions  . Codeine   . Pyridium  [Phenazopyridine Hcl]     Current Outpatient Medications on File Prior to Visit  Medication Sig Dispense Refill  . albuterol (PROVENTIL HFA;VENTOLIN HFA) 108 (90 Base) MCG/ACT inhaler Inhale 2 puffs into the lungs every 4 (four) hours as needed for shortness of breath. 1 Inhaler 0  . aspirin 81 MG tablet Take by mouth.    . calcium citrate-vitamin D (CITRACAL+D) 315-200 MG-UNIT per tablet Take by mouth.    . Cholecalciferol (VITAMIN D3 PO) Take 1,000 mg by mouth daily.     . Cyanocobalamin (VITAMIN B-12 PO) Take 1,000 mcg by mouth daily.     Marland Kitchen lisinopril (PRINIVIL,ZESTRIL) 20 MG tablet Take 1 tablet (20 mg total) by mouth daily. 90 tablet 3  . sertraline (ZOLOFT) 50 MG tablet Take 1  tablet (50 mg total) by mouth daily. 90 tablet 3   No current facility-administered medications on file prior to visit.     BP 118/74   Pulse 95   Temp 97.7 F (36.5 C) (Oral)   Ht 5' 5.5" (1.664 m)   Wt 187 lb 4 oz (84.9 kg)   SpO2 98%   BMI 30.69 kg/m    Objective:   Physical Exam  Constitutional: She appears well-nourished.  Neck: Neck supple.  Cardiovascular: Normal rate and regular rhythm.  Pulmonary/Chest: Effort normal and breath sounds normal.  Abdominal: Soft. Bowel sounds are increased. There is tenderness in the right upper quadrant and left lower quadrant.  Genitourinary: Rectal exam shows external hemorrhoid. Rectal exam shows guaiac negative stool.  Genitourinary Comments: Small  non thrombosed hemorrhoid   Skin: Skin is warm and dry.          Assessment & Plan:  Acute Diverticulitis:  History of diverticulosis on colonoscopy, per patient (will request records). HPI and exam very suspicious for acute infection. Hemoccult stool card negative, suspect bright rectal bleeding secondary to either hemorrhoid or acute infection. Rx for Cipro and Flagyl courses sent to pharmacy.  Discussed for her to notify me if no improvement in 3 days.  Stephanie Koch, NP

## 2017-11-30 NOTE — Assessment & Plan Note (Signed)
Rate and rhythm regular today, although could be experiencing paroxysmal atrial fibrillation that could be contributing to her near syncope symptoms. Referral placed to cardiology today.

## 2017-11-30 NOTE — Patient Instructions (Signed)
Start ciprofloxacin antibiotics for the infection. Take 1 tablet by mouth twice daily for 10 days.  Start metronidazole 500 mg tablets for infection. Take 1 tablet by mouth three times daily for infection.  Stop by the front desk and speak with either Rosaria Ferries or Shirlean Mylar regarding your referral to cardiology.   It was a pleasure to see you today!

## 2017-11-30 NOTE — Assessment & Plan Note (Signed)
Patient agreeable to cardiology evaluation as recommended. Consider for Holter monitor and/or stress test. Referral placed.

## 2018-01-24 ENCOUNTER — Encounter: Payer: Self-pay | Admitting: Primary Care

## 2018-01-24 ENCOUNTER — Ambulatory Visit (INDEPENDENT_AMBULATORY_CARE_PROVIDER_SITE_OTHER): Payer: Medicare HMO | Admitting: Primary Care

## 2018-01-24 DIAGNOSIS — K529 Noninfective gastroenteritis and colitis, unspecified: Secondary | ICD-10-CM | POA: Diagnosis not present

## 2018-01-24 MED ORDER — DICYCLOMINE HCL 10 MG PO CAPS
ORAL_CAPSULE | ORAL | 0 refills | Status: DC
Start: 2018-01-24 — End: 2018-05-11

## 2018-01-24 NOTE — Progress Notes (Signed)
Subjective:    Patient ID: Stephanie [name], female    DOB: [birth year], 67 y.o.   MRN: [id number]  HPI  Stephanie [name] is a 67 year old female who presents today with a chief complaint of diarrhea.   Chronic for the past 14 years since gall bladder removal. She's experiencing diarrhea 10+ times daily most everyday. Her stool consistency is soft and watery, she'll mostly have small amounts, sometimes larger amounts of stool. Over the past one year her symptoms have progressed, she's now having to wear pads due to fecal urgency.   She'll experience abdominal cramping to her mid lower abdomen that occurs just prior to her diarrhea. She'll also experience nausea at times. She denies bloody stools, fevers, recent antibiotic use. She'll occasionally take Pepto Bismol.   Overall her diarrhea and symptoms have prevented her from going out for prolonged periods of time. She recently started work and has had a few "accidents" in her underwear and has had to change her clothing  She avoids spicy/irritative foods but will still experience symptoms with regular food. She does drink coffee and smoke. She's been told in the past that she has IBS but has never undergone treatment.  Review of Systems  Constitutional: Negative for chills, fatigue and fever.  Gastrointestinal: Positive for abdominal pain and diarrhea. Negative for blood in stool and nausea.  Skin: Negative for color change.  Neurological: Negative for weakness.       Past Medical History:  Diagnosis Date  . Chronic diarrhea   . Diverticulosis   . Restless leg      Social History   Socioeconomic History  . Marital status: Single    Spouse name: Not on file  . Number of children: Not on file  . Years of education: Not on file  . Highest education level: Not on file  Occupational History  . Not on file  Social Needs  . Financial resource strain: Not on file  . Food insecurity:    Worry: Not on file    Inability: Not on file  .  Transportation needs:    Medical: Not on file    Non-medical: Not on file  Tobacco Use  . Smoking status: Current Every Day Smoker    Packs/day: 1.00    Years: 40.00    Pack years: 40.00    Types: Cigarettes  . Smokeless tobacco: Never Used  Substance and Sexual Activity  . Alcohol use: Yes    Alcohol/week: 0.0 oz    Comment: social  . Drug use: No  . Sexual activity: Never  Lifestyle  . Physical activity:    Days per week: Not on file    Minutes per session: Not on file  . Stress: Not on file  Relationships  . Social connections:    Talks on phone: Not on file    Gets together: Not on file    Attends religious service: Not on file    Active member of club or organization: Not on file    Attends meetings of clubs or organizations: Not on file    Relationship status: Not on file  . Intimate partner violence:    Fear of current or ex partner: Not on file    Emotionally abused: Not on file    Physically abused: Not on file    Forced sexual activity: Not on file  Other Topics Concern  . Not on file  Social History Narrative   Divorced   Just moved from [place].  Originally from [place].   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    Past Surgical History:  Procedure Laterality Date  . CHOLECYSTECTOMY  2004  . TOTAL KNEE ARTHROPLASTY Left 07/16/2016    No family history on file.  Allergies  Allergen Reactions  . Codeine   . Pyridium  [Phenazopyridine Hcl]     Current Outpatient Medications on File Prior to Visit  Medication Sig Dispense Refill  . albuterol (PROVENTIL HFA;VENTOLIN HFA) 108 (90 Base) MCG/ACT inhaler Inhale 2 puffs into the lungs every 4 (four) hours as needed for shortness of breath. 1 Inhaler 0  . aspirin 81 MG tablet Take by mouth.    . calcium citrate-vitamin D (CITRACAL+D) 315-200 MG-UNIT per tablet Take by mouth.    . Cholecalciferol (VITAMIN D3 PO) Take 1,000 mg by mouth daily.     .  Cyanocobalamin (VITAMIN B-12 PO) Take 1,000 mcg by mouth daily.     Marland Kitchen lisinopril (PRINIVIL,ZESTRIL) 20 MG tablet Take 1 tablet (20 mg total) by mouth daily. 90 tablet 3  . sertraline (ZOLOFT) 50 MG tablet Take 1 tablet (50 mg total) by mouth daily. 90 tablet 3   No current facility-administered medications on file prior to visit.     BP 120/70   Pulse 82   Temp 98.1 F (36.7 C) (Oral)   Ht 5' 5.5" (1.664 m)   Wt 181 lb (82.1 kg)   BMI 29.66 kg/m    Objective:   Physical Exam  Constitutional: She appears well-nourished.  Cardiovascular: Normal rate and regular rhythm.  Pulmonary/Chest: Effort normal and breath sounds normal.  Abdominal: Soft. Bowel sounds are normal. There is no tenderness.  Skin: Skin is warm and dry.          Assessment & Plan:

## 2018-01-24 NOTE — Patient Instructions (Addendum)
Try dicyclomine 10 mg capsules for diarrhea and cramping. Take 1 capsule by mouth prior to each meal as needed.  Please update me in 2 weeks.  It was a pleasure to see you today!    Diet for Irritable Bowel Syndrome When you have irritable bowel syndrome (IBS), the foods you eat and your eating habits are very important. IBS may cause various symptoms, such as abdominal pain, constipation, or diarrhea. Choosing the right foods can help ease discomfort caused by these symptoms. Work with your health care provider and dietitian to find the best eating plan to help control your symptoms. What general guidelines do I need to follow?  Keep a food diary. This will help you identify foods that cause symptoms. Write down: ? What you eat and when. ? What symptoms you have. ? When symptoms occur in relation to your meals.  Avoid foods that cause symptoms. Talk with your dietitian about other ways to get the same nutrients that are in these foods.  Eat more foods that contain fiber. Take a fiber supplement if directed by your dietitian.  Eat your meals slowly, in a relaxed setting.  Aim to eat 5-6 small meals per day. Do not skip meals.  Drink enough fluids to keep your urine clear or pale yellow.  Ask your health care provider if you should take an over-the-counter probiotic during flare-ups to help restore healthy gut bacteria.  If you have cramping or diarrhea, try making your meals low in fat and high in carbohydrates. Examples of carbohydrates are pasta, rice, whole grain breads and cereals, fruits, and vegetables.  If dairy products cause your symptoms to flare up, try eating less of them. You might be able to handle yogurt better than other dairy products because it contains bacteria that help with digestion. What foods are not recommended? The following are some foods and drinks that may worsen your symptoms:  Fatty foods, such as [place] fries.  Milk products, such as cheese or ice  cream.  Chocolate.  Alcohol.  Products with caffeine, such as coffee.  Carbonated drinks, such as soda.  The items listed above may not be a complete list of foods and beverages to avoid. Contact your dietitian for more information. What foods are good sources of fiber? Your health care provider or dietitian may recommend that you eat more foods that contain fiber. Fiber can help reduce constipation and other IBS symptoms. Add foods with fiber to your diet a little at a time so that your body can get used to them. Too much fiber at once might cause gas and swelling of your abdomen. The following are some foods that are good sources of fiber:  Apples.  Peaches.  Pears.  Berries.  Figs.  Broccoli (raw).  Cabbage.  Carrots.  Raw peas.  Kidney beans.  Lima beans.  Whole grain bread.  Whole grain cereal.  Where to find more information: [employer] for Functional Gastrointestinal Disorders: www.iffgd.[employer] of Diabetes and Digestive and Kidney Diseases: NetworkAffair.co.za.aspx This information is not intended to replace advice given to you by your health care provider. Make sure you discuss any questions you have with your health care provider. Document Released: 11/28/2003 Document Revised: 02/13/2016 Document Reviewed: 12/08/2013 Elsevier Interactive Patient Education  2018 [employer].

## 2018-01-24 NOTE — Assessment & Plan Note (Signed)
With associated symptoms of abdominal cramping and intermittent nausea. Suspect IBS diarrhea type, especially given chronicity and symptoms.  Colonoscopy in 2016 with diverticulosis, otherwise unremarkable.   Discussed triggers for IBS including tobacco abuse, coffee, spicy/acidic foods. Diet plan printed and provided today.  Will trial low dose Bentyl 10 mg PRN. She will update in 2-3 weeks. Consider GI referral in the future. Do not suspect C-Diff or acute infection.

## 2018-02-01 ENCOUNTER — Ambulatory Visit: Payer: Medicare HMO | Admitting: Cardiovascular Disease

## 2018-05-02 ENCOUNTER — Other Ambulatory Visit: Payer: Self-pay

## 2018-05-02 ENCOUNTER — Emergency Department: Payer: Medicare HMO

## 2018-05-02 ENCOUNTER — Emergency Department
Admission: EM | Admit: 2018-05-02 | Discharge: 2018-05-02 | Disposition: A | Discharge status: Home / Self Care | Payer: Medicare HMO | Attending: Emergency Medicine | Admitting: Emergency Medicine

## 2018-05-02 ENCOUNTER — Encounter: Payer: Self-pay | Admitting: *Deleted

## 2018-05-02 DIAGNOSIS — Z7982 Long term (current) use of aspirin: Secondary | ICD-10-CM | POA: Diagnosis not present

## 2018-05-02 DIAGNOSIS — I4891 Unspecified atrial fibrillation: Secondary | ICD-10-CM | POA: Diagnosis not present

## 2018-05-02 DIAGNOSIS — R000 Tachycardia, unspecified: Secondary | ICD-10-CM | POA: Diagnosis present

## 2018-05-02 DIAGNOSIS — F17210 Nicotine dependence, cigarettes, uncomplicated: Secondary | ICD-10-CM | POA: Insufficient documentation

## 2018-05-02 DIAGNOSIS — I10 Essential (primary) hypertension: Secondary | ICD-10-CM | POA: Insufficient documentation

## 2018-05-02 DIAGNOSIS — Z79899 Other long term (current) drug therapy: Secondary | ICD-10-CM | POA: Insufficient documentation

## 2018-05-02 LAB — CBC
HCT: 43.7 % (ref 35.0–47.0)
Hemoglobin: 15.2 g/dL (ref 12.0–16.0)
MCH: 32.7 pg (ref 26.0–34.0)
MCHC: 34.9 g/dL (ref 32.0–36.0)
MCV: 93.6 fL (ref 80.0–100.0)
Platelets: 224 10*3/uL (ref 150–440)
RBC: 4.66 MIL/uL (ref 3.80–5.20)
RDW: 13.3 % (ref 11.5–14.5)
WBC: 6.8 10*3/uL (ref 3.6–11.0)

## 2018-05-02 LAB — BASIC METABOLIC PANEL
Anion gap: 7 (ref 5–15)
BUN: 19 mg/dL (ref 8–23)
CHLORIDE: 108 mmol/L (ref 98–111)
CO2: 23 mmol/L (ref 22–32)
CREATININE: 0.95 mg/dL (ref 0.44–1.00)
Calcium: 8.7 mg/dL — ABNORMAL LOW (ref 8.9–10.3)
GFR calc Af Amer: >60 mL/min (ref >60)
GFR, EST NON AFRICAN AMERICAN: 60 mL/min — AB (ref >60)
Glucose, Bld: 100 mg/dL — ABNORMAL HIGH (ref 70–99)
Potassium: 3.6 mmol/L (ref 3.5–5.1)
SODIUM: 138 mmol/L (ref 135–145)

## 2018-05-02 LAB — APTT: aPTT: 29 seconds (ref 24–36)

## 2018-05-02 LAB — T4, FREE: Free T4: 0.9 ng/dL (ref 0.82–1.77)

## 2018-05-02 LAB — TSH: TSH: 1.018 u[IU]/mL (ref 0.350–4.500)

## 2018-05-02 LAB — TROPONIN I: Troponin I: <0.03 ng/mL (ref <0.03)

## 2018-05-02 LAB — PROTIME-INR
INR: 0.91
Prothrombin Time: 12.2 seconds (ref 11.4–15.2)

## 2018-05-02 MED ORDER — DILTIAZEM HCL 60 MG PO TABS
30.0000 mg | ORAL_TABLET | Freq: Once | ORAL | Status: AC
  Administered 2018-05-02: 30 mg via ORAL
  Filled 2018-05-02 (×2): qty 1

## 2018-05-02 MED ORDER — DILTIAZEM HCL ER COATED BEADS 120 MG PO CP24: 120.0000 mg | ORAL_CAPSULE | Freq: Every day | ORAL | 11 refills | Status: DC

## 2018-05-02 MED ORDER — DILTIAZEM HCL 25 MG/5ML IV SOLN
20.0000 mg | Freq: Once | INTRAVENOUS | Status: AC
  Administered 2018-05-02: 20 mg via INTRAVENOUS
  Filled 2018-05-02: qty 5

## 2018-05-02 MED ORDER — SODIUM CHLORIDE 0.9 % IV SOLN
Freq: Once | INTRAVENOUS | Status: AC
  Administered 2018-05-02: 17:00:00 via INTRAVENOUS

## 2018-05-02 NOTE — ED Triage Notes (Signed)
Pt to triage via wheelchair.  Pt sent from [facility] for eval of chest tightness.  Pt reports dizziness, nausea. Tightness in center of chest.   Sx began 2 days ago.  Pt alert. Speech clear.

## 2018-05-02 NOTE — ED Provider Notes (Signed)
[facility] Emergency Department Provider Note       Time seen: ----------------------------------------- 4:31 PM on 05/02/2018 -----------------------------------------   I have reviewed the triage vital signs and the nursing notes.  HISTORY   Chief Complaint Dizziness and Chest Pain    HPI Stephanie [name] is a 67 y.o. female with a history of chronic diarrhea, diverticulosis and restless leg with occasional atrial fibrillation who presents to the ED for tachycardia.  She is also had some associated dizziness with nausea and chest tightness.  Patient typically has intermittent bouts of atrial fibrillation with most recent event occurring in March.  She states typically they last for a day but resolved spontaneously.  She does not take any rate controlling medicine or blood thinners at this time.  She has not seen a cardiologist in 4 years.  She denies any recent illness or other complaints.  The reason she is here is that it has lasted for 3 days which is the longest this arrhythmia has ever gone on.  Past Medical History:  Diagnosis Date  . Chronic diarrhea   . Diverticulosis   . Restless leg     Patient Active Problem List   Diagnosis Date Noted  . Chronic diarrhea 01/24/2018  . Near syncope 11/16/2017  . Medicare annual wellness visit, subsequent 09/07/2017  . Screening for hyperlipidemia 09/07/2017  . Vitamin D deficiency 09/07/2017  . Personal history of tobacco use, presenting hazards to health 10/15/2016  . Chronic knee pain 06/08/2016  . Medicare annual wellness visit, initial 08/23/2015  . Restless leg syndrome 08/23/2015  . Essential hypertension 05/24/2015  . Anxiety and depression 05/24/2015  . Tobacco abuse 05/24/2015  . Atrial fibrillation ([place]) 05/24/2015    Past Surgical History:  Procedure Laterality Date  . CHOLECYSTECTOMY  2004  . TOTAL KNEE ARTHROPLASTY Left 07/16/2016    Allergies Codeine and Pyridium  [phenazopyridine  hcl]  Social History Social History   Tobacco Use  . Smoking status: Current Every Day Smoker    Packs/day: 1.00    Years: 40.00    Pack years: 40.00    Types: Cigarettes  . Smokeless tobacco: Never Used  Substance Use Topics  . Alcohol use: Yes    Alcohol/week: 0.0 standard drinks    Comment: social  . Drug use: No   Review of Systems Constitutional: Negative for fever. Cardiovascular: Positive for palpitations, chest tightness Respiratory: Negative for shortness of breath. Gastrointestinal: Negative for abdominal pain, vomiting and diarrhea. Musculoskeletal: Negative for back pain. Skin: Negative for rash. Neurological: Negative for headaches, positive for weakness  All systems negative/normal/unremarkable except as stated in the HPI  ____________________________________________   PHYSICAL EXAM:  VITAL SIGNS: ED Triage Vitals  Enc Vitals Group     BP --      Pulse Rate 05/02/18 1622 91     Resp 05/02/18 1622 20     Temp 05/02/18 1622 98.6 F (37 C)     Temp Source 05/02/18 1622 Oral     SpO2 05/02/18 1622 98 %     Weight 05/02/18 1620 173 lb (78.5 kg)     Height 05/02/18 1620 5\' 4"  (1.626 m)     Head Circumference --      Peak Flow --      Pain Score 05/02/18 1620 2     Pain Loc --      Pain Edu? --      Excl. in [place]? --    Constitutional: Alert and oriented. Well appearing  and in no distress. Eyes: Conjunctivae are normal. Normal extraocular movements. ENT   Head: Normocephalic and atraumatic.   Nose: No congestion/rhinnorhea.   Mouth/Throat: Mucous membranes are moist.   Neck: No stridor. Cardiovascular: Irregularly irregular rhythm. No murmurs, rubs, or gallops. Respiratory: Normal respiratory effort without tachypnea nor retractions. Breath sounds are clear and equal bilaterally. No wheezes/rales/rhonchi. Gastrointestinal: Soft and nontender. Normal bowel sounds Musculoskeletal: Nontender with normal range of motion in extremities. No  lower extremity tenderness nor edema. Neurologic:  Normal speech and language. No gross focal neurologic deficits are appreciated.  Skin:  Skin is warm, dry and intact. No rash noted. Psychiatric: Mood and affect are normal. Speech and behavior are normal.  ____________________________________________  EKG: Interpreted by me.  Atrial fibrillation with a rapid ventricular response, rate is 174 bpm, nonspecific ST segment changes, normal QRS, normal QT  ____________________________________________  ED COURSE:  As part of my medical decision making, I reviewed the following data within the [place] [facility] History obtained from family if available, nursing notes, old chart and ekg, as well as notes from prior ED visits. Patient presented for tachycardia, dizziness and nausea and was found to be in A. fib with RVR, we will assess with labs and imaging as indicated at this time.  Patient will require IV Cardizem for rate control. Clinical Course as of May 02 1821  LFY May 02, 2018  1702 Patient is currently rate controlled after IV Cardizem 20 mg   [JW]    Clinical Course User Index [JW] Earleen Newport, MD   Procedures ____________________________________________   LABS (pertinent positives/negatives)  Labs Reviewed  BASIC METABOLIC PANEL - Abnormal; Notable for the following components:      Result Value   Glucose, Bld 100 (*)    Calcium 8.7 (*)    GFR calc non Af Amer 60 (*)    All other components within normal limits  CBC  TROPONIN I  PROTIME-INR  APTT  TSH  T4, FREE   CRITICAL CARE Performed by: Laurence Aly   Total critical care time: 30 minutes  Critical care time was exclusive of separately billable procedures and treating other patients.  Critical care was necessary to treat or prevent imminent or life-threatening deterioration.  Critical care was time spent personally by me on the following activities: development of treatment plan with  patient and/or surrogate as well as nursing, discussions with consultants, evaluation of patient's response to treatment, examination of patient, obtaining history from patient or surrogate, ordering and performing treatments and interventions, ordering and review of laboratory studies, ordering and review of radiographic studies, pulse oximetry and re-evaluation of patient's condition.  RADIOLOGY Images were viewed by me  CXR Is unremarkable ____________________________________________  DIFFERENTIAL DIAGNOSIS   Arrhythmia, electrolyte abnormality, unstable angina, MI, hyperthyroidism  FINAL ASSESSMENT AND PLAN  Chest tightness, rapid atrial fibrillation   Plan: The patient had presented for atrial fibrillation with a rapid ventricular response, she has a history of similar that usually resolve spontaneously. Patient's labs are reassuring as well as her imaging.  Her heart rate improved dramatically after IV Cardizem and we gave her oral Cardizem as well.  She remains normotensive and feeling better.  I will discuss with cardiology for close outpatient follow-up.  We will start her on Cardizem CD to prevent any further tachyarrhythmias.   Laurence Aly, MD   Note: This note was generated in part or whole with voice recognition software. Voice recognition is usually quite accurate but  there are transcription errors that can and very often do occur. I apologize for any typographical errors that were not detected and corrected.     Earleen Newport, MD 05/02/18 [phone]

## 2018-05-02 NOTE — ED Notes (Addendum)
Pt arrived in afib RVR. MD at bedside.

## 2018-05-03 ENCOUNTER — Ambulatory Visit: Payer: Self-pay | Admitting: Internal Medicine

## 2018-05-11 ENCOUNTER — Observation Stay
Admission: EM | Admit: 2018-05-11 | Discharge: 2018-05-13 | Disposition: A | Discharge status: Home / Self Care | Payer: Medicare HMO | Attending: Internal Medicine | Admitting: Internal Medicine

## 2018-05-11 ENCOUNTER — Other Ambulatory Visit: Payer: Self-pay

## 2018-05-11 DIAGNOSIS — F419 Anxiety disorder, unspecified: Secondary | ICD-10-CM | POA: Insufficient documentation

## 2018-05-11 DIAGNOSIS — Z7901 Long term (current) use of anticoagulants: Secondary | ICD-10-CM | POA: Diagnosis not present

## 2018-05-11 DIAGNOSIS — J449 Chronic obstructive pulmonary disease, unspecified: Secondary | ICD-10-CM | POA: Insufficient documentation

## 2018-05-11 DIAGNOSIS — F17210 Nicotine dependence, cigarettes, uncomplicated: Secondary | ICD-10-CM | POA: Insufficient documentation

## 2018-05-11 DIAGNOSIS — Z885 Allergy status to narcotic agent status: Secondary | ICD-10-CM | POA: Insufficient documentation

## 2018-05-11 DIAGNOSIS — Z888 Allergy status to other drugs, medicaments and biological substances status: Secondary | ICD-10-CM | POA: Diagnosis not present

## 2018-05-11 DIAGNOSIS — E739 Lactose intolerance, unspecified: Secondary | ICD-10-CM | POA: Diagnosis not present

## 2018-05-11 DIAGNOSIS — G2581 Restless legs syndrome: Secondary | ICD-10-CM | POA: Insufficient documentation

## 2018-05-11 DIAGNOSIS — E785 Hyperlipidemia, unspecified: Secondary | ICD-10-CM | POA: Diagnosis not present

## 2018-05-11 DIAGNOSIS — Z96652 Presence of left artificial knee joint: Secondary | ICD-10-CM | POA: Diagnosis not present

## 2018-05-11 DIAGNOSIS — Z79899 Other long term (current) drug therapy: Secondary | ICD-10-CM | POA: Diagnosis not present

## 2018-05-11 DIAGNOSIS — I4891 Unspecified atrial fibrillation: Secondary | ICD-10-CM | POA: Diagnosis present

## 2018-05-11 DIAGNOSIS — I10 Essential (primary) hypertension: Secondary | ICD-10-CM | POA: Insufficient documentation

## 2018-05-11 DIAGNOSIS — I480 Paroxysmal atrial fibrillation: Principal | ICD-10-CM | POA: Insufficient documentation

## 2018-05-11 HISTORY — DX: Unspecified atrial fibrillation: I48.91

## 2018-05-11 HISTORY — DX: Essential (primary) hypertension: I10

## 2018-05-11 LAB — TROPONIN I
Troponin I: <0.03 ng/mL (ref <0.03)
Troponin I: <0.03 ng/mL (ref <0.03)

## 2018-05-11 LAB — CBC
HCT: 43.4 % (ref 35.0–47.0)
Hemoglobin: 15 g/dL (ref 12.0–16.0)
MCH: 32.3 pg (ref 26.0–34.0)
MCHC: 34.5 g/dL (ref 32.0–36.0)
MCV: 93.7 fL (ref 80.0–100.0)
Platelets: 221 10*3/uL (ref 150–440)
RBC: 4.64 MIL/uL (ref 3.80–5.20)
RDW: 13.5 % (ref 11.5–14.5)
WBC: 5.9 10*3/uL (ref 3.6–11.0)

## 2018-05-11 LAB — BASIC METABOLIC PANEL
Anion gap: 10 (ref 5–15)
BUN: 16 mg/dL (ref 8–23)
CALCIUM: 9.2 mg/dL (ref 8.9–10.3)
CO2: 23 mmol/L (ref 22–32)
CREATININE: 1.06 mg/dL — AB (ref 0.44–1.00)
Chloride: 109 mmol/L (ref 98–111)
GFR calc Af Amer: >60 mL/min (ref >60)
GFR calc non Af Amer: 52 mL/min — ABNORMAL LOW (ref >60)
Glucose, Bld: 94 mg/dL (ref 70–99)
Potassium: 3.8 mmol/L (ref 3.5–5.1)
Sodium: 142 mmol/L (ref 135–145)

## 2018-05-11 LAB — TSH: TSH: 0.88 u[IU]/mL (ref 0.350–4.500)

## 2018-05-11 MED ORDER — ONDANSETRON HCL 4 MG/2ML IJ SOLN: 4.0000 mg | Freq: Four times a day (QID) | INTRAMUSCULAR | Status: DC | PRN

## 2018-05-11 MED ORDER — DILTIAZEM HCL 100 MG IV SOLR
5.0000 mg/h | Freq: Once | INTRAVENOUS | Status: AC
  Administered 2018-05-11: 5 mg/h via INTRAVENOUS
  Filled 2018-05-11: qty 100

## 2018-05-11 MED ORDER — POLYETHYLENE GLYCOL 3350 17 G PO PACK: 17.0000 g | PACK | Freq: Every day | ORAL | Status: DC | PRN

## 2018-05-11 MED ORDER — SERTRALINE HCL 50 MG PO TABS
50.0000 mg | ORAL_TABLET | Freq: Every day | ORAL | Status: DC
  Administered 2018-05-12 – 2018-05-13 (×2): 50 mg via ORAL
  Filled 2018-05-11 (×2): qty 1

## 2018-05-11 MED ORDER — SODIUM CHLORIDE 0.45 % IV SOLN
INTRAVENOUS | Status: DC
  Administered 2018-05-11 – 2018-05-12 (×2): via INTRAVENOUS

## 2018-05-11 MED ORDER — DILTIAZEM HCL-DEXTROSE 100-5 MG/100ML-% IV SOLN (PREMIX)
5.0000 mg/h | INTRAVENOUS | Status: DC
  Filled 2018-05-11 (×2): qty 100

## 2018-05-11 MED ORDER — DILTIAZEM HCL 60 MG PO TABS
ORAL_TABLET | ORAL | Status: AC
  Filled 2018-05-11: qty 2

## 2018-05-11 MED ORDER — ONDANSETRON HCL 4 MG PO TABS: 4.0000 mg | ORAL_TABLET | Freq: Four times a day (QID) | ORAL | Status: DC | PRN

## 2018-05-11 MED ORDER — DILTIAZEM HCL ER COATED BEADS 120 MG PO CP24
120.0000 mg | ORAL_CAPSULE | Freq: Every day | ORAL | Status: DC
  Administered 2018-05-11 – 2018-05-13 (×3): 120 mg via ORAL
  Filled 2018-05-11 (×3): qty 1

## 2018-05-11 MED ORDER — HYDROCODONE-ACETAMINOPHEN 5-325 MG PO TABS: 1.0000 | ORAL_TABLET | ORAL | Status: DC | PRN

## 2018-05-11 MED ORDER — ACETAMINOPHEN 650 MG RE SUPP: 650.0000 mg | Freq: Four times a day (QID) | RECTAL | Status: DC | PRN

## 2018-05-11 MED ORDER — HYDROCORTISONE 1 % EX CREA
1.0000 "application " | TOPICAL_CREAM | Freq: Three times a day (TID) | CUTANEOUS | Status: DC | PRN
  Filled 2018-05-11: qty 28

## 2018-05-11 MED ORDER — APIXABAN 5 MG PO TABS
5.0000 mg | ORAL_TABLET | Freq: Two times a day (BID) | ORAL | Status: DC
  Administered 2018-05-11 – 2018-05-13 (×3): 5 mg via ORAL
  Filled 2018-05-11 (×3): qty 1

## 2018-05-11 MED ORDER — ACETAMINOPHEN 325 MG PO TABS: 650.0000 mg | ORAL_TABLET | Freq: Four times a day (QID) | ORAL | Status: DC | PRN

## 2018-05-11 MED ORDER — SODIUM CHLORIDE 0.9 % IV BOLUS
1000.0000 mL | Freq: Once | INTRAVENOUS | Status: AC
  Administered 2018-05-11: 1000 mL via INTRAVENOUS

## 2018-05-11 MED ORDER — METOPROLOL SUCCINATE ER 50 MG PO TB24
50.0000 mg | ORAL_TABLET | Freq: Two times a day (BID) | ORAL | Status: DC
  Filled 2018-05-11: qty 1

## 2018-05-11 NOTE — ED Notes (Signed)
Christina RN aware of bed assigned

## 2018-05-11 NOTE — ED Provider Notes (Addendum)
[facility] Emergency Department Provider Note  ____________________________________________  Time seen: Approximately 11:20 AM  I have reviewed the triage vital signs and the nursing notes.   HISTORY  Chief Complaint Tachycardia    HPI Stephanie [name] is a 67 y.o. female with a history of A. fib on metoprolol and diltiazem presenting for palpitations.  The patient reports that for the past 10 days, she has been having intermittent episodes of palpitations with lightheadedness, chest pressure, and shortness of breath.  These can last from 1 to several hours.  She was seen last Tuesday, where her metoprolol dose was doubled and she was started on diltiazem but continues to have multiple daily episodes.  She was scheduled for cardioversion next week.  She continued to feel poorly, so today, she went for follow-up with her cardiologist, Dr. Nehemiah Massed, and while in the office had another episode with associated A. fib with RVR and was sent to the emergency department for further evaluation.  This time, the patient states she is still feeling mild palpitation but has significantly improved from when she was in the cardiology office.  Past Medical History:  Diagnosis Date  . A-fib ([place])   . Chronic diarrhea   . Diverticulosis   . Hypertension   . Restless leg     Patient Active Problem List   Diagnosis Date Noted  . Chronic diarrhea 01/24/2018  . Near syncope 11/16/2017  . Medicare annual wellness visit, subsequent 09/07/2017  . Screening for hyperlipidemia 09/07/2017  . Vitamin D deficiency 09/07/2017  . Personal history of tobacco use, presenting hazards to health 10/15/2016  . Chronic knee pain 06/08/2016  . Medicare annual wellness visit, initial 08/23/2015  . Restless leg syndrome 08/23/2015  . Essential hypertension 05/24/2015  . Anxiety and depression 05/24/2015  . Tobacco abuse 05/24/2015  . Atrial fibrillation ([place]) 05/24/2015    Past Surgical  History:  Procedure Laterality Date  . CHOLECYSTECTOMY  2004  . TOTAL KNEE ARTHROPLASTY Left 07/16/2016    Current Outpatient Rx  . Order #: [id number] Class: Normal  . Order #: [id number] Class: Historical Med  . Order #: [id number] Class: Historical Med  . Order #: [id number] Class: Historical Med  . Order #: [id number] Class: Historical Med  . Order #: [id number] Class: Normal  . Order #: [id number] Class: Normal  . Order #: [id number] Class: Normal  . Order #: [id number] Class: Normal    Allergies Codeine and Pyridium  [phenazopyridine hcl]  No family history on file.  Social History Social History   Tobacco Use  . Smoking status: Current Every Day Smoker    Packs/day: 1.00    Years: 40.00    Pack years: 40.00    Types: Cigarettes  . Smokeless tobacco: Never Used  Substance Use Topics  . Alcohol use: Yes    Alcohol/week: 0.0 standard drinks    Comment: social  . Drug use: No    Review of Systems Constitutional: No fever/chills.  Lightheadedness.  Positive presyncope.  Positive generalized fatigue. Eyes: No visual changes. ENT: No sore throat. No congestion or rhinorrhea. Cardiovascular: Positive chest pain.  Positive palpitations. Respiratory: Positive shortness of breath.  No cough. Gastrointestinal: No abdominal pain.  No nausea, no vomiting.  No diarrhea.  No constipation. Genitourinary: Negative for dysuria. Musculoskeletal: Negative for back pain.  No lower extremity swelling or calf pain Skin: Negative for rash. Neurological: Negative for headaches. No focal numbness, tingling or weakness.     ____________________________________________   PHYSICAL EXAM:  VITAL SIGNS: ED Triage Vitals  Enc Vitals  Group     BP 05/11/18 1043 110/65     Pulse Rate 05/11/18 1043 (!) 117     Resp 05/11/18 1043 20     Temp 05/11/18 1044 97.7 F (36.5 C)     Temp Source 05/11/18 1043 Oral     SpO2 05/11/18 1043 100 %     Weight 05/11/18 1044 173 lb (78.5 kg)     Height 05/11/18  1044 5\' 4"  (1.626 m)     Head Circumference --      Peak Flow --      Pain Score 05/11/18 1044 0     Pain Loc --      Pain Edu? --      Excl. in Early? --     Constitutional: Alert and oriented.  Answers questions appropriately. Eyes: Conjunctivae are normal.  EOMI. No scleral icterus. Head: Atraumatic. Nose: No congestion/rhinnorhea. Mouth/Throat: Mucous membranes are moist.  Neck: No stridor.  Supple.  No JVD. Cardiovascular: Fast rate, irregular rhythm. No murmurs, rubs or gallops.  Respiratory: Normal respiratory effort.  No accessory muscle use or retractions. Lungs CTAB.  No wheezes, rales or ronchi. Gastrointestinal: Soft, nontender and nondistended.  No guarding or rebound.  No peritoneal signs. Musculoskeletal: No LE edema. No ttp in the calves or palpable cords.  Negative Homan's sign. Neurologic:  A&Ox3.  Speech is clear.  Face and smile are symmetric.  EOMI.  Moves all extremities well. Skin:  Skin is warm, dry and intact. No rash noted. Psychiatric: Mood and affect are normal. Speech and behavior are normal.  Normal judgement  ____________________________________________   LABS (all labs ordered are listed, but only abnormal results are displayed)  Labs Reviewed  CBC  BASIC METABOLIC PANEL  TSH  TROPONIN I   ____________________________________________  EKG  ED ECG REPORT I, Eula Listen, the attending physician, personally viewed and interpreted this ECG.   Date: 05/11/2018  EKG Time: 959  Rate: 122  Rhythm: afib with RVR  Axis: normal  Intervals:none  ST&T Change: No STEMI; no ischemic changes.  Repeat EKG: ED ECG REPORT I, Eula Listen, the attending physician, personally viewed and interpreted this ECG.   Date: 05/11/2018  EKG Time: 1042  Rate: 111  Rhythm: sinus tachycardia  Axis: normal  Intervals:none  ST&T Change: No STEMI   ____________________________________________  RADIOLOGY  No results  found.  ____________________________________________   PROCEDURES  Procedure(s) performed: None  Procedures  Critical Care performed: Yes, see critical care note(s) ____________________________________________   INITIAL IMPRESSION / ASSESSMENT AND PLAN / ED COURSE  Pertinent labs & imaging results that were available during my care of the patient were reviewed by me and considered in my medical decision making (see chart for details).  67 y.o. female with a history of A. fib presenting with A. fib with RVR that has been symptomatic for the past 10 days.  Overall, the patient continues to be A. fib but is feeling overall better.  At this time, I will start her on a diltiazem drip, and the patient will be admitted for inpatient cardioversion.  Her laboratory studies are pending.  Additional considerations including ACS or MI, as well as thyroid dysfunction, electrolyte abnormalities, have been considered.  I have spoken with Dr. Clayborn Bigness, the cardiologist on-call for Dr. Nehemiah Massed, who agrees with the plan.  CRITICAL CARE Performed by: Eula Listen   Total critical care time: 35 minutes  Critical care time was exclusive of separately billable procedures and treating other patients.  Critical  care was necessary to treat or prevent imminent or life-threatening deterioration.  Critical care was time spent personally by me on the following activities: development of treatment plan with patient and/or surrogate as well as nursing, discussions with consultants, evaluation of patient's response to treatment, examination of patient, obtaining history from patient or surrogate, ordering and performing treatments and interventions, ordering and review of laboratory studies, ordering and review of radiographic studies, pulse oximetry and re-evaluation of patient's condition.   ____________________________________________  FINAL CLINICAL IMPRESSION(S) / ED DIAGNOSES  Final diagnoses:   Atrial fibrillation with RVR ([place])         NEW MEDICATIONS STARTED DURING THIS VISIT:  New Prescriptions   No medications on file      Eula Listen, MD 05/11/18 1127    Eula Listen, MD 05/11/18 1316

## 2018-05-11 NOTE — ED Notes (Signed)
Pt placed back on cardiac monitor after using toilet, given warm blanket and a pillow. Call light within reach.

## 2018-05-11 NOTE — ED Notes (Signed)
MD Salary notified that drip was discontinued due to patient's HR being 55 and BP 94/70

## 2018-05-11 NOTE — Consult Note (Signed)
Reason for Consult: Atrial fibrillation Referring Physician: Dr. Elta Guadeloupe Salary hospitalist, Alma Friendly primary Cardiologist Dr. Francene Castle Stephanie [name] is an 67 y.o. female.  HPI: Patient is a 67 year old white female who recently diagnosed with paroxysmal atrial fibrillation patient is been seen in the emergency room about a week ago then referred to Dr. Nehemiah Massed patient was treated with rate control metoprolol Cardizem placed on Eliquis.  Patient complains of not feeling well for tired and fatigue subsequently patient came to emergency room today after trying to be seen at the office with rapid atrial fibrillation.  Patient denies chest pain complains of mild shortness of breath.  Patient was placed on supplemental IV Cardizem and heparin in anticipation of TEE cardioversion  Past Medical History:  Diagnosis Date  . A-fib ([place])   . Chronic diarrhea   . Diverticulosis   . Hypertension   . Restless leg     Past Surgical History:  Procedure Laterality Date  . CHOLECYSTECTOMY  2004  . TOTAL KNEE ARTHROPLASTY Left 07/16/2016    No family history on file.  Social History:  reports that she has been smoking cigarettes. She has a 40.00 pack-year smoking history. She has never used smokeless tobacco. She reports that she drinks alcohol. She reports that she does not use drugs.  Allergies:  Allergies  Allergen Reactions  . Codeine Other (See Comments)    Cannot tolerate alone, only when mixed with another medication.  . Pyridium [Phenazopyridine Hcl] Hives    Medications: I have reviewed the patient's current medications.  Results for orders placed or performed during the hospital encounter of 05/11/18 (from the past 48 hour(s))  CBC     Status: None   Collection Time: 05/11/18 10:49 AM  Result Value Ref Range   WBC 5.9 3.6 - 11.0 K/uL   RBC 4.64 3.80 - 5.20 MIL/uL   Hemoglobin 15.0 12.0 - 16.0 g/dL   HCT 43.4 35.0 - 47.0 %   MCV 93.7 80.0 - 100.0 fL   MCH 32.3 26.0 - 34.0 pg    MCHC 34.5 32.0 - 36.0 g/dL   RDW 13.5 11.5 - 14.5 %   Platelets 221 150 - 440 K/uL    Comment: Performed at [facility], [address] [place], [place] [address]  Basic metabolic panel     Status: Abnormal   Collection Time: 05/11/18 10:49 AM  Result Value Ref Range   Sodium 142 135 - 145 mmol/L   Potassium 3.8 3.5 - 5.1 mmol/L   Chloride 109 98 - 111 mmol/L   CO2 23 22 - 32 mmol/L   Glucose, Bld 94 70 - 99 mg/dL   BUN 16 8 - 23 mg/dL   Creatinine, Ser 1.06 (H) 0.44 - 1.00 mg/dL   Calcium 9.2 8.9 - 10.3 mg/dL   GFR calc non Af Amer 52 (L) >60 mL/min   GFR calc Af Amer >60 >60 mL/min    Comment: (NOTE) The eGFR has been calculated using the CKD EPI equation. This calculation has not been validated in all clinical situations. eGFR's persistently <60 mL/min signify possible Chronic Kidney Disease.    Anion gap 10 5 - 15    Comment: Performed at [facility], [address] [place], [place] [address]  TSH     Status: None   Collection Time: 05/11/18 10:49 AM  Result Value Ref Range   TSH 0.880 0.350 - 4.500 uIU/mL    Comment: Performed by a 3rd Generation assay with a functional  sensitivity of <=0.01 uIU/mL. Performed at [facility], [address] [place], [place] [address]   Troponin I     Status: None   Collection Time: 05/11/18 10:49 AM  Result Value Ref Range   Troponin I <0.03 <0.03 ng/mL    Comment: Performed at [facility], [address] [place], [place] [address]    No results found.  Review of Systems  Constitutional: Positive for diaphoresis and malaise/fatigue.  HENT: Negative.   Eyes: Negative.   Respiratory: Positive for shortness of breath.   Cardiovascular: Positive for palpitations.  Gastrointestinal: Negative.   Genitourinary: Negative.   Musculoskeletal: Negative.   Skin: Negative.   Neurological: Negative.   Endo/Heme/Allergies: Negative.   Psychiatric/Behavioral: Negative.    Blood pressure  113/73, pulse 63, temperature 97.7 F (36.5 C), resp. rate 13, height '5\' 4"'$  (1.626 m), weight 78.5 kg, SpO2 96 %. Physical Exam  Nursing note and vitals reviewed. Constitutional: She is oriented to person, place, and time. She appears well-developed and well-nourished.  HENT:  Head: Normocephalic and atraumatic.  Eyes: Pupils are equal, round, and reactive to light. Conjunctivae and EOM are normal.  Neck: Normal range of motion. Neck supple.  Cardiovascular: Normal heart sounds and normal pulses. An irregularly irregular rhythm present. Tachycardia present.  Respiratory: Effort normal and breath sounds normal.  GI: Soft. Bowel sounds are normal.  Musculoskeletal: Normal range of motion.  Neurological: She is alert and oriented to person, place, and time. She has normal reflexes.  Skin: Skin is warm and dry.  Psychiatric: She has a normal mood and affect.    Assessment/Plan: Atrial fibrillation with ventricular response Hypertension Borderline obesity Generalized malaise . Plan Agree with anticoagulation Currently on Eliquis now on IV heparin Recommend rate control will Cardizem switch from IV to p.o. Recommend TEE cardioversion in the morning Consider high-level antiarrhythmic amiodarone or sotalol Echocardiogram may be helpful for LV function and evaluation of valvular structure  Didi Ganaway D Kellin Bartling 05/11/2018, 8:20 PM

## 2018-05-11 NOTE — ED Triage Notes (Signed)
Pt was sent from [facility] cardiology for rapid afib. States she was there for a follow up from being in the ED last Monday for a-fib, states she has not had issues for year. States she began having episodes of feeling lightheaded with diaphoresis and SOB over the past several months.

## 2018-05-11 NOTE — Progress Notes (Signed)
Family Meeting Note  Advance Directive:yes  Today a meeting took place with the Patient.  Patient is able to participate   The following clinical team members were present during this meeting:MD  The following were discussed:Patient's diagnosis: A. fib with RVR, Patient's progosis: Unable to determine and Goals for treatment: Full Code  Additional follow-up to be provided: prn  Time spent during discussion:20 minutes  Gorden Harms, MD

## 2018-05-11 NOTE — H&P (Signed)
[facility] NAME: Stephanie [name]    MR#:  [id number]  DATE OF BIRTH:  1951-08-13  DATE OF ADMISSION:  05/11/2018  PRIMARY CARE PHYSICIAN: Pleas Koch, NP   REQUESTING/REFERRING PHYSICIAN:   CHIEF COMPLAINT:   Chief Complaint  Patient presents with  . Tachycardia    HISTORY OF PRESENT ILLNESS: Stephanie [name]  is a 67 y.o. female with a known history per below which also includes atrial fibrillation, on Eliquis for approximately 1 week, was sent over from [place] clinic for A. fib with RVR, complaints of increased heart rate with shortness of breath, lightheadedness, ongoing for the last 10 days, intermittently over the last several months, on Tuesday patient was increased on her beta-blocker therapy and started on Cardizem p.o. with plans for cardioversion by Dr. Nehemiah Massed, patient started on Cardizem drip in the emergency room, patient currently in no apparent distress, resting comfortably in bed, patient now being admitted to the observation unit with plans for possible cardioversion, cardiology to see.  PAST MEDICAL HISTORY:   Past Medical History:  Diagnosis Date  . A-fib ([place])   . Chronic diarrhea   . Diverticulosis   . Hypertension   . Restless leg     PAST SURGICAL HISTORY:  Past Surgical History:  Procedure Laterality Date  . CHOLECYSTECTOMY  2004  . TOTAL KNEE ARTHROPLASTY Left 07/16/2016    SOCIAL HISTORY:  Social History   Tobacco Use  . Smoking status: Current Every Day Smoker    Packs/day: 1.00    Years: 40.00    Pack years: 40.00    Types: Cigarettes  . Smokeless tobacco: Never Used  Substance Use Topics  . Alcohol use: Yes    Alcohol/week: 0.0 standard drinks    Comment: social    FAMILY HISTORY: No family history on file.  DRUG ALLERGIES:  Allergies  Allergen Reactions  . Codeine Other (See Comments)    Cannot tolerate alone, only when mixed with another medication.  . Pyridium  [Phenazopyridine Hcl] Hives    REVIEW OF SYSTEMS:   CONSTITUTIONAL: No fever, fatigue or weakness.  EYES: No blurred or double vision.  EARS, NOSE, AND THROAT: No tinnitus or ear pain.  RESPIRATORY: No cough, shortness of breath, wheezing or hemoptysis.  CARDIOVASCULAR: No chest pain, orthopnea, edema.  GASTROINTESTINAL: No nausea, vomiting, diarrhea or abdominal pain.  GENITOURINARY: No dysuria, hematuria.  ENDOCRINE: No polyuria, nocturia,  HEMATOLOGY: No anemia, easy bruising or bleeding SKIN: No rash or lesion. MUSCULOSKELETAL: No joint pain or arthritis.   NEUROLOGIC: No tingling, numbness, weakness.  PSYCHIATRY: No anxiety or depression.   MEDICATIONS AT HOME:  Prior to Admission medications   Medication Sig Start Date End Date Taking? Authorizing Provider  calcium citrate-vitamin D (CITRACAL+D) 315-200 MG-UNIT per tablet Take 1 tablet by mouth daily.    Yes [provider]  cholecalciferol (VITAMIN D) 1000 units tablet Take 1,000 Units by mouth daily.    Yes [provider]  Cyanocobalamin (VITAMIN B-12 PO) Take 1,000 mcg by mouth daily.    Yes [provider]  diltiazem (CARDIZEM CD) 120 MG 24 hr capsule Take 1 capsule (120 mg total) by mouth daily. 05/02/18 05/02/19 Yes Earleen Newport, MD  ELIQUIS 5 MG TABS tablet Take 5 mg by mouth 2 (two) times daily. 05/03/18  Yes [provider]  metoprolol succinate (TOPROL-XL) 50 MG 24 hr tablet Take 50 mg by mouth 2 (two) times daily. 05/03/18  Yes [provider]  sertraline (ZOLOFT) 50 MG tablet Take 1 tablet (50 mg total) by mouth daily. 10/12/17  Yes Pleas Koch, NP      PHYSICAL EXAMINATION:   VITAL SIGNS: Blood pressure 134/75, pulse 78, temperature 97.7 F (36.5 C), resp. rate 18, height 5\' 4"  (1.626 m), weight 78.5 kg, SpO2 98 %.  GENERAL:  67 y.o.-year-old patient lying in the bed with no acute distress.  EYES: Pupils equal, round, reactive to light and accommodation.  No scleral icterus. Extraocular muscles intact.  HEENT: Head atraumatic, normocephalic. Oropharynx and nasopharynx clear.  NECK:  Supple, no jugular venous distention. No thyroid enlargement, no tenderness.  LUNGS: Normal breath sounds bilaterally, no wheezing, rales,rhonchi or crepitation. No use of accessory muscles of respiration.  CARDIOVASCULAR: Irregular rate and rhythm S1, S2 normal. No murmurs, rubs, or gallops.  ABDOMEN: Soft, nontender, nondistended. Bowel sounds present. No organomegaly or mass.  EXTREMITIES: No pedal edema, cyanosis, or clubbing.  NEUROLOGIC: Cranial nerves II through XII are intact. Muscle strength 5/5 in all extremities. Sensation intact. Gait not checked.  PSYCHIATRIC: The patient is alert and oriented x 3.  SKIN: No obvious rash, lesion, or ulcer.   LABORATORY PANEL:   CBC Recent Labs  Lab 05/11/18 1049  WBC 5.9  HGB 15.0  HCT 43.4  PLT 221  MCV 93.7  MCH 32.3  MCHC 34.5  RDW 13.5   ------------------------------------------------------------------------------------------------------------------  Chemistries  Recent Labs  Lab 05/11/18 1049  NA 142  K 3.8  CL 109  CO2 23  GLUCOSE 94  BUN 16  CREATININE 1.06*  CALCIUM 9.2   ------------------------------------------------------------------------------------------------------------------ estimated creatinine clearance is 50.8 mL/min (A) (by C-G formula based on SCr of 1.06 mg/dL (H)). ------------------------------------------------------------------------------------------------------------------ Recent Labs    05/11/18 1049  TSH 0.880     Coagulation profile No results for input(s): INR, PROTIME in the last 168 hours. ------------------------------------------------------------------------------------------------------------------- No results for input(s): DDIMER in the last 72  hours. -------------------------------------------------------------------------------------------------------------------  Cardiac Enzymes Recent Labs  Lab 05/11/18 1049  TROPONINI <0.03   ------------------------------------------------------------------------------------------------------------------ Invalid input(s): POCBNP  ---------------------------------------------------------------------------------------------------------------  Urinalysis No results found for: COLORURINE, APPEARANCEUR, LABSPEC, PHURINE, GLUCOSEU, HGBUR, BILIRUBINUR, KETONESUR, PROTEINUR, UROBILINOGEN, NITRITE, LEUKOCYTESUR   RADIOLOGY: No results found.  EKG: Orders placed or performed in visit on 05/11/18  . EKG 12-Lead    IMPRESSION AND PLAN: *Acute on chronic A. fib with RVR *Hypertension *Hyperlipidemia *Chronic tobacco smoking abuse/dependency  Admit to the observation unit, cycle cardiac enzymes, continue Cardizem drip with weaning as tolerated, continue Toprol-XL, Cardizem CD 120 mg daily, supplemental oxygen weaning as tolerated, nitrates as needed, IV morphine for chest pain, check lipids in the morning, tobacco cessation counseling ordered, nicotine patch, case discussed with cardiology/Dr. Clayborn Bigness for possible cardioversion later today or tomorrow, n.p.o. for now, and continue close medical monitoring Noted echocardiogram from earlier this year with normal ejection fraction/systolic function   All the records are reviewed and case discussed with ED provider. Management plans discussed with the patient, family and they are in agreement.  CODE STATUS:full    TOTAL TIME TAKING CARE OF THIS PATIENT: 45 minutes.    Avel Peace Eyana Stolze M.D on 05/11/2018   Between 7am to 6pm - Pager - [phone]  After 6pm go to [email or web address] - password [email or web address] [facility] [facility]  Office  [phone]  CC: Primary care physician; Pleas Koch, NP   Note: This dictation was  prepared with Dragon dictation along with smaller phrase technology. Any transcriptional errors that result from this  process are unintentional.

## 2018-05-11 NOTE — ED Notes (Signed)
First Nurse Note: Patient to ED from [facility] via [facility], sent over for AFIB with rapid HR, SHOB, and light headedness.  Patient alert and oriented.  AP - 106 and irregular, RR - 32.

## 2018-05-11 NOTE — ED Notes (Signed)
Patient to Rm 9 via WC, Mickel Baas RN aware.  Stephaine RN in room.

## 2018-05-11 NOTE — Plan of Care (Signed)
  Problem: Education: Goal: Knowledge of General Education information will improve Description: Including pain rating scale, medication(s)/side effects and non-pharmacologic comfort measures Outcome: Progressing   Problem: Health Behavior/Discharge Planning: Goal: Ability to manage health-related needs will improve Outcome: Progressing   Problem: Clinical Measurements: Goal: Ability to maintain clinical measurements within normal limits will improve Outcome: Progressing Goal: Will remain free from infection Outcome: Progressing Goal: Diagnostic test results will improve Outcome: Progressing Goal: Respiratory complications will improve Outcome: Progressing Goal: Cardiovascular complication will be avoided Outcome: Progressing   Problem: Activity: Goal: Risk for activity intolerance will decrease Outcome: Progressing   Problem: Nutrition: Goal: Adequate nutrition will be maintained Outcome: Progressing   Problem: Coping: Goal: Level of anxiety will decrease Outcome: Progressing   Problem: Elimination: Goal: Will not experience complications related to bowel motility Outcome: Progressing Goal: Will not experience complications related to urinary retention Outcome: Progressing   Problem: Pain Managment: Goal: General experience of comfort will improve Outcome: Progressing   Problem: Safety: Goal: Ability to remain free from injury will improve Outcome: Progressing   Problem: Skin Integrity: Goal: Risk for impaired skin integrity will decrease Outcome: Progressing   Problem: Cardiac: Goal: Ability to achieve and maintain adequate cardiopulmonary perfusion will improve Outcome: Progressing   Problem: Health Behavior/Discharge Planning: Goal: Ability to safely manage health-related needs after discharge will improve Outcome: Progressing   

## 2018-05-12 ENCOUNTER — Observation Stay
Admit: 2018-05-12 | Discharge: 2018-05-12 | Disposition: A | Discharge status: Home / Self Care | Payer: Medicare HMO | Attending: Internal Medicine | Admitting: Internal Medicine

## 2018-05-12 ENCOUNTER — Encounter
Admission: EM | Disposition: A | Discharge status: Home / Self Care | Payer: Self-pay | Source: Home / Self Care | Attending: Emergency Medicine

## 2018-05-12 ENCOUNTER — Observation Stay: Payer: Medicare HMO | Admitting: Anesthesiology

## 2018-05-12 ENCOUNTER — Encounter: Payer: Self-pay | Admitting: *Deleted

## 2018-05-12 HISTORY — PX: CARDIOVERSION: EP1203

## 2018-05-12 HISTORY — PX: TEE WITHOUT CARDIOVERSION: SHX5443

## 2018-05-12 LAB — TROPONIN I: Troponin I: <0.03 ng/mL (ref <0.03)

## 2018-05-12 SURGERY — ECHOCARDIOGRAM, TRANSESOPHAGEAL
Anesthesia: General

## 2018-05-12 MED ORDER — SUCCINYLCHOLINE CHLORIDE 20 MG/ML IJ SOLN
INTRAMUSCULAR | Status: AC
  Filled 2018-05-12: qty 1

## 2018-05-12 MED ORDER — ROCURONIUM BROMIDE 50 MG/5ML IV SOLN
INTRAVENOUS | Status: AC
  Filled 2018-05-12: qty 1

## 2018-05-12 MED ORDER — SODIUM CHLORIDE 0.9 % IV SOLN: INTRAVENOUS | Status: DC

## 2018-05-12 MED ORDER — PROPOFOL 10 MG/ML IV BOLUS
INTRAVENOUS | Status: AC
  Filled 2018-05-12: qty 20

## 2018-05-12 MED ORDER — BUTAMBEN-TETRACAINE-BENZOCAINE 2-2-14 % EX AERO
INHALATION_SPRAY | CUTANEOUS | Status: AC
  Filled 2018-05-12: qty 5

## 2018-05-12 MED ORDER — FENTANYL CITRATE (PF) 100 MCG/2ML IJ SOLN
INTRAMUSCULAR | Status: AC
  Filled 2018-05-12: qty 2

## 2018-05-12 MED ORDER — SODIUM CHLORIDE FLUSH 0.9 % IV SOLN
INTRAVENOUS | Status: AC
  Filled 2018-05-12: qty 10

## 2018-05-12 MED ORDER — PROPOFOL 10 MG/ML IV BOLUS
INTRAVENOUS | Status: DC | PRN
  Administered 2018-05-12: 30 mg via INTRAVENOUS
  Administered 2018-05-12: 50 mg via INTRAVENOUS

## 2018-05-12 MED ORDER — MIDAZOLAM HCL 2 MG/2ML IJ SOLN
INTRAMUSCULAR | Status: DC | PRN
  Administered 2018-05-12: 2 mg via INTRAVENOUS

## 2018-05-12 MED ORDER — MIDAZOLAM HCL 2 MG/2ML IJ SOLN
INTRAMUSCULAR | Status: AC
  Filled 2018-05-12: qty 2

## 2018-05-12 MED ORDER — PREMIER PROTEIN SHAKE
11.0000 [oz_av] | Freq: Two times a day (BID) | ORAL | Status: DC
  Administered 2018-05-12 – 2018-05-13 (×2): 11 [oz_av] via ORAL

## 2018-05-12 MED ORDER — LIDOCAINE VISCOUS HCL 2 % MT SOLN
OROMUCOSAL | Status: AC
  Administered 2018-05-12: 15 mL
  Filled 2018-05-12: qty 15

## 2018-05-12 NOTE — Anesthesia Postprocedure Evaluation (Signed)
Anesthesia Post Note  Patient: Stephanie [name]  Procedure(s) Performed: TRANSESOPHAGEAL ECHOCARDIOGRAM (TEE) (N/A ) CARDIOVERSION (N/A )  Patient location during evaluation: Cath Lab Anesthesia Type: General Level of consciousness: awake and alert Pain management: pain level controlled Vital Signs Assessment: post-procedure vital signs reviewed and stable Respiratory status: spontaneous breathing, nonlabored ventilation, respiratory function stable and patient connected to nasal cannula oxygen Cardiovascular status: blood pressure returned to baseline and stable Postop Assessment: no apparent nausea or vomiting Anesthetic complications: no     Last Vitals:  Vitals:   05/12/18 1300 05/12/18 1315  BP: 122/60 113/60  Pulse: 68 (!) 58  Resp: 14 12  Temp:    SpO2: 100% 100%    Last Pain:  Vitals:   05/12/18 1315  TempSrc:   PainSc: 0-No pain                 Precious Haws Piscitello

## 2018-05-12 NOTE — Plan of Care (Signed)
  Problem: Health Behavior/Discharge Planning: Goal: Ability to manage health-related needs will improve Outcome: Progressing Note:  Patient's in recovery from a TEE and cardioversion earlier today. ECHO has already revealed a normal E.F. Patient is also now in NSR (previously in A-Fib), per report. Will continue to monitor. Wenda Low [facility]

## 2018-05-12 NOTE — Anesthesia Post-op Follow-up Note (Signed)
Anesthesia QCDR form completed.        

## 2018-05-12 NOTE — Progress Notes (Signed)
Patient was transferred from the ER following an episode of afib/RVR . On admission patient was in afib in the 60s to 80s. She was A&O X4, ambulatory and denied having any chest pain. Patient was oriented to her room, care plan and meds reviewed. Patient was kept NPO overnight per order for a possible TEE in AM.

## 2018-05-12 NOTE — Transfer of Care (Signed)
Immediate Anesthesia Transfer of Care Note  Patient: Stephanie [name]  Procedure(s) Performed: TRANSESOPHAGEAL ECHOCARDIOGRAM (TEE) (N/A ) CARDIOVERSION (N/A )  Patient Location: PACU and Radiology  Anesthesia Type:General  Level of Consciousness: awake, alert  and patient cooperative  Airway & Oxygen Therapy: Patient Spontanous Breathing and Patient connected to nasal cannula oxygen  Post-op Assessment: Report given to RN and Post -op Vital signs reviewed and stable  Post vital signs: Reviewed and stable  Last Vitals:  Vitals Value Taken Time  BP 109/63 05/12/2018 12:42 PM  Temp    Pulse 71 05/12/2018 12:46 PM  Resp 27 05/12/2018 12:46 PM  SpO2 99 % 05/12/2018 12:46 PM    Last Pain:  Vitals:   05/12/18 1140  TempSrc: Oral  PainSc: 1       Patients Stated Pain Goal: 0 (13/64/38 3779)  Complications: No apparent anesthesia complications

## 2018-05-12 NOTE — Progress Notes (Addendum)
[place] [facility] [place] NAME: Stephanie [name]    MR#:  [id number]  DATE OF BIRTH:  1951-05-08  SUBJECTIVE: Status post cardioversion today, now in sinus rhythm.  Admitted for shortness of breath, dizziness, generalized weakness.  CHIEF COMPLAINT:   Chief Complaint  Patient presents with  . Tachycardia    REVIEW OF SYSTEMS:   ROS CONSTITUTIONAL: No fever, fatigue or weakness.  Patient feels much better after cardioversion today. EYES: No blurred or double vision.  EARS, NOSE, AND THROAT: No tinnitus or ear pain.  RESPIRATORY: No cough, shortness of breath, wheezing or hemoptysis.  CARDIOVASCULAR: No chest pain, orthopnea, edema.  GASTROINTESTINAL: No nausea, vomiting, diarrhea or abdominal pain.  GENITOURINARY: No dysuria, hematuria.  ENDOCRINE: No polyuria, nocturia,  HEMATOLOGY: No anemia, easy bruising or bleeding SKIN: No rash or lesion. MUSCULOSKELETAL: No joint pain or arthritis.   NEUROLOGIC: No tingling, numbness, weakness.  PSYCHIATRY: No anxiety or depression.   DRUG ALLERGIES:   Allergies  Allergen Reactions  . Codeine Other (See Comments)    Cannot tolerate alone, only when mixed with another medication.  . Lactose Intolerance (Gi)   . Pyridium [Phenazopyridine Hcl] Hives    VITALS:  Blood pressure 115/64, pulse 66, temperature 97.8 F (36.6 C), temperature source Oral, resp. rate 12, height 5\' 4"  (1.626 m), weight 79.8 kg, SpO2 99 %.  PHYSICAL EXAMINATION:  GENERAL:  67 y.o.-year-old patient lying in the bed with no acute distress.  EYES: Pupils equal, round, reactive to light and accommodation. No scleral icterus. Extraocular muscles intact.  HEENT: Head atraumatic, normocephalic. Oropharynx and nasopharynx clear.  NECK:  Supple, no jugular venous distention. No thyroid enlargement, no tenderness.  LUNGS: Normal breath sounds bilaterally, no wheezing, rales,rhonchi or crepitation. No use of accessory muscles  of respiration.  CARDIOVASCULAR: S1, S2 normal. No murmurs, rubs, or gallops.  ABDOMEN: Soft, nontender, nondistended. Bowel sounds present. No organomegaly or mass.  EXTREMITIES: No pedal edema, cyanosis, or clubbing.  NEUROLOGIC: Cranial nerves II through XII are intact. Muscle strength 5/5 in all extremities. Sensation intact. Gait not checked.  PSYCHIATRIC: The patient is alert and oriented x 3.  SKIN: No obvious rash, lesion, or ulcer.    LABORATORY PANEL:   CBC Recent Labs  Lab 05/11/18 1049  WBC 5.9  HGB 15.0  HCT 43.4  PLT 221   ------------------------------------------------------------------------------------------------------------------  Chemistries  Recent Labs  Lab 05/11/18 1049  NA 142  K 3.8  CL 109  CO2 23  GLUCOSE 94  BUN 16  CREATININE 1.06*  CALCIUM 9.2   ------------------------------------------------------------------------------------------------------------------  Cardiac Enzymes Recent Labs  Lab 05/12/18 0900  TROPONINI <0.03   ------------------------------------------------------------------------------------------------------------------  RADIOLOGY:  No results found.  Afebrile, EKG:   Orders placed or performed during the hospital encounter of 05/11/18  . EKG 12-Lead pre-cardioversion  . EKG 12-Lead pre-cardioversion  . EKG    ASSESSMENT AND PLAN:  , #1 atrial fibrillation with RVR, status post cardioversion, TEE did not show any atrial thrombus.  Cardioversion, patient now in sinus rhythm, continue Eliquis anticoagulation, continue Cardizem. Depression: Continue Zoloft. GI, DVT prophylaxis, Time spent 33 minutes   Epifanio Lesches M.D on 05/12/2018 at 5:36 PM  Between 7am to 6pm - Pager - [phone]  After 6pm go to [email or web address] - password [email or web address]  [facility]  Office  [phone]  CC: Primary care physician; Pleas Koch, NP   Note: This dictation was prepared with Dragon  dictation along with smaller  Company secretary. Any transcriptional errors that result from this process are unintentional.

## 2018-05-12 NOTE — Procedures (Signed)
Transesophageal echocardiogram preliminary report  Stephanie [name] [id number] 11-06-1950  Preliminary diagnosis  Atrial fibrillation with or without cardioversion  Postprocedural diagnosis  Atrial fibrillation without left atrial appendage thrombus  Time out A timeout was performed by the nursing staff and physicians specifically identifying the procedure performed, identification of the patient, the type of sedation, all allergies and medications, all pertinent medical history, and presedation assessment of nasopharynx. The patient and or family understand the risks of the procedure including the rare risks of death, stroke, heart attack, esophogeal perforation, sore throat, and reaction to medications given.  Moderate sedation During this procedure the patient has received Versed 2 milligrams and fentanyl 0 micrograms to achieve appropriate moderate sedation.  The patient had continued monitoring of heart rate, oxygenation, blood pressure, respiratory rate, and extent of signs of sedation throughout the entire procedure.  The patient received this moderate sedation over a period of 15 minutes.  Both the nursing staff and I were present during the procedure when the patient had moderate sedation for 100% of the time.  Treatment considerations  Electrical cardioversion of atrial fibrillation due to no evidence of atrial appendage thrombus  For further details of transesophageal echocardiogram please refer to final report.  Signed,  Corey Skains M.D. [facility] 05/12/2018 1:11 PM

## 2018-05-12 NOTE — Anesthesia Preprocedure Evaluation (Signed)
Anesthesia Evaluation  Patient identified by MRN, date of birth, ID band Patient awake    Reviewed: Allergy & Precautions, H&P , NPO status , Patient's Chart, lab work & pertinent test results  History of Anesthesia Complications Negative for: history of anesthetic complications  Airway Mallampati: III  TM Distance: <3 FB Neck ROM: limited    Dental  (+) Chipped, Poor Dentition, Caps   Pulmonary neg shortness of breath, COPD, Current Smoker,           Cardiovascular Exercise Tolerance: Good hypertension, (-) angina(-) Past MI + dysrhythmias Atrial Fibrillation      Neuro/Psych PSYCHIATRIC DISORDERS Anxiety negative neurological ROS     GI/Hepatic negative GI ROS, Neg liver ROS, neg GERD  ,  Endo/Other  negative endocrine ROS  Renal/GU negative Renal ROS  negative genitourinary   Musculoskeletal   Abdominal   Peds  Hematology negative hematology ROS (+)   Anesthesia Other Findings Past Medical History: No date: A-fib (HCC) No date: Chronic diarrhea No date: Diverticulosis No date: Hypertension No date: Restless leg  Past Surgical History: 2004: CHOLECYSTECTOMY No date: KNEE SURGERY  BMI    Body Mass Index:  30.21 kg/m      Reproductive/Obstetrics negative OB ROS                             Anesthesia Physical Anesthesia Plan  ASA: IV  Anesthesia Plan: General   Post-op Pain Management:    Induction: Intravenous  PONV Risk Score and Plan: Propofol infusion and TIVA  Airway Management Planned: Natural Airway and Nasal Cannula  Additional Equipment:   Intra-op Plan:   Post-operative Plan:   Informed Consent: I have reviewed the patients History and Physical, chart, labs and discussed the procedure including the risks, benefits and alternatives for the proposed anesthesia with the patient or authorized representative who has indicated his/her understanding and  acceptance.   Dental Advisory Given  Plan Discussed with: Anesthesiologist, CRNA and Surgeon  Anesthesia Plan Comments: (Patient consented for risks of anesthesia including but not limited to:  - adverse reactions to medications - risk of intubation if required - damage to teeth, lips or other oral mucosa - sore throat or hoarseness - Damage to heart, brain, lungs or loss of life  Patient voiced understanding.)        Anesthesia Quick Evaluation

## 2018-05-12 NOTE — Progress Notes (Signed)
Initial Nutrition Assessment  DOCUMENTATION CODES:   Not applicable  INTERVENTION:   Premier Protein BID, each supplement provides 160 kcal and 30 grams of protein.   NUTRITION DIAGNOSIS:   Inadequate oral intake related to acute illness as evidenced by per patient/family report.  GOAL:   Patient will meet greater than or equal to 90% of their needs  MONITOR:   PO intake, Supplement acceptance, Labs, Weight trends, I & O's, Skin  REASON FOR ASSESSMENT:   Malnutrition Screening Tool    ASSESSMENT:   67 y.o. female with a known history per below which also includes atrial fibrillation, on Eliquis for approximately 1 week, was sent over from [facility] for A. fib with RVR, complaints of increased heart rate with shortness of breath, lightheadedness, ongoing for the last 10 days, intermittently over the last several months   Unable to see to pt today; pt in procedure at time of RD visit. Pt NPO today for TEE. Per chart, pt with 15% wt loss over the past year; this is not significant. RD will add supplements to help pt meet her estimated protein needs. Will obtain nutrition related history and NFPE at follow up. Pt noted to be on a low dairy diet; will change pt to lactose intolerant until able to speak with pt.   Medications reviewed and include: NaCl @50ml /hr  Labs reviewed:   Unable to complete Nutrition-Focused physical exam at this time.   Diet Order:   Diet Order            Diet Heart Room service appropriate? Yes; Fluid consistency: Thin  Diet effective now             EDUCATION NEEDS:   No education needs have been identified at this time  Skin:  Skin Assessment: Reviewed RN Assessment  Last BM:  8/21  Height:   Ht Readings from Last 1 Encounters:  05/12/18 5\' 4"  (1.626 m)    Weight:   Wt Readings from Last 1 Encounters:  05/11/18 79.8 kg    Ideal Body Weight:  54.5 kg  BMI:  Body mass index is 30.21 kg/m.  Estimated Nutritional Needs:    Kcal:  1600-1800kcal/day   Protein:  80-88g/day   Fluid:  >1.6L/day   Koleen Distance MS, RD, LDN Pager #- [phone] Office#- [phone] After Hours Pager: [phone]

## 2018-05-12 NOTE — CV Procedure (Signed)
Electrical Cardioversion Procedure Note Stephanie [name] [id number] 1951/09/16  Procedure: Electrical Cardioversion Indications:  Atrial Fibrillation  Procedure Details Consent: Risks of procedure as well as the alternatives and risks of each were explained to the (patient/caregiver).  Consent for procedure obtained. Time Out: Verified patient identification, verified procedure, site/side was marked, verified correct patient position, special equipment/implants available, medications/allergies/relevent history reviewed, required imaging and test results available.  Performed  Patient placed on cardiac monitor, pulse oximetry, supplemental oxygen as necessary.  Sedation given: Benzodiazepines and Short-acting barbiturates Pacer pads placed anterior and posterior chest.  Cardioverted 1 time(s).  Cardioverted at 120J.  Evaluation Findings: Post procedure EKG shows: NSR Complications: None Patient did tolerate procedure well.   Stephanie [name] 05/12/2018, 1:15 PM

## 2018-05-12 NOTE — Progress Notes (Signed)
[facility] Encounter Note  Patient: Rio Taber / Admit Date: 05/11/2018 / Date of Encounter: 05/12/2018, 1:12 PM   Subjective: Vision admitted to the hospital with continued weakness fatigue irregular heartbeat atrial fibrillation and side effects of medication management needing electrical cardioversion to normal sinus rhythm. Transesophageal echocardiogram showed no evidence of thrombus of the atrial appendage with continued appropriate medication management Electrical cardioversion of atrial fibrillation to normal sinus rhythm success without complication  Review of Systems: Positive for: Redness of breath weakness fatigue Negative for: Vision change, hearing change, syncope, dizziness, nausea, vomiting,diarrhea, bloody stool, stomach pain, cough, congestion, diaphoresis, urinary frequency, urinary pain,skin lesions, skin rashes Others previously listed  Objective: Telemetry: Normal sinus rhythm Physical Exam: Blood pressure 122/60, pulse 68, temperature 97.7 F (36.5 C), temperature source Oral, resp. rate 14, height 5\' 4"  (1.626 m), weight 79.8 kg, SpO2 100 %. Body mass index is 30.21 kg/m. General: Well developed, well nourished, in no acute distress. Head: Normocephalic, atraumatic, sclera non-icteric, no xanthomas, nares are without discharge. Neck: No apparent masses Lungs: Normal respirations with no wheezes, no rhonchi, no rales , no crackles   Heart: Regular rate and rhythm, normal S1 S2, no murmur, no rub, no gallop, PMI is normal size and placement, carotid upstroke normal without bruit, jugular venous pressure normal Abdomen: Soft, non-tender, non-distended with normoactive bowel sounds. No hepatosplenomegaly. Abdominal aorta is normal size without bruit Extremities: No edema, no clubbing, no cyanosis, no ulcers,  Peripheral: 2+ radial, 2+ femoral, 2+ dorsal pedal pulses Neuro: Alert and oriented. Moves all extremities spontaneously. Psych:   Responds to questions appropriately with a normal affect.   Intake/Output Summary (Last 24 hours) at 05/12/2018 1312 Last data filed at 05/12/2018 1246 Gross per 24 hour  Intake 1234.84 ml  Output 1200 ml  Net 34.84 ml    Inpatient Medications:  . [MAR Hold] apixaban  5 mg Oral BID  . butamben-tetracaine-benzocaine      . [MAR Hold] diltiazem  120 mg Oral Daily  . [MAR Hold] sertraline  50 mg Oral Daily  . sodium chloride flush       Infusions:  . sodium chloride 50 mL/hr at 05/12/18 0400  . sodium chloride      Labs: Recent Labs    05/11/18 1049  NA 142  K 3.8  CL 109  CO2 23  GLUCOSE 94  BUN 16  CREATININE 1.06*  CALCIUM 9.2   No results for input(s): AST, ALT, ALKPHOS, BILITOT, PROT, ALBUMIN in the last 72 hours. Recent Labs    05/11/18 1049  WBC 5.9  HGB 15.0  HCT 43.4  MCV 93.7  PLT 221   Recent Labs    05/11/18 1049 05/11/18 2141 05/12/18 0333 05/12/18 0900  TROPONINI <0.03 <0.03 <0.03 <0.03   Invalid input(s): POCBNP No results for input(s): HGBA1C in the last 72 hours.   Weights: Filed Weights   05/11/18 1044 05/11/18 2033  Weight: 78.5 kg 79.8 kg     Radiology/Studies:  Dg Chest 1 View  Result Date: 05/02/2018 CLINICAL DATA:  Tachycardia and chest tightness EXAM: CHEST  1 VIEW COMPARISON:  CT 10/27/2017, radiograph 01/22/2017 FINDINGS: No acute opacity or effusion. Heart size within normal limits for portable technique. No pneumothorax. IMPRESSION: No active disease. Electronically Signed   By: Donavan Foil M.D.   On: 05/02/2018 17:25     Assessment and Recommendation  67 y.o. female 67 year old female with atrial fibrillation with rapid ventricular rate and significant symptoms on appropriate medication  management now without evidence of thrombus of atrial appendage and normal LV systolic function status post electrical cardioversion to normal sinus rhythm without complication 1.  Continue medication management for heart rate control  and maintenance of normal sinus rhythm with using diltiazem.  Possible discontinuation of metoprolol if necessary 2.  Continue anticoagulation without change 3.  In ambulation and adjustments of medication management 4.  Okay for discharge home if ambulating well with follow-up next week  Signed, Serafina Royals M.D. FACC

## 2018-05-12 NOTE — Progress Notes (Signed)
*  PRELIMINARY RESULTS* Echocardiogram Echocardiogram Transesophageal has been performed.  Stephanie [name] 05/12/2018, 12:55 PM

## 2018-05-12 NOTE — Care Management Obs Status (Signed)
[place] NOTIFICATION   Patient Details  Name: Stephanie [name] MRN: [id number] Date of Birth: 1951/08/10   Medicare Observation Status Notification Given:  Yes    Guliana Weyandt A Denny Lave, RN 05/12/2018, 3:20 PM

## 2018-05-13 ENCOUNTER — Encounter: Payer: Self-pay | Admitting: Internal Medicine

## 2018-05-13 LAB — HIV ANTIBODY (ROUTINE TESTING W REFLEX): HIV Screen 4th Generation wRfx: NONREACTIVE

## 2018-05-13 MED ORDER — PREMIER PROTEIN SHAKE: 11.0000 [oz_av] | Freq: Two times a day (BID) | ORAL | 0 refills | Status: DC

## 2018-05-13 NOTE — Discharge Summary (Signed)
Stephanie [name], is a 67 y.o. female  DOB Oct 31, 1950  MRN [id number].  Admission date:  05/11/2018  Admitting Physician  Stephanie Harms, MD  Discharge Date:  05/13/2018   Primary MD  Stephanie Koch, NP  Recommendations for primary care physician for things to follow:   PCP in  1 week Follow-up with Dr. Nehemiah [name] in 2 weeks   Admission Diagnosis  Atrial fibrillation with RVR [facility]) [I48.91]   Discharge Diagnosis  Atrial fibrillation with RVR ([place]) [I48.91]    Active Problems:   A-fib [facility])      Past Medical History:  Diagnosis Date  . A-fib ([place])   . Chronic diarrhea   . Diverticulosis   . Hypertension   . Restless leg     Past Surgical History:  Procedure Laterality Date  . CHOLECYSTECTOMY  2004  . KNEE SURGERY         History of present illness and  Hospital Course:     Kindly see H&P for history of present illness and admission details, please review complete Labs, Consult reports and Test reports for all details in brief  HPI  from the history and physical done on the day of admission 67 year old female patient came in because of generalized weakness, fatigue.  Patient recently diagnosed with paroxysmal atrial fibrillation.  Patient started on Cardizem, metoprolol, Eliquis by cardiology   Hospital Course  #1 A. fib with RVR, patient received IV heparin, patient initially started on Cardizem IV in the emergency room but changed to Cardizem tablets, admitted to telemetry, patient seen by cardiology Dr. Nehemiah [name] had the end cardioversion yesterday, after cardioversion patient remained in sinus rhythm with heart rate around 78.  Hemodynamically stable with normal blood pressure.  Spoke with Dr. Nehemiah [name] over the phone recommended to continue Eliquis, metoprolol, Cardizem at home dose, he will see the  patient in 2 weeks.  Spoke with patient granddaughter Ms. Stephanie [name] who is involved patient in patient's care. Will be on Toprol-XL 50 mg twice daily, Cardizem 3120 mg p.o. daily.,  Eliquis 5 mg p.o. twice daily    Discharge Condition: stable   Follow UP  Follow-up Information    Stephanie Koch, NP. Schedule an appointment as soon as possible for a visit on 05/25/2018.   Specialty:  Internal Medicine Why:  Appointment Time: @ 11:00 am Contact information: [address] [place] [address] [phone]        Stephanie Skains, MD. Schedule an appointment as soon as possible for a visit on 05/17/2018.   Specialty:  Cardiology Why:  Appointment Time: 9:15 Contact information: [facility] [place] [address] [phone]             Discharge Instructions  and  Discharge Medications      Allergies as of 05/13/2018      Reactions   Codeine Other (See Comments)   Cannot tolerate alone, only when mixed with another medication.   Lactose Intolerance (gi)    Pyridium [phenazopyridine Hcl] Hives      Medication List    TAKE these medications   calcium citrate-vitamin D 315-200 MG-UNIT tablet Commonly known as:  CITRACAL+D Take 1 tablet by mouth daily.   cholecalciferol 1000 units tablet Commonly known as:  VITAMIN D Take 1,000 Units by mouth daily.   diltiazem 120 MG 24 hr capsule Commonly known as:  CARDIZEM CD Take 1 capsule (120 mg total) by mouth daily.   ELIQUIS 5  MG Tabs tablet Generic drug:  apixaban Take 5 mg by mouth 2 (two) times daily.   metoprolol succinate 50 MG 24 hr tablet Commonly known as:  TOPROL-XL Take 50 mg by mouth 2 (two) times daily.   protein supplement shake Liqd Commonly known as:  PREMIER PROTEIN Take 325 mLs (11 oz total) by mouth 2 (two) times daily between meals.   sertraline 50 MG tablet Commonly known as:  ZOLOFT Take 1 tablet (50 mg total) by mouth daily.   VITAMIN B-12  PO Take 1,000 mcg by mouth daily.         Diet and Activity recommendation: See Discharge Instructions above   Consults obtained - cardiology   Major procedures and Radiology Reports - PLEASE review detailed and final reports for all details, in brief -      Dg Chest 1 View  Result Date: 05/02/2018 CLINICAL DATA:  Tachycardia and chest tightness EXAM: CHEST  1 VIEW COMPARISON:  CT 10/27/2017, radiograph 01/22/2017 FINDINGS: No acute opacity or effusion. Heart size within normal limits for portable technique. No pneumothorax. IMPRESSION: No active disease. Electronically Signed   By: Donavan Foil M.D.   On: 05/02/2018 17:25    Micro Results     No results found for this or any previous visit (from the past 240 hour(s)).     Today   Subjective:   Stephanie [name] today has no headache,no chest abdominal pain,no new weakness tingling or numbness, feels much better wants to go home today.   Objective:   Blood pressure 133/60, pulse 65, temperature 97.6 F (36.4 C), temperature source Oral, resp. rate 20, height 5\' 4"  (1.626 m), weight 81.4 kg, SpO2 98 %.   Intake/Output Summary (Last 24 hours) at 05/13/2018 0930 Last data filed at 05/13/2018 1638 Gross per 24 hour  Intake 1952.37 ml  Output 2100 ml  Net -147.63 ml    Exam Awake Alert, Oriented x 3, No new F.N deficits, Normal affect Stephanie [name].AT,PERRAL Supple Neck,No JVD, No cervical lymphadenopathy appriciated.  Symmetrical Chest wall movement, Good air movement bilaterally, CTAB RRR,No Gallops,Rubs or new Murmurs, No Parasternal Heave +ve B.Sounds, Abd Soft, Non tender, No organomegaly appriciated, No rebound -guarding or rigidity. No Cyanosis, Clubbing or edema, No new Rash or bruise  Data Review   CBC w Diff:  Lab Results  Component Value Date   WBC 5.9 05/11/2018   HGB 15.0 05/11/2018   HCT 43.4 05/11/2018   PLT 221 05/11/2018    CMP:  Lab Results  Component Value Date   NA 142 05/11/2018   K 3.8  05/11/2018   CL 109 05/11/2018   CO2 23 05/11/2018   BUN 16 05/11/2018   CREATININE 1.06 (H) 05/11/2018   PROT 6.7 09/07/2017   ALBUMIN 3.7 09/07/2017   BILITOT 0.5 09/07/2017   ALKPHOS 66 09/07/2017   AST 15 09/07/2017   ALT 9 09/07/2017  .   Total Time in preparing paper work, data evaluation and todays exam - 56 minutes  Epifanio Lesches M.D on 05/13/2018 at 9:30 AM    Note: This dictation was prepared with Dragon dictation along with smaller phrase technology. Any transcriptional errors that result from this process are unintentional.

## 2018-05-13 NOTE — Progress Notes (Signed)
RN stopped IV drip and removed the IV. Patient left with family in a private vehicle.  Phillis Knack, RN

## 2018-05-16 ENCOUNTER — Telehealth: Payer: Self-pay | Admitting: *Deleted

## 2018-05-16 NOTE — Telephone Encounter (Signed)
Pt is returning call. She is at work so she said she will call back later

## 2018-05-16 NOTE — Telephone Encounter (Signed)
Lm requesting return call to complete TCM and confirm hosp f/u appt  

## 2018-05-17 DIAGNOSIS — I872 Venous insufficiency (chronic) (peripheral): Secondary | ICD-10-CM | POA: Insufficient documentation

## 2018-05-17 NOTE — Telephone Encounter (Signed)
Lm requesting return call to complete TCM and confirm hosp f/u appt  

## 2018-05-25 ENCOUNTER — Encounter: Payer: Self-pay | Admitting: Primary Care

## 2018-05-25 ENCOUNTER — Ambulatory Visit (INDEPENDENT_AMBULATORY_CARE_PROVIDER_SITE_OTHER): Payer: Medicare HMO | Admitting: Primary Care

## 2018-05-25 VITALS — BP 120/74 | HR 62 | Temp 98.0°F | Ht 65.5 in | Wt 175.8 lb

## 2018-05-25 DIAGNOSIS — M79671 Pain in right foot: Secondary | ICD-10-CM

## 2018-05-25 DIAGNOSIS — Z09 Encounter for follow-up examination after completed treatment for conditions other than malignant neoplasm: Secondary | ICD-10-CM

## 2018-05-25 DIAGNOSIS — I4891 Unspecified atrial fibrillation: Secondary | ICD-10-CM | POA: Diagnosis not present

## 2018-05-25 DIAGNOSIS — I10 Essential (primary) hypertension: Secondary | ICD-10-CM

## 2018-05-25 DIAGNOSIS — M79673 Pain in unspecified foot: Secondary | ICD-10-CM | POA: Insufficient documentation

## 2018-05-25 NOTE — Progress Notes (Signed)
Subjective:    Patient ID: Stephanie [name], female    DOB: 1951-02-23, 67 y.o.   MRN: [id number]  HPI  Stephanie [name] is a 67 year old female who presents today for TCM hospital follow up.  She presented to [facility] ED on 05/02/18 with complaints of tachycardia, palpitations, dizziness. She was noted to be in atrial fibrillation with RVR and was treated with IV Cardizem with resolve. She was provided with a prescription for oral Cradizem and arranged to follow up with cardiology in the out patient setting. She presented again to [facility] ED on 05/11/18 just after a visit with cardiology with continued symptoms and again RVR. She was initiated on a Cardizem drip and admitted for atrial fibrillation with RVR.   She was initiated on Eliquis prior to her hospital stay. She underwent TEE cardioversion the following day. TEE without evidence of thrombus in the atrium. Cardioversion was successful to NSR without complication. Her metoprolol succinate was changed to 50 mg BID, oral Cardizem was continued. She was discharged home on 05/13/18 with recommendations for cardiology follow up.  Since discharge home she's feeling better, some weakness. She denies palpitations, dizziness, chest pain. She is scheduled for an appointment with her cardiologist on September 25th. She did see her cardiologist last week and diltiazem was discontinued due to lower extremity swelling. She continues metoprolol succinate 50 mg BID and Eliquis 5 mg.   2) Right Heel Pain: Problematic for the last 2 months and pain is constant. Pain occurs during the day and evening. She's tried taking tylenol, applying ice/heat without improvement. She has a history of heel spurs in the past which required cortisone injections.   Review of Systems  Constitutional: Positive for fatigue.  Respiratory: Negative for shortness of breath.   Cardiovascular: Negative for chest pain and palpitations.  Musculoskeletal:       Right heel pain  Neurological:  Negative for dizziness and light-headedness.       Past Medical History:  Diagnosis Date  . A-fib ([place])   . Chronic diarrhea   . Diverticulosis   . Hypertension   . Restless leg      Social History   Socioeconomic History  . Marital status: Single    Spouse name: Not on file  . Number of children: Not on file  . Years of education: Not on file  . Highest education level: Not on file  Occupational History  . Not on file  Social Needs  . Financial resource strain: Not on file  . Food insecurity:    Worry: Not on file    Inability: Not on file  . Transportation needs:    Medical: Not on file    Non-medical: Not on file  Tobacco Use  . Smoking status: Current Every Day Smoker    Packs/day: 1.00    Years: 40.00    Pack years: 40.00    Types: Cigarettes  . Smokeless tobacco: Never Used  Substance and Sexual Activity  . Alcohol use: Yes    Alcohol/week: 0.0 standard drinks    Comment: social  . Drug use: No  . Sexual activity: Never  Lifestyle  . Physical activity:    Days per week: Not on file    Minutes per session: Not on file  . Stress: Not on file  Relationships  . Social connections:    Talks on phone: Not on file    Gets together: Not on file    Attends religious service: Not on file  Active member of club or organization: Not on file    Attends meetings of clubs or organizations: Not on file    Relationship status: Not on file  . Intimate partner violence:    Fear of current or ex partner: Not on file    Emotionally abused: Not on file    Physically abused: Not on file    Forced sexual activity: Not on file  Other Topics Concern  . Not on file  Social History Narrative   Divorced   Just moved from [place].   Originally from [place].   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    Past Surgical History:  Procedure Laterality Date  . CARDIOVERSION N/A 05/12/2018   Procedure: CARDIOVERSION;   Surgeon: Corey Skains, MD;  Location: [facility] ORS;  Service: Cardiovascular;  Laterality: N/A;  . CHOLECYSTECTOMY  2004  . KNEE SURGERY    . TEE WITHOUT CARDIOVERSION N/A 05/12/2018   Procedure: TRANSESOPHAGEAL ECHOCARDIOGRAM (TEE);  Surgeon: Corey Skains, MD;  Location: [facility] ORS;  Service: Cardiovascular;  Laterality: N/A;    No family history on file.  Allergies  Allergen Reactions  . Codeine Other (See Comments)    Cannot tolerate alone, only when mixed with another medication.  . Lactose Intolerance (Gi)   . Phenazopyridine Hcl Hives    Current Outpatient Medications on File Prior to Visit  Medication Sig Dispense Refill  . calcium citrate-vitamin D (CITRACAL+D) 315-200 MG-UNIT per tablet Take 1 tablet by mouth daily.     . cholecalciferol (VITAMIN D) 1000 units tablet Take 1,000 Units by mouth daily.     . Cyanocobalamin (VITAMIN B-12 PO) Take 1,000 mcg by mouth daily.     Marland Kitchen ELIQUIS 5 MG TABS tablet Take 5 mg by mouth 2 (two) times daily.  11  . metoprolol succinate (TOPROL-XL) 50 MG 24 hr tablet Take 50 mg by mouth 2 (two) times daily.  3  . protein supplement shake (PREMIER PROTEIN) LIQD Take 325 mLs (11 oz total) by mouth 2 (two) times daily between meals. 60 Can 0  . sertraline (ZOLOFT) 50 MG tablet Take 1 tablet (50 mg total) by mouth daily. 90 tablet 3   No current facility-administered medications on file prior to visit.     BP 120/74   Pulse 62   Temp 98 F (36.7 C) (Oral)   Ht 5' 5.5" (1.664 m)   Wt 175 lb 12 oz (79.7 kg)   SpO2 98%   BMI 28.80 kg/m    Objective:   Physical Exam  Constitutional: She appears well-nourished.  Cardiovascular: Normal rate, regular rhythm and normal heart sounds.  No murmur heard. Noted PVC x 1  Respiratory: Effort normal and breath sounds normal.  Skin: Skin is warm and dry.           Assessment & Plan:

## 2018-05-25 NOTE — Assessment & Plan Note (Signed)
Chronic and intermittent for quite some time, now more persistent. Offered to check plain films and send to podiatry. She has a podiatrist and prefers to have xrays done in their office. Agree.  Discussed to stretch and exercise the heel. Avoid NSAID's as she is on Eliquis. Tylenol and topical agents PRN.

## 2018-05-25 NOTE — Assessment & Plan Note (Signed)
Confirmed during last two ED visits and also during recent hospital stay. Continue cardiology follow up. Continue Eliquis and Toprol XL as prescribed. Noted one PVC on exam today, otherwise she is in NSR.  All hospital labs, imaging, notes reviewed.

## 2018-05-25 NOTE — Assessment & Plan Note (Signed)
Stable in the office today.  Continue current regimen. 

## 2018-05-25 NOTE — Patient Instructions (Addendum)
Follow up with cardiology as scheduled.  It was a pleasure to see you today!

## 2018-09-16 ENCOUNTER — Telehealth: Payer: Self-pay | Admitting: Primary Care

## 2018-09-16 NOTE — Telephone Encounter (Signed)
I've not seen her for these issues since May 2019 so I'd like to catch up with her in the office first. Please schedule an appointment at her convenience.

## 2018-09-16 NOTE — Telephone Encounter (Signed)
Pt called office requesting a referral to be out in to see a gastroenteroloist. Pt has been seen several times for stomach issues.

## 2018-09-26 ENCOUNTER — Telehealth: Payer: Self-pay | Admitting: Primary Care

## 2018-09-26 NOTE — Telephone Encounter (Signed)
Received a voicemail from this patient requesting a Referral for stomach issues, no Referral founf but saw a phone call on 09/16/18 for same request. Recommendation was for the patient to come in to be seen for the problem by Allie Bossier. I called the patient back and left detailed message on her machine that she would need to be seen in the office before a Referral could be done.

## 2018-09-26 NOTE — Telephone Encounter (Signed)
Stephanie [name], will you please have her scheduled? Thanks!

## 2018-09-27 NOTE — Telephone Encounter (Signed)
Lvm asking pt to call office 

## 2018-10-14 ENCOUNTER — Telehealth: Payer: Self-pay

## 2018-10-14 NOTE — Telephone Encounter (Signed)
Call pt regarding lung screening. Left message for pt to return call.  

## 2018-10-15 ENCOUNTER — Telehealth: Payer: Self-pay

## 2018-10-15 NOTE — Telephone Encounter (Signed)
2nd call  Call pt regarding lung screening. Left message for pt to return call.  

## 2018-10-21 ENCOUNTER — Telehealth: Payer: Self-pay

## 2018-10-21 NOTE — Telephone Encounter (Signed)
Call pt regarding lung screening. Left message for pt to return call.  

## 2018-10-22 ENCOUNTER — Telehealth: Payer: Self-pay

## 2018-10-22 NOTE — Telephone Encounter (Signed)
2nd call  Call pt regarding lung screening. Left message for pt to return call.  

## 2018-10-28 ENCOUNTER — Telehealth: Payer: Self-pay

## 2018-10-28 NOTE — Telephone Encounter (Signed)
Call pt regarding lung screening. Left message for pt to return call.  

## 2018-11-09 ENCOUNTER — Encounter: Payer: Self-pay | Admitting: *Deleted

## 2019-02-07 ENCOUNTER — Encounter: Admission: RE | Payer: Self-pay | Source: Home / Self Care

## 2019-02-07 ENCOUNTER — Ambulatory Visit: Admission: RE | Admit: 2019-02-07 | Payer: Medicare HMO | Source: Home / Self Care | Admitting: Internal Medicine

## 2019-02-07 SURGERY — ESOPHAGOGASTRODUODENOSCOPY (EGD) WITH PROPOFOL
Anesthesia: General

## 2019-05-19 ENCOUNTER — Other Ambulatory Visit
Admission: RE | Admit: 2019-05-19 | Discharge: 2019-05-19 | Disposition: A | Discharge status: Home / Self Care | Payer: Medicare HMO | Source: Ambulatory Visit | Attending: Internal Medicine | Admitting: Internal Medicine

## 2019-05-19 ENCOUNTER — Other Ambulatory Visit: Payer: Self-pay

## 2019-05-19 DIAGNOSIS — Z20828 Contact with and (suspected) exposure to other viral communicable diseases: Secondary | ICD-10-CM | POA: Diagnosis not present

## 2019-05-19 DIAGNOSIS — Z01812 Encounter for preprocedural laboratory examination: Secondary | ICD-10-CM | POA: Insufficient documentation

## 2019-05-19 LAB — SARS CORONAVIRUS 2 (TAT 6-24 HRS): SARS Coronavirus 2: NEGATIVE

## 2019-05-23 ENCOUNTER — Encounter: Payer: Self-pay | Admitting: *Deleted

## 2019-05-24 ENCOUNTER — Ambulatory Visit
Admission: RE | Admit: 2019-05-24 | Discharge: 2019-05-24 | Disposition: A | Discharge status: Home / Self Care | Payer: Medicare HMO | Attending: Internal Medicine | Admitting: Internal Medicine

## 2019-05-24 ENCOUNTER — Encounter
Admission: RE | Disposition: A | Discharge status: Home / Self Care | Payer: Self-pay | Source: Home / Self Care | Attending: Internal Medicine

## 2019-05-24 ENCOUNTER — Ambulatory Visit: Payer: Medicare HMO | Admitting: Certified Registered Nurse Anesthetist

## 2019-05-24 ENCOUNTER — Encounter: Payer: Self-pay | Admitting: Anesthesiology

## 2019-05-24 DIAGNOSIS — K5730 Diverticulosis of large intestine without perforation or abscess without bleeding: Secondary | ICD-10-CM | POA: Diagnosis not present

## 2019-05-24 DIAGNOSIS — Z888 Allergy status to other drugs, medicaments and biological substances status: Secondary | ICD-10-CM | POA: Diagnosis not present

## 2019-05-24 DIAGNOSIS — K2950 Unspecified chronic gastritis without bleeding: Secondary | ICD-10-CM | POA: Diagnosis not present

## 2019-05-24 DIAGNOSIS — Z79899 Other long term (current) drug therapy: Secondary | ICD-10-CM | POA: Diagnosis not present

## 2019-05-24 DIAGNOSIS — F17210 Nicotine dependence, cigarettes, uncomplicated: Secondary | ICD-10-CM | POA: Insufficient documentation

## 2019-05-24 DIAGNOSIS — Z885 Allergy status to narcotic agent status: Secondary | ICD-10-CM | POA: Insufficient documentation

## 2019-05-24 DIAGNOSIS — I10 Essential (primary) hypertension: Secondary | ICD-10-CM | POA: Diagnosis not present

## 2019-05-24 DIAGNOSIS — K591 Functional diarrhea: Secondary | ICD-10-CM | POA: Diagnosis present

## 2019-05-24 DIAGNOSIS — I4891 Unspecified atrial fibrillation: Secondary | ICD-10-CM | POA: Insufficient documentation

## 2019-05-24 DIAGNOSIS — K319 Disease of stomach and duodenum, unspecified: Secondary | ICD-10-CM | POA: Insufficient documentation

## 2019-05-24 DIAGNOSIS — E785 Hyperlipidemia, unspecified: Secondary | ICD-10-CM | POA: Diagnosis not present

## 2019-05-24 DIAGNOSIS — F419 Anxiety disorder, unspecified: Secondary | ICD-10-CM | POA: Insufficient documentation

## 2019-05-24 DIAGNOSIS — G2581 Restless legs syndrome: Secondary | ICD-10-CM | POA: Insufficient documentation

## 2019-05-24 DIAGNOSIS — M1990 Unspecified osteoarthritis, unspecified site: Secondary | ICD-10-CM | POA: Diagnosis not present

## 2019-05-24 DIAGNOSIS — R1030 Lower abdominal pain, unspecified: Secondary | ICD-10-CM | POA: Diagnosis not present

## 2019-05-24 DIAGNOSIS — K648 Other hemorrhoids: Secondary | ICD-10-CM | POA: Diagnosis not present

## 2019-05-24 DIAGNOSIS — Z7901 Long term (current) use of anticoagulants: Secondary | ICD-10-CM | POA: Diagnosis not present

## 2019-05-24 DIAGNOSIS — F329 Major depressive disorder, single episode, unspecified: Secondary | ICD-10-CM | POA: Insufficient documentation

## 2019-05-24 DIAGNOSIS — K2970 Gastritis, unspecified, without bleeding: Secondary | ICD-10-CM | POA: Diagnosis not present

## 2019-05-24 HISTORY — DX: Hyperlipidemia, unspecified: E78.5

## 2019-05-24 HISTORY — DX: Unspecified osteoarthritis, unspecified site: M19.90

## 2019-05-24 HISTORY — PX: COLONOSCOPY WITH PROPOFOL: SHX5780

## 2019-05-24 HISTORY — PX: ESOPHAGOGASTRODUODENOSCOPY (EGD) WITH PROPOFOL: SHX5813

## 2019-05-24 HISTORY — DX: Cardiac arrhythmia, unspecified: I49.9

## 2019-05-24 SURGERY — ESOPHAGOGASTRODUODENOSCOPY (EGD) WITH PROPOFOL
Anesthesia: General

## 2019-05-24 MED ORDER — PROPOFOL 500 MG/50ML IV EMUL
INTRAVENOUS | Status: AC
  Filled 2019-05-24: qty 50

## 2019-05-24 MED ORDER — LIDOCAINE HCL (CARDIAC) PF 100 MG/5ML IV SOSY
PREFILLED_SYRINGE | INTRAVENOUS | Status: DC | PRN
  Administered 2019-05-24: 80 mg via INTRATRACHEAL

## 2019-05-24 MED ORDER — PROPOFOL 10 MG/ML IV BOLUS
INTRAVENOUS | Status: DC | PRN
  Administered 2019-05-24: 50 mg via INTRAVENOUS

## 2019-05-24 MED ORDER — PROPOFOL 500 MG/50ML IV EMUL
INTRAVENOUS | Status: DC | PRN
  Administered 2019-05-24: 150 ug/kg/min via INTRAVENOUS

## 2019-05-24 MED ORDER — LIDOCAINE HCL (PF) 2 % IJ SOLN
INTRAMUSCULAR | Status: AC
  Filled 2019-05-24: qty 10

## 2019-05-24 MED ORDER — SODIUM CHLORIDE 0.9 % IV SOLN
INTRAVENOUS | Status: DC
  Administered 2019-05-24: 13:00:00 via INTRAVENOUS

## 2019-05-24 NOTE — Op Note (Addendum)
[facility] Gastroenterology Patient Name: Stephanie [name] Procedure Date: 05/24/2019 2:18 PM MRN: [id number] Account #: [id number] Date of Birth: 01-08-1951 Admit Type: Outpatient Age: 68 Room: [facility] ENDO ROOM 4 Gender: Female Note Status: Finalized Procedure:            Colonoscopy Indications:          Functional diarrhea Providers:            Stephanie Pike. [place] MD, MD Medicines:            Propofol per Anesthesia Complications:        No immediate complications. Procedure:            Pre-Anesthesia Assessment:                       - The risks and benefits of the procedure and the                        sedation options and risks were discussed with the                        patient. All questions were answered and informed                        consent was obtained.                       - Patient identification and proposed procedure were                        verified prior to the procedure by the nurse. The                        procedure was verified in the procedure room.                       - ASA Grade Assessment: III - A patient with severe                        systemic disease.                       - After reviewing the risks and benefits, the patient                        was deemed in satisfactory condition to undergo the                        procedure.                       After obtaining informed consent, the colonoscope was                        passed under direct vision. Throughout the procedure,                        the patient's blood pressure, pulse, and oxygen                        saturations were monitored continuously. The  Colonoscope was introduced through the anus and                        advanced to the the cecum, identified by appendiceal                        orifice and ileocecal valve. The colonoscopy was                        performed without difficulty. The patient tolerated the                  procedure well. The quality of the bowel preparation                        was good. The ileocecal valve, appendiceal orifice, and                        rectum were photographed. Findings:      The perianal and digital rectal examinations were normal. Pertinent       negatives include normal sphincter tone.      A few small-mouthed diverticula were found in the sigmoid colon.      Normal mucosa was found in the entire colon. Biopsies for histology were       taken with a cold forceps from the random colon for evaluation of       microscopic colitis.      The exam was otherwise without abnormality. Impression:           - Diverticulosis in the sigmoid colon.                       - Normal mucosa in the entire examined colon. Biopsied.                       - The examination was otherwise normal. Recommendation:       - Await pathology results from EGD, also performed                        today.                       - Patient has a contact number available for                        emergencies. The signs and symptoms of potential                        delayed complications were discussed with the patient.                        Return to normal activities tomorrow. Written discharge                        instructions were provided to the patient.                       - Resume previous diet.                       - Continue present medications.                       -  Await pathology results.                       - Repeat colonoscopy in 5 years for surveillance.                       - Return to physician assistant in 4 weeks.                       - You should follow up with Stephanie Blazer, PA-C at the                        time of your office visit. Procedure Code(s):    --- Professional ---                       [phone], Colonoscopy, flexible; with biopsy, single or                        multiple Diagnosis Code(s):    --- Professional ---                        K59.1, Functional diarrhea                       K57.30, Diverticulosis of large intestine without                        perforation or abscess without bleeding CPT copyright 2019 American Medical Association. All rights reserved. The codes documented in this report are preliminary and upon coder review may  be revised to meet current compliance requirements. Stephanie Sella MD, MD 05/24/2019 2:55:11 PM This report has been signed electronically. Number of Addenda: 0 Note Initiated On: 05/24/2019 2:18 PM Scope Withdrawal Time: 0 hours 6 minutes 31 seconds  Total Procedure Duration: 0 hours 10 minutes 27 seconds  Estimated Blood Loss: Estimated blood loss: none.      [facility]

## 2019-05-24 NOTE — Op Note (Addendum)
[facility] Gastroenterology Patient Name: Stephanie [name] Procedure Date: 05/24/2019 2:18 PM MRN: [id number] Account #: [id number] Date of Birth: 21-Sep-1951 Admit Type: Outpatient Age: 68 Room: [facility] ENDO ROOM 4 Gender: Female Note Status: Finalized Procedure:            Upper GI endoscopy Indications:          Lower abdominal pain, Suspected esophageal reflux Providers:            Benay Pike. Tamala Manzer MD, MD Medicines:            Propofol per Anesthesia Complications:        No immediate complications. Procedure:            Pre-Anesthesia Assessment:                       - The risks and benefits of the procedure and the                        sedation options and risks were discussed with the                        patient. All questions were answered and informed                        consent was obtained.                       - Patient identification and proposed procedure were                        verified prior to the procedure by the nurse. The                        procedure was verified in the procedure room.                       - ASA Grade Assessment: III - A patient with severe                        systemic disease.                       - After reviewing the risks and benefits, the patient                        was deemed in satisfactory condition to undergo the                        procedure.                       After obtaining informed consent, the endoscope was                        passed under direct vision. Throughout the procedure,                        the patient's blood pressure, pulse, and oxygen                        saturations were monitored continuously. The Endoscope  was introduced through the mouth, and advanced to the                        third part of duodenum. The upper GI endoscopy was                        accomplished without difficulty. The patient tolerated                        the  procedure well. Findings:      The examined esophagus was normal.      Localized mild inflammation characterized by erythema was found in the       gastric antrum. Biopsies were taken with a cold forceps for Helicobacter       pylori testing.      The examined duodenum was normal.      The cardia and gastric fundus were normal on retroflexion.      The exam was otherwise without abnormality. Impression:           - Normal esophagus.                       - Gastritis. Biopsied.                       - Normal examined duodenum.                       - The examination was otherwise normal. Recommendation:       - Await pathology results.                       - Proceed with colonoscopy Procedure Code(s):    --- Professional ---                       [phone], Esophagogastroduodenoscopy, flexible, transoral;                        with biopsy, single or multiple Diagnosis Code(s):    --- Professional ---                       R10.30, Lower abdominal pain, unspecified                       K29.70, Gastritis, unspecified, without bleeding CPT copyright 2019 American Medical Association. All rights reserved. The codes documented in this report are preliminary and upon coder review may  be revised to meet current compliance requirements. Efrain Sella MD, MD 05/24/2019 2:55:36 PM This report has been signed electronically. Number of Addenda: 0 Note Initiated On: 05/24/2019 2:18 PM Estimated Blood Loss: Estimated blood loss: none.      [facility]

## 2019-05-24 NOTE — H&P (Signed)
Outpatient short stay form Pre-procedure 05/24/2019 12:58 PM Stephanie [name] K. Alice Reichert, M.D.  Primary Physician: Tracie Harrier, M.D.  Reason for visit:  Functional diarrhea.  History of present illness:  Last colonoscopy and EGD performed 5 yrs ago was [place]remark[place]. No [place] loss. Stool studies, celiac panel were negative.    No current facility-administered medications for this encounter.   Medications Prior to Admission  Medication Sig Dispense Refill Last Dose  . calcium citrate-vitamin D (CITRACAL+D) 315-200 MG-UNIT per tablet Take 1 tablet by mouth daily.    Past Week at Unknown time  . cholecalciferol (VITAMIN D) 1000 units tablet Take 1,000 Units by mouth daily.    Past Week at Unknown time  . ELIQUIS 5 MG TABS tablet Take 5 mg by mouth 2 (two) times daily.  11 Past Week at Unknown time  . methylcellulose oral powder Take 1 packet by mouth daily.     . metoprolol succinate (TOPROL-XL) 50 MG 24 hr tablet Take 50 mg by mouth 2 (two) times daily.  3 05/23/2019 at 1700  . sertraline (ZOLOFT) 50 MG tablet Take 1 tablet (50 mg total) by mouth daily. 90 tablet 3 Past Week at Unknown time  . colestipol (COLESTID) 1 g tablet Take 1 g by mouth daily before lunch.   Not Taking at Unknown time  . Cyanocobalamin (VITAMIN B-12 PO) Take 1,000 mcg by mouth daily.    Not Taking at Unknown time  . protein supplement shake (PREMIER PROTEIN) LIQD Take 325 mLs (11 oz total) by mouth 2 (two) times daily between meals. 60 Can 0      Allergies  Allergen Reactions  . Codeine Other (See Comments)    Cannot tolerate alone, only when mixed with another medication.  . Lactose Intolerance (Gi)   . Phenazopyridine Hcl Hives     Past Medical History:  Diagnosis Date  . A-fib ([place])   . Arthritis   . Chronic diarrhea   . Diverticulosis   . Dysrhythmia    ATRIAL FIB  . Hyperlipidemia   . Hypertension   . Restless leg     Review of systems:  Otherwise negative.    Physical Exam  Gen:  Alert, oriented. Appears stated age.  HEENT: [place]/AT. PERRLA. Lungs: CTA, no wheezes. CV: RR nl S1, S2. Abd: soft, benign, no masses. BS+ Ext: No edema. Pulses 2+    Planned procedures: Proceed with colonoscopy. The patient understands the nature of the planned procedure, indications, risks, alternatives and potential complications including but not limited to bleeding, infection, perforation, damage to internal organs and possible oversedation/side effects from anesthesia. The patient agrees and gives consent to proceed.  Please refer to procedure notes for findings, recommendations and patient disposition/instructions.     Laverne Hursey K. Alice Reichert, M.D. Gastroenterology 05/24/2019  12:58 PM

## 2019-05-24 NOTE — Anesthesia Preprocedure Evaluation (Addendum)
Anesthesia Evaluation  Patient identified by MRN, date of birth, ID band Patient awake    Reviewed: Allergy & Precautions, H&P , NPO status , Patient's Chart, lab work & pertinent test results  History of Anesthesia Complications Negative for: history of anesthetic complications  Airway Mallampati: III  TM Distance: <3 FB Neck ROM: limited    Dental  (+) Chipped, Poor Dentition, Caps   Pulmonary neg shortness of breath, COPD, Current Smoker and Patient abstained from smoking.,           Cardiovascular Exercise Tolerance: Good hypertension, (-) angina(-) Past MI + dysrhythmias Atrial Fibrillation      Neuro/Psych PSYCHIATRIC DISORDERS Anxiety Depression negative neurological ROS     GI/Hepatic negative GI ROS, Neg liver ROS, neg GERD  ,  Endo/Other  negative endocrine ROS  Renal/GU negative Renal ROS  negative genitourinary   Musculoskeletal   Abdominal   Peds  Hematology negative hematology ROS (+)   Anesthesia Other Findings Past Medical History: No date: A-fib (HCC) No date: Chronic diarrhea No date: Diverticulosis No date: Hypertension No date: Restless leg  Past Surgical History: 2004: CHOLECYSTECTOMY No date: KNEE SURGERY  BMI    Body Mass Index:  30.21 kg/m      Reproductive/Obstetrics negative OB ROS                             Anesthesia Physical  Anesthesia Plan  ASA: III  Anesthesia Plan: General   Post-op Pain Management:    Induction: Intravenous  PONV Risk Score and Plan: Propofol infusion and TIVA  Airway Management Planned: Nasal Cannula and Natural Airway  Additional Equipment:   Intra-op Plan:   Post-operative Plan:   Informed Consent: I have reviewed the patients History and Physical, chart, labs and discussed the procedure including the risks, benefits and alternatives for the proposed anesthesia with the patient or authorized representative  who has indicated his/her understanding and acceptance.     Dental Advisory Given  Plan Discussed with: Anesthesiologist, CRNA and Surgeon  Anesthesia Plan Comments: (Patient consented for risks of anesthesia including but not limited to:  - adverse reactions to medications - risk of intubation if required - damage to teeth, lips or other oral mucosa - sore throat or hoarseness - Damage to heart, brain, lungs or loss of life  Patient voiced understanding.)        Anesthesia Quick Evaluation

## 2019-05-24 NOTE — Interval H&P Note (Signed)
History and Physical Interval Note:  05/24/2019 1:44 PM  Stephanie [name]  has presented today for surgery, with the diagnosis of [place].  The various methods of treatment have been discussed with the patient and family. After consideration of risks, benefits and other options for treatment, the patient has consented to  Procedure(s): ESOPHAGOGASTRODUODENOSCOPY (EGD) WITH PROPOFOL (N/A) COLONOSCOPY WITH PROPOFOL (N/A) as a surgical intervention.  The patient's history has been reviewed, patient examined, no change in status, stable for surgery.  I have reviewed the patient's chart and labs.  Questions were answered to the patient's satisfaction.     [place], [place]

## 2019-05-24 NOTE — Transfer of Care (Signed)
Immediate Anesthesia Transfer of Care Note  Patient: Stephanie [name]  Procedure(s) Performed: ESOPHAGOGASTRODUODENOSCOPY (EGD) WITH PROPOFOL (N/A ) COLONOSCOPY WITH PROPOFOL (N/A )  Patient Location: PACU  Anesthesia Type:General  Level of Consciousness: sedated  Airway & Oxygen Therapy: Patient Spontanous Breathing and Patient connected to nasal cannula oxygen  Post-op Assessment: Report given to RN and Post -op Vital signs reviewed and stable  Post vital signs: Reviewed and stable  Last Vitals:  Vitals Value Taken Time  BP 112/45 05/24/19 1455  Temp    Pulse 70 05/24/19 1456  Resp 16 05/24/19 1456  SpO2 99 % 05/24/19 1456  Vitals shown include unvalidated device data.  Last Pain:  Vitals:   05/24/19 1455  TempSrc: (P) Tympanic  PainSc:          Complications: No apparent anesthesia complications

## 2019-05-24 NOTE — Interval H&P Note (Signed)
History and Physical Interval Note:  05/24/2019 2:19 PM  Stephanie [name]  has presented today for surgery, with the diagnosis of [place].  The various methods of treatment have been discussed with the patient and family. After consideration of risks, benefits and other options for treatment, the patient has consented to  Procedure(s): ESOPHAGOGASTRODUODENOSCOPY (EGD) WITH PROPOFOL (N/A) COLONOSCOPY WITH PROPOFOL (N/A) as a surgical intervention.  The patient's history has been reviewed, patient examined, no change in status, stable for surgery.  I have reviewed the patient's chart and labs.  Questions were answered to the patient's satisfaction.     [place], [place]

## 2019-05-24 NOTE — Anesthesia Post-op Follow-up Note (Signed)
Anesthesia QCDR form completed.        

## 2019-05-25 ENCOUNTER — Encounter: Payer: Self-pay | Admitting: Internal Medicine

## 2019-05-25 NOTE — Anesthesia Postprocedure Evaluation (Signed)
Anesthesia Post Note  Patient: Stephanie [name]  Procedure(s) Performed: ESOPHAGOGASTRODUODENOSCOPY (EGD) WITH PROPOFOL (N/A ) COLONOSCOPY WITH PROPOFOL (N/A )  Patient location during evaluation: Endoscopy Anesthesia Type: General Level of consciousness: awake and alert Pain management: pain level controlled Vital Signs Assessment: post-procedure vital signs reviewed and stable Respiratory status: spontaneous breathing, nonlabored ventilation, respiratory function stable and patient connected to nasal cannula oxygen Cardiovascular status: blood pressure returned to baseline and stable Postop Assessment: no apparent nausea or vomiting Anesthetic complications: no     Last Vitals:  Vitals:   05/24/19 1505 05/24/19 1515  BP: 135/72 (!) 147/89  Pulse: 74 69  Resp: (!) 26 19  Temp:    SpO2: 100% 100%    Last Pain:  Vitals:   05/25/19 0737  TempSrc:   PainSc: 0-No pain                 Precious Haws Lasharn [place]

## 2019-05-26 LAB — SURGICAL PATHOLOGY

## 2019-10-06 DIAGNOSIS — F325 Major depressive disorder, single episode, in full remission: Secondary | ICD-10-CM | POA: Diagnosis not present

## 2019-10-06 DIAGNOSIS — I480 Paroxysmal atrial fibrillation: Secondary | ICD-10-CM | POA: Diagnosis not present

## 2019-10-06 DIAGNOSIS — Z0000 Encounter for general adult medical examination without abnormal findings: Secondary | ICD-10-CM | POA: Diagnosis not present

## 2019-10-06 DIAGNOSIS — J449 Chronic obstructive pulmonary disease, unspecified: Secondary | ICD-10-CM | POA: Diagnosis not present

## 2019-10-06 DIAGNOSIS — F17210 Nicotine dependence, cigarettes, uncomplicated: Secondary | ICD-10-CM | POA: Diagnosis not present

## 2019-10-06 DIAGNOSIS — R195 Other fecal abnormalities: Secondary | ICD-10-CM | POA: Diagnosis not present

## 2019-12-14 DIAGNOSIS — H04123 Dry eye syndrome of bilateral lacrimal glands: Secondary | ICD-10-CM | POA: Diagnosis not present

## 2019-12-14 DIAGNOSIS — I10 Essential (primary) hypertension: Secondary | ICD-10-CM | POA: Diagnosis not present

## 2019-12-14 DIAGNOSIS — H25813 Combined forms of age-related cataract, bilateral: Secondary | ICD-10-CM | POA: Diagnosis not present

## 2019-12-14 DIAGNOSIS — H35033 Hypertensive retinopathy, bilateral: Secondary | ICD-10-CM | POA: Diagnosis not present

## 2019-12-21 ENCOUNTER — Ambulatory Visit: Payer: Medicare HMO | Attending: Internal Medicine

## 2019-12-21 DIAGNOSIS — Z23 Encounter for immunization: Secondary | ICD-10-CM

## 2019-12-21 NOTE — Progress Notes (Signed)
   Covid-19 Vaccination Clinic  Name:  Stephanie [name]    MRN: [id number] DOB: 28-May-1951  12/21/2019  Stephanie [name] was observed post Covid-19 immunization for 15 minutes without incident. She was provided with Vaccine Information Sheet and instruction to access the V-Safe system.   Stephanie [name] was instructed to call 911 with any severe reactions post vaccine: [place] Kitchen Difficulty breathing  . Swelling of face and throat  . A fast heartbeat  . A bad rash all over body  . Dizziness and weakness   Immunizations Administered    Name Date Dose VIS Date Route   Pfizer COVID-19 Vaccine 12/21/2019  8:11 AM 0.3 mL 09/01/2019 Intramuscular   Manufacturer: [place]   Lot: [id number]   [place]: [id number]

## 2020-01-05 DIAGNOSIS — Z01818 Encounter for other preprocedural examination: Secondary | ICD-10-CM | POA: Diagnosis not present

## 2020-01-05 DIAGNOSIS — H2511 Age-related nuclear cataract, right eye: Secondary | ICD-10-CM | POA: Diagnosis not present

## 2020-01-10 DIAGNOSIS — Z720 Tobacco use: Secondary | ICD-10-CM | POA: Diagnosis not present

## 2020-01-10 DIAGNOSIS — I872 Venous insufficiency (chronic) (peripheral): Secondary | ICD-10-CM | POA: Diagnosis not present

## 2020-01-10 DIAGNOSIS — I10 Essential (primary) hypertension: Secondary | ICD-10-CM | POA: Diagnosis not present

## 2020-01-10 DIAGNOSIS — F325 Major depressive disorder, single episode, in full remission: Secondary | ICD-10-CM | POA: Diagnosis not present

## 2020-01-10 DIAGNOSIS — I480 Paroxysmal atrial fibrillation: Secondary | ICD-10-CM | POA: Diagnosis not present

## 2020-01-10 DIAGNOSIS — E782 Mixed hyperlipidemia: Secondary | ICD-10-CM | POA: Diagnosis not present

## 2020-01-10 DIAGNOSIS — I4891 Unspecified atrial fibrillation: Secondary | ICD-10-CM | POA: Diagnosis not present

## 2020-01-10 DIAGNOSIS — J449 Chronic obstructive pulmonary disease, unspecified: Secondary | ICD-10-CM | POA: Diagnosis not present

## 2020-01-10 DIAGNOSIS — F419 Anxiety disorder, unspecified: Secondary | ICD-10-CM | POA: Diagnosis not present

## 2020-01-15 DIAGNOSIS — H2511 Age-related nuclear cataract, right eye: Secondary | ICD-10-CM | POA: Diagnosis not present

## 2020-01-16 ENCOUNTER — Ambulatory Visit: Payer: Medicare HMO | Attending: Internal Medicine

## 2020-01-16 DIAGNOSIS — J439 Emphysema, unspecified: Secondary | ICD-10-CM | POA: Diagnosis not present

## 2020-01-16 DIAGNOSIS — R1031 Right lower quadrant pain: Secondary | ICD-10-CM | POA: Diagnosis not present

## 2020-01-16 DIAGNOSIS — K529 Noninfective gastroenteritis and colitis, unspecified: Secondary | ICD-10-CM | POA: Diagnosis not present

## 2020-01-16 DIAGNOSIS — Z23 Encounter for immunization: Secondary | ICD-10-CM

## 2020-01-16 DIAGNOSIS — G4700 Insomnia, unspecified: Secondary | ICD-10-CM | POA: Diagnosis not present

## 2020-01-16 DIAGNOSIS — F329 Major depressive disorder, single episode, unspecified: Secondary | ICD-10-CM | POA: Diagnosis not present

## 2020-01-16 DIAGNOSIS — G4730 Sleep apnea, unspecified: Secondary | ICD-10-CM | POA: Diagnosis not present

## 2020-01-16 DIAGNOSIS — R1032 Left lower quadrant pain: Secondary | ICD-10-CM | POA: Diagnosis not present

## 2020-01-16 DIAGNOSIS — F419 Anxiety disorder, unspecified: Secondary | ICD-10-CM | POA: Diagnosis not present

## 2020-01-16 DIAGNOSIS — Z720 Tobacco use: Secondary | ICD-10-CM | POA: Diagnosis not present

## 2020-01-16 DIAGNOSIS — E663 Overweight: Secondary | ICD-10-CM | POA: Diagnosis not present

## 2020-01-16 DIAGNOSIS — R0600 Dyspnea, unspecified: Secondary | ICD-10-CM | POA: Diagnosis not present

## 2020-01-16 DIAGNOSIS — Z7901 Long term (current) use of anticoagulants: Secondary | ICD-10-CM | POA: Diagnosis not present

## 2020-01-16 DIAGNOSIS — I480 Paroxysmal atrial fibrillation: Secondary | ICD-10-CM | POA: Diagnosis not present

## 2020-01-16 NOTE — Progress Notes (Signed)
   Covid-19 Vaccination Clinic  Name:  Stephanie [name]    MRN: [id number] DOB: 1951/05/06  01/16/2020  [name] was observed post Covid-19 immunization for 15 minutes without incident. She was provided with Vaccine Information Sheet and instruction to access the V-Safe system.   [name] was instructed to call 911 with any severe reactions post vaccine: Marland Kitchen Difficulty breathing  . Swelling of face and throat  . A fast heartbeat  . A bad rash all over body  . Dizziness and weakness   Immunizations Administered    Name Date Dose VIS Date Route   Pfizer COVID-19 Vaccine 01/16/2020 11:29 AM 0.3 mL 11/15/2018 Intramuscular   Manufacturer: [place]   Lot: [id number]   [place]: [id number]

## 2020-01-22 DIAGNOSIS — E782 Mixed hyperlipidemia: Secondary | ICD-10-CM | POA: Diagnosis not present

## 2020-01-22 DIAGNOSIS — I10 Essential (primary) hypertension: Secondary | ICD-10-CM | POA: Diagnosis not present

## 2020-01-22 DIAGNOSIS — F329 Major depressive disorder, single episode, unspecified: Secondary | ICD-10-CM | POA: Diagnosis not present

## 2020-01-22 DIAGNOSIS — I480 Paroxysmal atrial fibrillation: Secondary | ICD-10-CM | POA: Diagnosis not present

## 2020-01-22 DIAGNOSIS — F325 Major depressive disorder, single episode, in full remission: Secondary | ICD-10-CM | POA: Diagnosis not present

## 2020-01-22 DIAGNOSIS — Z0000 Encounter for general adult medical examination without abnormal findings: Secondary | ICD-10-CM | POA: Diagnosis not present

## 2020-01-22 DIAGNOSIS — K529 Noninfective gastroenteritis and colitis, unspecified: Secondary | ICD-10-CM | POA: Diagnosis not present

## 2020-01-22 DIAGNOSIS — F419 Anxiety disorder, unspecified: Secondary | ICD-10-CM | POA: Diagnosis not present

## 2020-01-22 DIAGNOSIS — R1032 Left lower quadrant pain: Secondary | ICD-10-CM | POA: Diagnosis not present

## 2020-01-22 DIAGNOSIS — R1031 Right lower quadrant pain: Secondary | ICD-10-CM | POA: Diagnosis not present

## 2020-01-22 DIAGNOSIS — R195 Other fecal abnormalities: Secondary | ICD-10-CM | POA: Diagnosis not present

## 2020-01-22 DIAGNOSIS — R8290 Unspecified abnormal findings in urine: Secondary | ICD-10-CM | POA: Diagnosis not present

## 2020-01-22 DIAGNOSIS — Z720 Tobacco use: Secondary | ICD-10-CM | POA: Diagnosis not present

## 2020-02-02 DIAGNOSIS — H2512 Age-related nuclear cataract, left eye: Secondary | ICD-10-CM | POA: Diagnosis not present

## 2020-02-05 ENCOUNTER — Other Ambulatory Visit: Payer: Self-pay | Admitting: Internal Medicine

## 2020-02-05 DIAGNOSIS — Z1231 Encounter for screening mammogram for malignant neoplasm of breast: Secondary | ICD-10-CM

## 2020-02-05 DIAGNOSIS — M545 Low back pain: Secondary | ICD-10-CM | POA: Diagnosis not present

## 2020-02-05 DIAGNOSIS — I480 Paroxysmal atrial fibrillation: Secondary | ICD-10-CM | POA: Diagnosis not present

## 2020-02-05 DIAGNOSIS — F325 Major depressive disorder, single episode, in full remission: Secondary | ICD-10-CM | POA: Diagnosis not present

## 2020-02-05 DIAGNOSIS — R197 Diarrhea, unspecified: Secondary | ICD-10-CM | POA: Diagnosis not present

## 2020-02-05 DIAGNOSIS — L219 Seborrheic dermatitis, unspecified: Secondary | ICD-10-CM | POA: Diagnosis not present

## 2020-02-05 DIAGNOSIS — J449 Chronic obstructive pulmonary disease, unspecified: Secondary | ICD-10-CM | POA: Diagnosis not present

## 2020-02-05 DIAGNOSIS — N390 Urinary tract infection, site not specified: Secondary | ICD-10-CM | POA: Diagnosis not present

## 2020-02-05 DIAGNOSIS — M67442 Ganglion, left hand: Secondary | ICD-10-CM | POA: Diagnosis not present

## 2020-02-05 DIAGNOSIS — Z79899 Other long term (current) drug therapy: Secondary | ICD-10-CM | POA: Diagnosis not present

## 2020-02-08 DIAGNOSIS — M8588 Other specified disorders of bone density and structure, other site: Secondary | ICD-10-CM | POA: Diagnosis not present

## 2020-02-13 ENCOUNTER — Ambulatory Visit
Admission: RE | Admit: 2020-02-13 | Discharge: 2020-02-13 | Disposition: A | Discharge status: Home / Self Care | Payer: Medicare HMO | Source: Ambulatory Visit | Attending: Internal Medicine | Admitting: Internal Medicine

## 2020-02-13 DIAGNOSIS — Z1231 Encounter for screening mammogram for malignant neoplasm of breast: Secondary | ICD-10-CM | POA: Diagnosis not present

## 2020-02-14 DIAGNOSIS — H2512 Age-related nuclear cataract, left eye: Secondary | ICD-10-CM | POA: Diagnosis not present

## 2020-03-14 DIAGNOSIS — D2262 Melanocytic nevi of left upper limb, including shoulder: Secondary | ICD-10-CM | POA: Diagnosis not present

## 2020-03-14 DIAGNOSIS — D485 Neoplasm of uncertain behavior of skin: Secondary | ICD-10-CM | POA: Diagnosis not present

## 2020-03-14 DIAGNOSIS — D2239 Melanocytic nevi of other parts of face: Secondary | ICD-10-CM | POA: Diagnosis not present

## 2020-03-14 DIAGNOSIS — D2261 Melanocytic nevi of right upper limb, including shoulder: Secondary | ICD-10-CM | POA: Diagnosis not present

## 2020-03-14 DIAGNOSIS — D225 Melanocytic nevi of trunk: Secondary | ICD-10-CM | POA: Diagnosis not present

## 2020-03-14 DIAGNOSIS — L821 Other seborrheic keratosis: Secondary | ICD-10-CM | POA: Diagnosis not present

## 2020-03-14 DIAGNOSIS — L309 Dermatitis, unspecified: Secondary | ICD-10-CM | POA: Diagnosis not present

## 2020-03-28 DIAGNOSIS — H524 Presbyopia: Secondary | ICD-10-CM | POA: Diagnosis not present

## 2020-03-28 DIAGNOSIS — Z0100 Encounter for examination of eyes and vision without abnormal findings: Secondary | ICD-10-CM | POA: Diagnosis not present

## 2020-03-28 DIAGNOSIS — H52223 Regular astigmatism, bilateral: Secondary | ICD-10-CM | POA: Diagnosis not present

## 2020-03-28 DIAGNOSIS — Z961 Presence of intraocular lens: Secondary | ICD-10-CM | POA: Diagnosis not present

## 2020-04-09 DIAGNOSIS — R42 Dizziness and giddiness: Secondary | ICD-10-CM | POA: Diagnosis not present

## 2020-04-09 DIAGNOSIS — I480 Paroxysmal atrial fibrillation: Secondary | ICD-10-CM | POA: Diagnosis not present

## 2020-04-09 DIAGNOSIS — F17210 Nicotine dependence, cigarettes, uncomplicated: Secondary | ICD-10-CM | POA: Diagnosis not present

## 2020-05-01 DIAGNOSIS — J40 Bronchitis, not specified as acute or chronic: Secondary | ICD-10-CM | POA: Diagnosis not present

## 2020-05-15 DIAGNOSIS — M4184 Other forms of scoliosis, thoracic region: Secondary | ICD-10-CM | POA: Diagnosis not present

## 2020-05-15 DIAGNOSIS — J40 Bronchitis, not specified as acute or chronic: Secondary | ICD-10-CM | POA: Diagnosis not present

## 2020-05-15 DIAGNOSIS — M47814 Spondylosis without myelopathy or radiculopathy, thoracic region: Secondary | ICD-10-CM | POA: Diagnosis not present

## 2020-07-11 DIAGNOSIS — J439 Emphysema, unspecified: Secondary | ICD-10-CM | POA: Diagnosis not present

## 2020-07-11 DIAGNOSIS — Z720 Tobacco use: Secondary | ICD-10-CM | POA: Diagnosis not present

## 2020-07-11 DIAGNOSIS — I4891 Unspecified atrial fibrillation: Secondary | ICD-10-CM | POA: Diagnosis not present

## 2020-07-11 DIAGNOSIS — I10 Essential (primary) hypertension: Secondary | ICD-10-CM | POA: Diagnosis not present

## 2020-07-11 DIAGNOSIS — R001 Bradycardia, unspecified: Secondary | ICD-10-CM | POA: Diagnosis not present

## 2020-07-11 DIAGNOSIS — I872 Venous insufficiency (chronic) (peripheral): Secondary | ICD-10-CM | POA: Diagnosis not present

## 2020-07-11 DIAGNOSIS — R0609 Other forms of dyspnea: Secondary | ICD-10-CM | POA: Diagnosis not present

## 2020-07-11 DIAGNOSIS — E782 Mixed hyperlipidemia: Secondary | ICD-10-CM | POA: Diagnosis not present

## 2020-07-15 DIAGNOSIS — R2232 Localized swelling, mass and lump, left upper limb: Secondary | ICD-10-CM | POA: Diagnosis not present

## 2020-07-15 DIAGNOSIS — M25572 Pain in left ankle and joints of left foot: Secondary | ICD-10-CM | POA: Diagnosis not present

## 2020-07-16 DIAGNOSIS — J310 Chronic rhinitis: Secondary | ICD-10-CM | POA: Diagnosis not present

## 2020-07-16 DIAGNOSIS — I700 Atherosclerosis of aorta: Secondary | ICD-10-CM | POA: Diagnosis not present

## 2020-07-16 DIAGNOSIS — R059 Cough, unspecified: Secondary | ICD-10-CM | POA: Diagnosis not present

## 2020-07-16 DIAGNOSIS — J439 Emphysema, unspecified: Secondary | ICD-10-CM | POA: Diagnosis not present

## 2020-07-16 DIAGNOSIS — R0600 Dyspnea, unspecified: Secondary | ICD-10-CM | POA: Diagnosis not present

## 2020-07-16 DIAGNOSIS — F17210 Nicotine dependence, cigarettes, uncomplicated: Secondary | ICD-10-CM | POA: Diagnosis not present

## 2020-08-02 DIAGNOSIS — F419 Anxiety disorder, unspecified: Secondary | ICD-10-CM | POA: Diagnosis not present

## 2020-08-02 DIAGNOSIS — E782 Mixed hyperlipidemia: Secondary | ICD-10-CM | POA: Diagnosis not present

## 2020-08-02 DIAGNOSIS — I480 Paroxysmal atrial fibrillation: Secondary | ICD-10-CM | POA: Diagnosis not present

## 2020-08-02 DIAGNOSIS — G2581 Restless legs syndrome: Secondary | ICD-10-CM | POA: Diagnosis not present

## 2020-08-02 DIAGNOSIS — M67449 Ganglion, unspecified hand: Secondary | ICD-10-CM | POA: Diagnosis not present

## 2020-08-02 DIAGNOSIS — R195 Other fecal abnormalities: Secondary | ICD-10-CM | POA: Diagnosis not present

## 2020-08-02 DIAGNOSIS — I10 Essential (primary) hypertension: Secondary | ICD-10-CM | POA: Diagnosis not present

## 2020-08-02 DIAGNOSIS — L299 Pruritus, unspecified: Secondary | ICD-10-CM | POA: Diagnosis not present

## 2020-08-02 DIAGNOSIS — E559 Vitamin D deficiency, unspecified: Secondary | ICD-10-CM | POA: Diagnosis not present

## 2020-08-09 DIAGNOSIS — Z79899 Other long term (current) drug therapy: Secondary | ICD-10-CM | POA: Diagnosis not present

## 2020-08-09 DIAGNOSIS — I10 Essential (primary) hypertension: Secondary | ICD-10-CM | POA: Diagnosis not present

## 2020-08-09 DIAGNOSIS — M8580 Other specified disorders of bone density and structure, unspecified site: Secondary | ICD-10-CM | POA: Diagnosis not present

## 2020-08-09 DIAGNOSIS — Z0000 Encounter for general adult medical examination without abnormal findings: Secondary | ICD-10-CM | POA: Diagnosis not present

## 2020-08-09 DIAGNOSIS — Z720 Tobacco use: Secondary | ICD-10-CM | POA: Diagnosis not present

## 2020-08-09 DIAGNOSIS — F32A Depression, unspecified: Secondary | ICD-10-CM | POA: Diagnosis not present

## 2020-08-09 DIAGNOSIS — Z23 Encounter for immunization: Secondary | ICD-10-CM | POA: Diagnosis not present

## 2020-08-09 DIAGNOSIS — I480 Paroxysmal atrial fibrillation: Secondary | ICD-10-CM | POA: Diagnosis not present

## 2020-08-09 DIAGNOSIS — R195 Other fecal abnormalities: Secondary | ICD-10-CM | POA: Diagnosis not present

## 2020-08-09 DIAGNOSIS — J449 Chronic obstructive pulmonary disease, unspecified: Secondary | ICD-10-CM | POA: Diagnosis not present

## 2021-01-18 IMAGING — MG DIGITAL SCREENING BILAT W/ TOMO W/ CAD
6 of 10 series · 6 of 30 positions shown · non-contrast
Comparison: Previous exam(s).

CLINICAL DATA: Screening.

EXAM:
DIGITAL SCREENING BILATERAL MAMMOGRAM WITH TOMO AND CAD

[R MLO synth-2D]
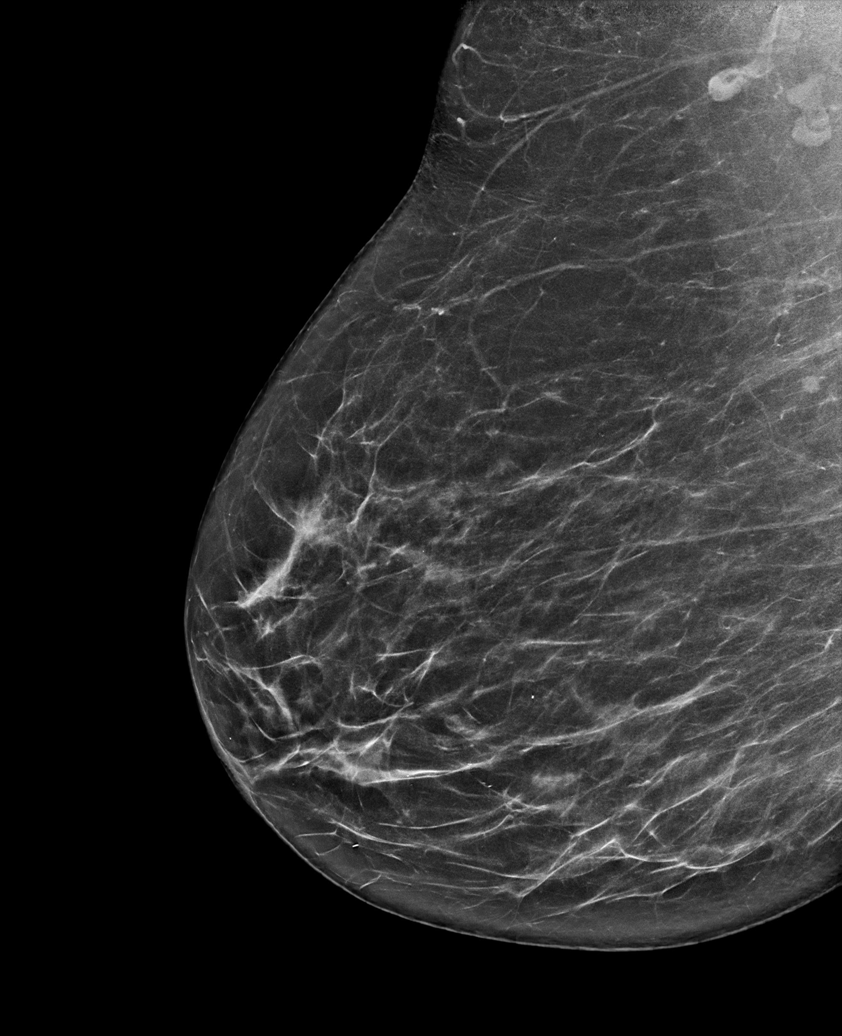

[L MLO synth-2D]
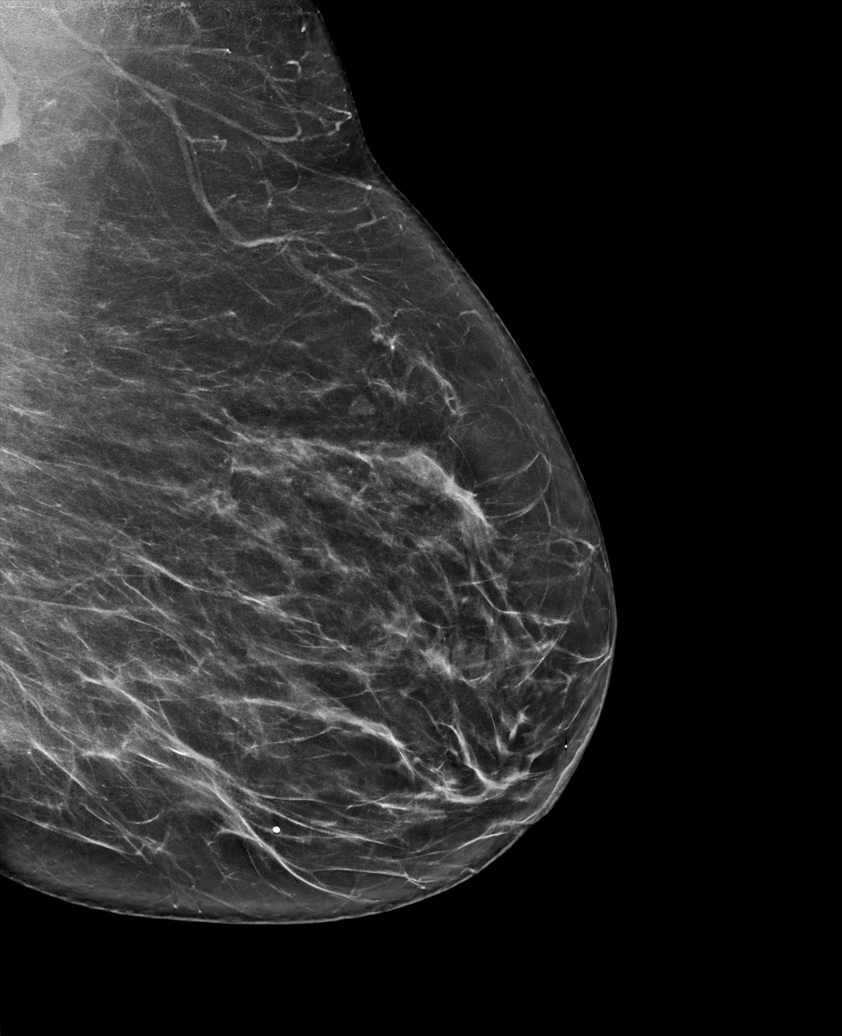

[L CC synth-2D]
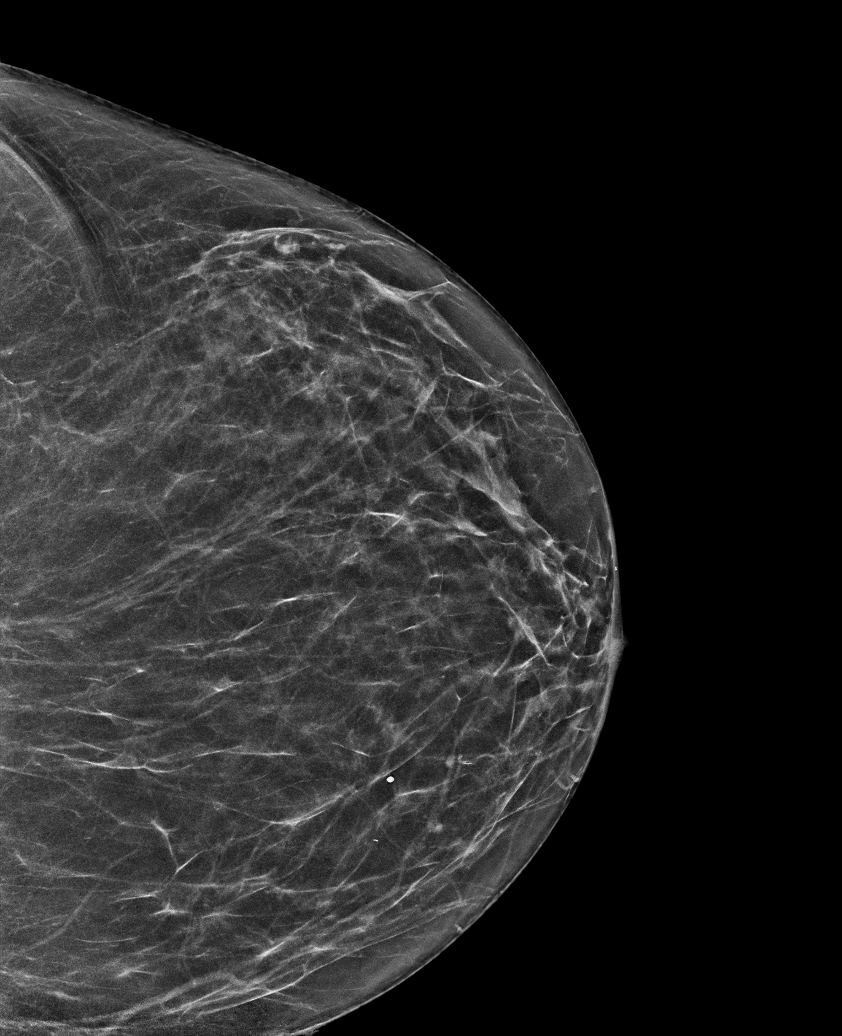

[R CC synth-2D]
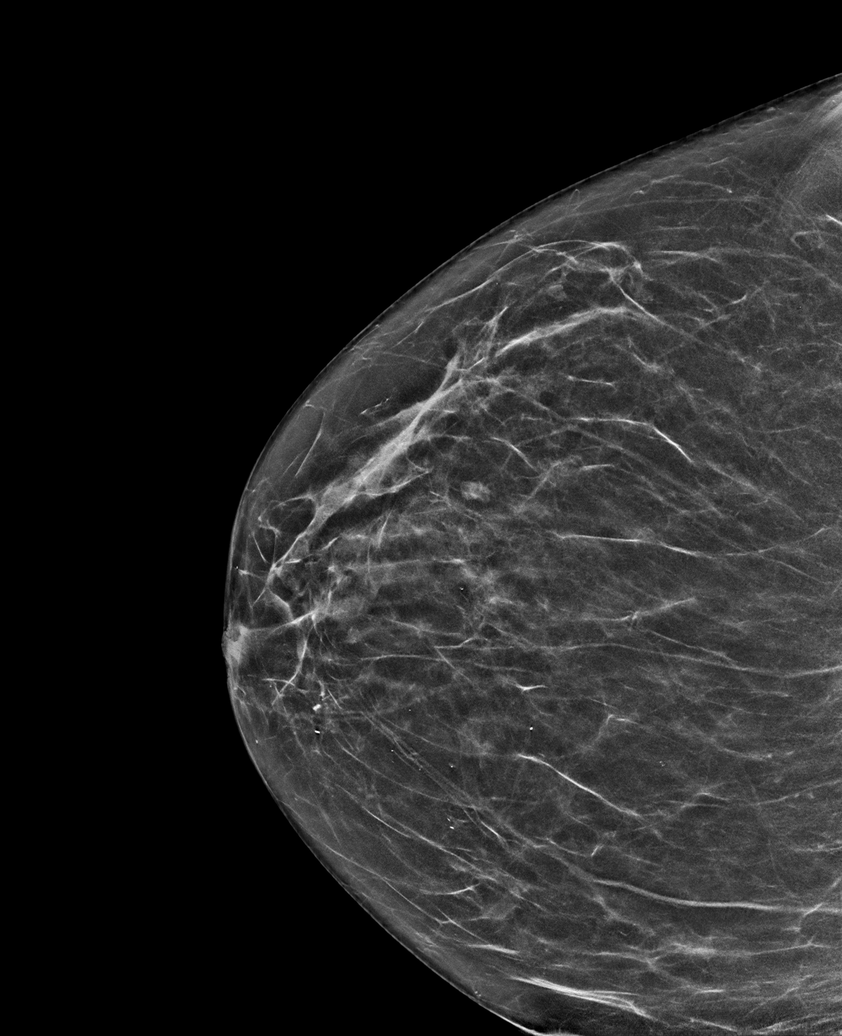

[L XCCM synth-2D]
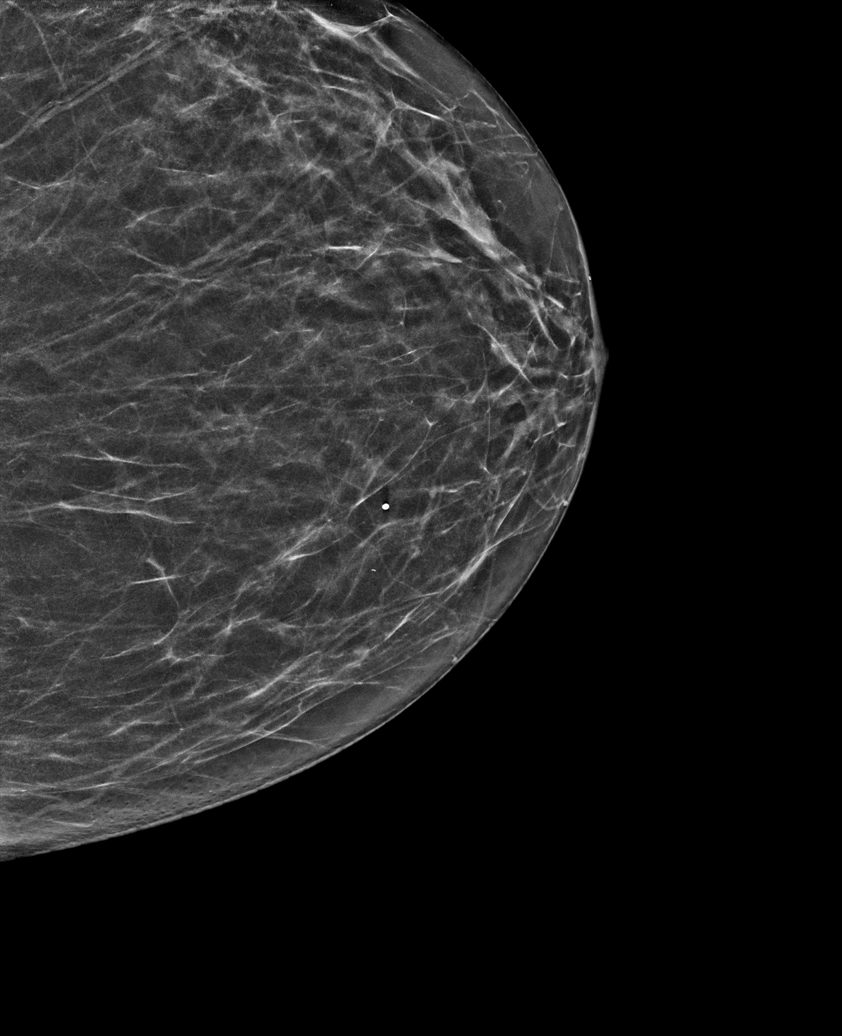

[L CC tomo · tomo slice 31/60.0]
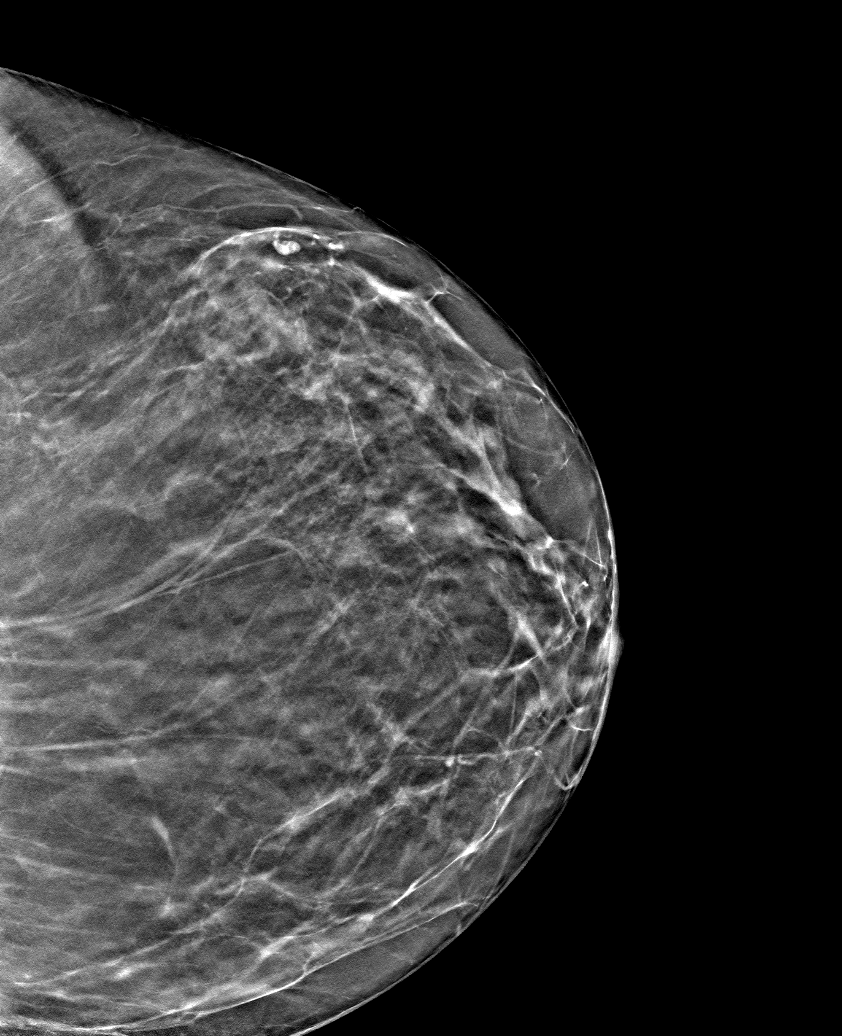

[6 of 30 positions shown; findings below may reference images not displayed]

ACR Breast Density Category b: There are scattered areas of
fibroglandular density.
FINDINGS: There are no findings suspicious for malignancy. Images were
processed with CAD.
IMPRESSION: No mammographic evidence of malignancy. A result letter of this
screening mammogram will be mailed directly to the patient.

RECOMMENDATION:
Screening mammogram in one year. (Code:[id number])

BI-RADS CATEGORY  1: Negative.

## 2021-03-05 DIAGNOSIS — H524 Presbyopia: Secondary | ICD-10-CM | POA: Diagnosis not present

## 2021-03-05 DIAGNOSIS — Z0100 Encounter for examination of eyes and vision without abnormal findings: Secondary | ICD-10-CM | POA: Diagnosis not present

## 2021-03-20 DIAGNOSIS — H57813 Brow ptosis, bilateral: Secondary | ICD-10-CM | POA: Diagnosis not present

## 2021-03-26 DIAGNOSIS — M5416 Radiculopathy, lumbar region: Secondary | ICD-10-CM | POA: Diagnosis not present

## 2021-04-09 DIAGNOSIS — F325 Major depressive disorder, single episode, in full remission: Secondary | ICD-10-CM | POA: Diagnosis not present

## 2021-04-09 DIAGNOSIS — G2581 Restless legs syndrome: Secondary | ICD-10-CM | POA: Diagnosis not present

## 2021-04-09 DIAGNOSIS — I10 Essential (primary) hypertension: Secondary | ICD-10-CM | POA: Diagnosis not present

## 2021-04-09 DIAGNOSIS — R61 Generalized hyperhidrosis: Secondary | ICD-10-CM | POA: Diagnosis not present

## 2021-04-09 DIAGNOSIS — R0683 Snoring: Secondary | ICD-10-CM | POA: Diagnosis not present

## 2021-04-09 DIAGNOSIS — Z1231 Encounter for screening mammogram for malignant neoplasm of breast: Secondary | ICD-10-CM | POA: Diagnosis not present

## 2021-04-09 DIAGNOSIS — I480 Paroxysmal atrial fibrillation: Secondary | ICD-10-CM | POA: Diagnosis not present

## 2021-04-09 DIAGNOSIS — R197 Diarrhea, unspecified: Secondary | ICD-10-CM | POA: Diagnosis not present

## 2021-04-15 ENCOUNTER — Emergency Department: Payer: Medicare HMO

## 2021-04-15 ENCOUNTER — Other Ambulatory Visit: Payer: Self-pay

## 2021-04-15 ENCOUNTER — Observation Stay
Admission: EM | Admit: 2021-04-15 | Discharge: 2021-04-17 | Disposition: A | Discharge status: Home / Self Care | Payer: Medicare HMO | Attending: Internal Medicine | Admitting: Internal Medicine

## 2021-04-15 DIAGNOSIS — I4891 Unspecified atrial fibrillation: Principal | ICD-10-CM | POA: Diagnosis present

## 2021-04-15 DIAGNOSIS — I2699 Other pulmonary embolism without acute cor pulmonale: Secondary | ICD-10-CM | POA: Diagnosis not present

## 2021-04-15 DIAGNOSIS — I5021 Acute systolic (congestive) heart failure: Secondary | ICD-10-CM

## 2021-04-15 DIAGNOSIS — Z20822 Contact with and (suspected) exposure to covid-19: Secondary | ICD-10-CM | POA: Diagnosis not present

## 2021-04-15 DIAGNOSIS — Z7901 Long term (current) use of anticoagulants: Secondary | ICD-10-CM | POA: Insufficient documentation

## 2021-04-15 DIAGNOSIS — I517 Cardiomegaly: Secondary | ICD-10-CM | POA: Diagnosis not present

## 2021-04-15 DIAGNOSIS — Z87891 Personal history of nicotine dependence: Secondary | ICD-10-CM | POA: Diagnosis not present

## 2021-04-15 DIAGNOSIS — Z79899 Other long term (current) drug therapy: Secondary | ICD-10-CM | POA: Insufficient documentation

## 2021-04-15 DIAGNOSIS — E669 Obesity, unspecified: Secondary | ICD-10-CM

## 2021-04-15 DIAGNOSIS — F17200 Nicotine dependence, unspecified, uncomplicated: Secondary | ICD-10-CM | POA: Diagnosis present

## 2021-04-15 DIAGNOSIS — Z720 Tobacco use: Secondary | ICD-10-CM | POA: Diagnosis not present

## 2021-04-15 DIAGNOSIS — J9811 Atelectasis: Secondary | ICD-10-CM | POA: Diagnosis not present

## 2021-04-15 DIAGNOSIS — R0602 Shortness of breath: Secondary | ICD-10-CM | POA: Diagnosis not present

## 2021-04-15 DIAGNOSIS — I10 Essential (primary) hypertension: Secondary | ICD-10-CM | POA: Diagnosis present

## 2021-04-15 DIAGNOSIS — K449 Diaphragmatic hernia without obstruction or gangrene: Secondary | ICD-10-CM | POA: Diagnosis not present

## 2021-04-15 LAB — TROPONIN I (HIGH SENSITIVITY)
Troponin I (High Sensitivity): 14 ng/L (ref <18)
Troponin I (High Sensitivity): 11 ng/L (ref <18)

## 2021-04-15 LAB — TSH: TSH: 1.108 u[IU]/mL (ref 0.350–4.500)

## 2021-04-15 LAB — CBC WITH DIFFERENTIAL/PLATELET
Abs Immature Granulocytes: 0.03 10*3/uL (ref 0.00–0.07)
Basophils Absolute: 0.1 10*3/uL (ref 0.0–0.1)
Basophils Relative: 1 %
Eosinophils Absolute: 0.1 10*3/uL (ref 0.0–0.5)
Eosinophils Relative: 1 %
HCT: 45.8 % (ref 36.0–46.0)
Hemoglobin: 15.9 g/dL — ABNORMAL HIGH (ref 12.0–15.0)
Immature Granulocytes: 0 %
Lymphocytes Relative: 27 %
Lymphs Abs: 2.5 10*3/uL (ref 0.7–4.0)
MCH: 32.9 pg (ref 26.0–34.0)
MCHC: 34.7 g/dL (ref 30.0–36.0)
MCV: 94.8 fL (ref 80.0–100.0)
Monocytes Absolute: 0.5 10*3/uL (ref 0.1–1.0)
Monocytes Relative: 5 %
Neutro Abs: 6 10*3/uL (ref 1.7–7.7)
Neutrophils Relative %: 66 %
Platelets: 276 10*3/uL (ref 150–400)
RBC: 4.83 MIL/uL (ref 3.87–5.11)
RDW: 12.9 % (ref 11.5–15.5)
WBC: 9.2 10*3/uL (ref 4.0–10.5)
nRBC: 0 % (ref 0.0–0.2)

## 2021-04-15 LAB — BASIC METABOLIC PANEL
Anion gap: 12 (ref 5–15)
BUN: 20 mg/dL (ref 8–23)
CO2: 27 mmol/L (ref 22–32)
Calcium: 9.2 mg/dL (ref 8.9–10.3)
Chloride: 99 mmol/L (ref 98–111)
Creatinine, Ser: 1.26 mg/dL — ABNORMAL HIGH (ref 0.44–1.00)
GFR, Estimated: 45 mL/min — ABNORMAL LOW (ref >60)
Glucose, Bld: 110 mg/dL — ABNORMAL HIGH (ref 70–99)
Potassium: 3.6 mmol/L (ref 3.5–5.1)
Sodium: 138 mmol/L (ref 135–145)

## 2021-04-15 LAB — BRAIN NATRIURETIC PEPTIDE: B Natriuretic Peptide: 432.4 pg/mL — ABNORMAL HIGH (ref 0.0–100.0)

## 2021-04-15 LAB — RESP PANEL BY RT-PCR (FLU A&B, COVID) ARPGX2
Influenza A by PCR: NEGATIVE
Influenza B by PCR: NEGATIVE
SARS Coronavirus 2 by RT PCR: NEGATIVE

## 2021-04-15 LAB — PHOSPHORUS: Phosphorus: 2.5 mg/dL (ref 2.5–4.6)

## 2021-04-15 LAB — T4, FREE: Free T4: 1.36 ng/dL — ABNORMAL HIGH (ref 0.61–1.12)

## 2021-04-15 LAB — MAGNESIUM: Magnesium: 2.1 mg/dL (ref 1.7–2.4)

## 2021-04-15 MED ORDER — IOHEXOL 350 MG/ML SOLN
50.0000 mL | Freq: Once | INTRAVENOUS | Status: AC | PRN
  Administered 2021-04-15: 50 mL via INTRAVENOUS

## 2021-04-15 MED ORDER — METOPROLOL TARTRATE 25 MG PO TABS
25.0000 mg | ORAL_TABLET | Freq: Two times a day (BID) | ORAL | Status: DC
  Administered 2021-04-15 – 2021-04-17 (×4): 25 mg via ORAL
  Filled 2021-04-15 (×4): qty 1

## 2021-04-15 MED ORDER — VARENICLINE TARTRATE 0.5 MG PO TABS
0.5000 mg | ORAL_TABLET | Freq: Two times a day (BID) | ORAL | Status: DC
  Administered 2021-04-15 – 2021-04-17 (×4): 0.5 mg via ORAL
  Filled 2021-04-15 (×6): qty 1

## 2021-04-15 MED ORDER — PANTOPRAZOLE SODIUM 40 MG IV SOLR
40.0000 mg | Freq: Two times a day (BID) | INTRAVENOUS | Status: DC
  Administered 2021-04-15 – 2021-04-16 (×3): 40 mg via INTRAVENOUS
  Filled 2021-04-15 (×3): qty 40

## 2021-04-15 MED ORDER — DILTIAZEM HCL-DEXTROSE 125-5 MG/125ML-% IV SOLN (PREMIX)
5.0000 mg/h | INTRAVENOUS | Status: DC
  Administered 2021-04-15: 5 mg/h via INTRAVENOUS
  Filled 2021-04-15: qty 125

## 2021-04-15 MED ORDER — DILTIAZEM HCL 30 MG PO TABS
30.0000 mg | ORAL_TABLET | Freq: Four times a day (QID) | ORAL | Status: DC
  Administered 2021-04-15 – 2021-04-17 (×6): 30 mg via ORAL
  Filled 2021-04-15 (×6): qty 1

## 2021-04-15 MED ORDER — SERTRALINE HCL 50 MG PO TABS
50.0000 mg | ORAL_TABLET | Freq: Every day | ORAL | Status: DC
  Administered 2021-04-16 – 2021-04-17 (×2): 50 mg via ORAL
  Filled 2021-04-15 (×2): qty 1

## 2021-04-15 MED ORDER — APIXABAN 5 MG PO TABS
5.0000 mg | ORAL_TABLET | Freq: Two times a day (BID) | ORAL | Status: DC
  Administered 2021-04-15 – 2021-04-17 (×4): 5 mg via ORAL
  Filled 2021-04-15 (×4): qty 1

## 2021-04-15 MED ORDER — DILTIAZEM HCL 60 MG PO TABS
30.0000 mg | ORAL_TABLET | Freq: Once | ORAL | Status: AC
  Administered 2021-04-15: 30 mg via ORAL
  Filled 2021-04-15: qty 1

## 2021-04-15 NOTE — H&P (Signed)
History and Physical    Stephanie [name] [id number] DOB: 01-20-1951 DOA: 04/15/2021  PCP: Tracie Harrier, MD    Patient coming from:  Home   Chief Complaint:  Atrial fibrillation with a rapid ventricular response.   HPI: Stephanie [name] is a 70 y.o. female seen in ed with complaints of palpitations and feeling like her A. fib is acting up again and patient is dyspneic in triage.  Patient has a history of atrial fibrillation and is on metoprolol and Eliquis and has been having palpitations and dizziness along with shortness of breath and chest discomfort for the past few days, patient also reports some myalgias and feels her A. fib and she is dizzy before it comes on.worsening sob and palpitation and  a.fib rvr 160 on initial arrival . Case d/w dr. Clayborn Bigness and po trial didn ot work as pt became dizzy.   Pt has past medical history of chronic A. fib, tobacco abuse, GERD, chronic diarrhea, essential hypertension.  ED Course:  Vitals:   04/15/21 1630 04/15/21 1700 04/15/21 1830 04/15/21 1914  BP: 117/84 111/90 103/77 120/77  Pulse:   74 91  Resp: $Remo'11 17 15 16  'JKQhB$ Temp:      TempSrc:      SpO2:   97% 98%  Weight:      Height:      In the emergency room patient is alert awake oriented afebrile, oxygenating normal O2 sats in the high 90s and patient was started on  diltiazem drip in the emergency room. CTA negative for PE. Pt has remained stable po diltiazem at rest. Labs shows CMP with a creatinine of 1.26 Lab Results  Component Value Date   CREATININE 1.26 (H) 04/15/2021   CREATININE 1.06 (H) 05/11/2018   CREATININE 0.95 05/02/2018  Patient has mild kidney dysfunction, EGFR 45, BNP of 432.4, normal CBC glucose of 110.   Review of Systems:  Review of Systems  Respiratory:  Positive for shortness of breath.   Cardiovascular:  Positive for palpitations.  All other systems reviewed and are negative.   Past Medical History:  Diagnosis Date   A-fib [facility])    Arthritis     Chronic diarrhea    Diverticulosis    Dysrhythmia    ATRIAL FIB   Hyperlipidemia    Hypertension    Restless leg     Past Surgical History:  Procedure Laterality Date   CARDIOVERSION N/A 05/12/2018   Procedure: CARDIOVERSION;  Surgeon: Corey Skains, MD;  Location: [facility] ORS;  Service: Cardiovascular;  Laterality: N/A;   CHOLECYSTECTOMY  2004   COLONOSCOPY WITH PROPOFOL N/A 05/24/2019   Procedure: COLONOSCOPY WITH PROPOFOL;  Surgeon: Toledo, Benay Pike, MD;  Location: [facility] ENDOSCOPY;  Service: Gastroenterology;  Laterality: N/A;   ESOPHAGOGASTRODUODENOSCOPY (EGD) WITH PROPOFOL N/A 05/24/2019   Procedure: ESOPHAGOGASTRODUODENOSCOPY (EGD) WITH PROPOFOL;  Surgeon: Toledo, Benay Pike, MD;  Location: [facility] ENDOSCOPY;  Service: Gastroenterology;  Laterality: N/A;   KNEE SURGERY     KNEE SURGERY     TEE WITHOUT CARDIOVERSION N/A 05/12/2018   Procedure: TRANSESOPHAGEAL ECHOCARDIOGRAM (TEE);  Surgeon: Corey Skains, MD;  Location: [facility] ORS;  Service: Cardiovascular;  Laterality: N/A;   TOE SURGERY       reports that she has quit smoking. Her smoking use included cigarettes. She has a 40.00 pack-year smoking history. She has never used smokeless tobacco. She reports previous alcohol use. She reports that she does not use drugs.  Allergies  Allergen Reactions   Codeine Other (See Comments)  Cannot tolerate alone, only when mixed with another medication.   Lactose Intolerance (Gi)    Phenazopyridine Hcl Hives    Family History  Family history unknown: Yes    Prior to Admission medications   Medication Sig Start Date End Date Taking? Authorizing Provider  calcium citrate-vitamin D (CITRACAL+D) 315-200 MG-UNIT per tablet Take 1 tablet by mouth daily.    Yes [provider]  cholecalciferol (VITAMIN D) 1000 units tablet Take 1,000 Units by mouth daily.    Yes [provider]  dicyclomine (BENTYL) 20 MG tablet Take 20 mg by mouth 3 (three) times daily. 02/06/21  Yes  [provider]  ELIQUIS 5 MG TABS tablet Take 5 mg by mouth 2 (two) times daily. 05/03/18  Yes [provider]  hydrochlorothiazide (HYDRODIURIL) 25 MG tablet Take 25 mg by mouth daily. 03/07/21  Yes [provider]  metoprolol succinate (TOPROL-XL) 50 MG 24 hr tablet Take 50 mg by mouth 2 (two) times daily. 05/03/18  Yes [provider]  sertraline (ZOLOFT) 50 MG tablet Take 50 mg by mouth daily.   Yes [provider]  varenicline (CHANTIX) 0.5 MG tablet Take 0.5 mg by mouth 2 (two) times daily. 02/20/21  Yes [provider]  colestipol (COLESTID) 1 g tablet Take 1 g by mouth daily before lunch. Patient not taking: No sig reported    [provider]  Cyanocobalamin (VITAMIN B-12 PO) Take 1,000 mcg by mouth daily.  Patient not taking: Reported on 04/15/2021    [provider]    Physical Exam: Vitals:   04/15/21 1630 04/15/21 1700 04/15/21 1830 04/15/21 1914  BP: 117/84 111/90 103/77 120/77  Pulse:   74 91  Resp: $Remo'11 17 15 16  'qcYTM$ Temp:      TempSrc:      SpO2:   97% 98%  Weight:      Height:       Physical Exam Vitals and nursing note reviewed.  Constitutional:      Appearance: Normal appearance. She is not ill-appearing.  HENT:     Head: Normocephalic and atraumatic.     Right Ear: External ear normal.     Left Ear: External ear normal.     Nose: Nose normal.  Eyes:     Extraocular Movements: Extraocular movements intact.     Pupils: Pupils are equal, round, and reactive to light.  Neck:     Vascular: No carotid bruit.  Cardiovascular:     Rate and Rhythm: Normal rate. Rhythm irregular.     Pulses: Normal pulses.     Heart sounds: Normal heart sounds.  Pulmonary:     Effort: Pulmonary effort is normal.     Breath sounds: Normal breath sounds.  Abdominal:     General: Bowel sounds are normal. There is no distension.     Palpations: Abdomen is soft. There is no mass.     Tenderness: There is no abdominal  tenderness. There is no guarding.     Hernia: No hernia is present.  Musculoskeletal:     Right lower leg: No edema.     Left lower leg: No edema.  Skin:    General: Skin is warm.  Neurological:     General: No focal deficit present.     Mental Status: She is alert and oriented to person, place, and time.  Psychiatric:        Mood and Affect: Mood normal.        Behavior: Behavior normal.  Labs on Admission: I have personally reviewed following labs and imaging studies  No results for input(s): CKTOTAL, CKMB, TROPONINI in the last 72 hours. Lab Results  Component Value Date   WBC 9.2 04/15/2021   HGB 15.9 (H) 04/15/2021   HCT 45.8 04/15/2021   MCV 94.8 04/15/2021   PLT 276 04/15/2021    Recent Labs  Lab 04/15/21 1301  NA 138  K 3.6  CL 99  CO2 27  BUN 20  CREATININE 1.26*  CALCIUM 9.2  GLUCOSE 110*   Lab Results  Component Value Date   CHOL 142 09/07/2017   HDL 51.10 09/07/2017   LDLCALC 74 09/07/2017   TRIG 85.0 09/07/2017   No results found for: DDIMER Invalid input(s): POCBNP  Urinalysis No results found for: COLORURINE, APPEARANCEUR, LABSPEC, PHURINE, GLUCOSEU, HGBUR, BILIRUBINUR, KETONESUR, PROTEINUR, UROBILINOGEN, NITRITE, LEUKOCYTESUR    COVID-19 Labs No results for input(s): DDIMER, FERRITIN, LDH, CRP in the last 72 hours. Lab Results  Component Value Date   [place] NEGATIVE 04/15/2021   [place] NEGATIVE 05/19/2019    Radiological Exams on Admission: CT Angio Chest PE W and/or Wo Contrast  Result Date: 04/15/2021 CLINICAL DATA:  Shortness of breath. Evaluate for pulmonary embolism. EXAM: CT ANGIOGRAPHY CHEST WITH CONTRAST TECHNIQUE: Multidetector CT imaging of the chest was performed using the standard protocol during bolus administration of intravenous contrast. Multiplanar CT image reconstructions and MIPs were obtained to evaluate the vascular anatomy. CONTRAST:  56mL OMNIPAQUE IOHEXOL 350 MG/ML SOLN COMPARISON:  10/27/2017;  10/14/2016 FINDINGS: Vascular Findings: There is adequate opacification of the pulmonary arterial system with the main pulmonary artery measuring 414 Hounsfield units. There are no discrete filling defects within the pulmonary arterial tree to suggest pulmonary embolism. Normal caliber the main pulmonary artery. Cardiomegaly. In particular, there is enlargement of the left atrium and ventricle. No pericardial effusion. No evidence of thoracic aortic aneurysm. Scattered atherosclerotic plaque within the aortic arch and descending thoracic aorta. Conventional configuration of the aortic arch. Review of the MIP images confirms the above findings. ---------------------------------------------------------------------------------- Nonvascular Findings: Mediastinum/Lymph Nodes: Solitary mildly prominent though non pathologically enlarged AP window lymph node measures 0.6 cm in greatest short axis diameter (image 34, series 4), similar to the 10/2017 examination and thus favored to be reactive in etiology. No bulky mediastinal, hilar axillary lymphadenopathy. Lungs/Pleura: Minimal dependent subpleural ground-glass atelectasis. No discrete focal airspace opacities. No pleural effusion or pneumothorax. The central pulmonary airways appear widely patent. No discrete pulmonary nodules. Upper abdomen: Limited early arterial phase evaluation of the upper abdomen demonstrates mild prominence of the CBD, likely the sequela of biliary reservoir phenomena in the setting of post cholecystectomy state. Note is made of a small hiatal hernia and a splenule about the anterior tip of the spleen. Musculoskeletal: No acute or aggressive osseous abnormalities. 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 Note is made of an approximately 2.0 x 1.5 cm nodule within the imaged posteroinferior aspect the left lobe of the thyroid (image 3, series 4) suboptimally evaluated on previous noncontrast CTs. IMPRESSION: 1. No  acute cardiopulmonary disease. Specifically, no evidence of pulmonary embolism. 2. Cardiomegaly, in particular, there is enlargement of the left atrium and ventricle. Further evaluation with cardiac echo could be performed as clinically indicated. 3. Incidental note made of an approximately 2.0 cm nodule within the imaged caudal posterior aspect the left lobe of the thyroid, suboptimally evaluated on previous noncontrast chest CTs. Further evaluation with nonemergent dedicated thyroid ultrasound could be performed  as indicated. 4.  Aortic Atherosclerosis (ICD10-I70.0). 5. Small hiatal hernia. Electronically Signed   By: Sandi Mariscal M.D.   On: 04/15/2021 15:36   DG Chest Portable 1 View  Result Date: 04/15/2021 CLINICAL DATA:  Shortness of breath, atrial fibrillation EXAM: PORTABLE CHEST 1 VIEW COMPARISON:  05/02/2018 FINDINGS: The heart size and mediastinal contours are within normal limits. Both lungs are clear. The visualized skeletal structures are unremarkable except for degenerative changes noted spine. Trachea midline. Aorta atherosclerotic. IMPRESSION: No active disease. Electronically Signed   By: Jerilynn Mages.  Shick M.D.   On: 04/15/2021 14:37    EKG: Independently reviewed.  A. fib RVR at 160. Echocardiogram: Echocardiogram done on 10/09/8675 shows systolic function normal with an ejection fraction of 55 to 60% with impression of normal study.   Assessment/Plan Principal Problem:   Atrial fibrillation with rapid ventricular response (HCC) Active Problems:   Essential hypertension   Tobacco abuse   Obesity   A.fib rvr: -admit to PCU with telemetry.  -cont po diltiazem and metoprolol.  -cont eliquis.  - 2 d echocardiogram/ tft/ electrolytes   HTN: -Blood pressure 120/77, pulse 91, temperature 98 F (36.7 C), temperature source Oral, resp. rate 16, height $RemoveBe'5\' 5"'ZDPrMkxcH$  (1.651 m), weight 85.7 kg, SpO2 98 %. -Hold hctz cont metoprolol and diltiazem .  Tobacco abuse: - congratulated pt on quitting  for past two months.  - pt still on chantix, does have bad dreams.   Obesity: -We will check a1c .  Thyroid nodule: -advised pt to f/u with endocrinologist for usg and workup.     DVT prophylaxis:  Eliquis  Code Status:  Full code  Family Communication:  Combs,Elizabeth (Granddaughter)  [phone] (Home Phone)   Disposition Plan:  Home  Consults called:  Dr. Clayborn Bigness cardiology  Admission status: Observation   Para Skeans MD [facility] [phone] How to contact the [facility] Attending or Consulting provider [place] or covering provider during after hours [place], for this patient.    Check the care team in [facility] and look for a) attending/consulting [facility] provider listed and b) the [facility] team listed Log into [email or web address] and use [place]'s universal password to access. If you do not have the password, please contact the hospital operator. Locate the [facility] provider you are looking for under [facility] and page to a number that you can be directly reached. If you still have difficulty reaching the provider, please page the [facility] (Director on Call) for the Hospitalists listed on amion for assistance. [email or web address] Password [email or web address] 04/15/2021, 8:10 PM

## 2021-04-15 NOTE — ED Provider Notes (Signed)
[facility] Emergency Department Provider Note ____________________________________________   Event Date/Time   First MD Initiated Contact with Patient 04/15/21 1308     (approximate)  I have reviewed the triage vital signs and the nursing notes.   HISTORY  Chief Complaint Shortness of Breath    HPI Tomica Mondo is a 70 y.o. female with PMH as noted below including history of atrial fibrillation on metoprolol and Eliquis who presents with palpitations over the last few days, persistent course, and associated with chest discomfort, lightheadedness, and shortness of breath.  The patient also reports some generalized body aches.  She states that the symptoms feel similar to prior episodes of rapid atrial fibrillation.  She states that she has been compliant with her medication but that the rapid atrial fibrillation has not been corrected by her normal metoprolol.  The patient also reports some tingling in both of her legs but no specific leg swelling on one side or the other.  She has had a DVT in the past.   Past Medical History:  Diagnosis Date   A-fib ([place])    Arthritis    Chronic diarrhea    Diverticulosis    Dysrhythmia    ATRIAL FIB   Hyperlipidemia    Hypertension    Restless leg     Patient Active Problem List   Diagnosis Date Noted   Heel pain 05/25/2018   Chronic diarrhea 01/24/2018   Near syncope 11/16/2017   Medicare annual wellness visit, subsequent 09/07/2017   Screening for hyperlipidemia 09/07/2017   Vitamin D deficiency 09/07/2017   Personal history of tobacco use, presenting hazards to health 10/15/2016   Chronic knee pain 06/08/2016   Medicare annual wellness visit, initial 08/23/2015   Restless leg syndrome 08/23/2015   Essential hypertension 05/24/2015   Anxiety and depression 05/24/2015   Tobacco abuse 05/24/2015   Atrial fibrillation ([place]) 05/24/2015    Past Surgical History:  Procedure Laterality Date    CARDIOVERSION N/A 05/12/2018   Procedure: CARDIOVERSION;  Surgeon: Corey Skains, MD;  Location: [facility] ORS;  Service: Cardiovascular;  Laterality: N/A;   CHOLECYSTECTOMY  2004   COLONOSCOPY WITH PROPOFOL N/A 05/24/2019   Procedure: COLONOSCOPY WITH PROPOFOL;  Surgeon: Toledo, Benay Pike, MD;  Location: [facility] ENDOSCOPY;  Service: Gastroenterology;  Laterality: N/A;   ESOPHAGOGASTRODUODENOSCOPY (EGD) WITH PROPOFOL N/A 05/24/2019   Procedure: ESOPHAGOGASTRODUODENOSCOPY (EGD) WITH PROPOFOL;  Surgeon: Toledo, Benay Pike, MD;  Location: [facility] ENDOSCOPY;  Service: Gastroenterology;  Laterality: N/A;   KNEE SURGERY     KNEE SURGERY     TEE WITHOUT CARDIOVERSION N/A 05/12/2018   Procedure: TRANSESOPHAGEAL ECHOCARDIOGRAM (TEE);  Surgeon: Corey Skains, MD;  Location: [facility] ORS;  Service: Cardiovascular;  Laterality: N/A;   TOE SURGERY      Prior to Admission medications   Medication Sig Start Date End Date Taking? Authorizing Provider  calcium citrate-vitamin D (CITRACAL+D) 315-200 MG-UNIT per tablet Take 1 tablet by mouth daily.    Yes [provider]  cholecalciferol (VITAMIN D) 1000 units tablet Take 1,000 Units by mouth daily.    Yes [provider]  dicyclomine (BENTYL) 20 MG tablet Take 20 mg by mouth 3 (three) times daily. 02/06/21  Yes [provider]  ELIQUIS 5 MG TABS tablet Take 5 mg by mouth 2 (two) times daily. 05/03/18  Yes [provider]  hydrochlorothiazide (HYDRODIURIL) 25 MG tablet Take 25 mg by mouth daily. 03/07/21  Yes [provider]  metoprolol succinate (TOPROL-XL) 50 MG 24  hr tablet Take 50 mg by mouth 2 (two) times daily. 05/03/18  Yes [provider]  sertraline (ZOLOFT) 50 MG tablet Take 50 mg by mouth daily.   Yes [provider]  varenicline (CHANTIX) 0.5 MG tablet Take 0.5 mg by mouth 2 (two) times daily. 02/20/21  Yes [provider]  colestipol (COLESTID) 1 g tablet Take 1 g by mouth daily before  lunch. Patient not taking: No sig reported    [provider]  Cyanocobalamin (VITAMIN B-12 PO) Take 1,000 mcg by mouth daily.  Patient not taking: Reported on 04/15/2021    [provider]    Allergies Codeine, Lactose intolerance (gi), and Phenazopyridine hcl  No family history on file.  Social History Social History   Tobacco Use   Smoking status: Former    Packs/day: 1.00    Years: 40.00    Pack years: 40.00    Types: Cigarettes   Smokeless tobacco: Never  Vaping Use   Vaping Use: Never used  Substance Use Topics   Alcohol use: Not Currently    Comment: social   Drug use: Never    Review of Systems  Constitutional: No fever/chills Eyes: No visual changes. ENT: No sore throat. Cardiovascular: Positive for chest pain. Respiratory: Positive for shortness of breath. Gastrointestinal: No vomiting or diarrhea.  Genitourinary: Negative for flank pain. Musculoskeletal: Negative for back pain. Skin: Negative for rash. Neurological: Negative for headaches, focal weakness or numbness.   ____________________________________________   PHYSICAL EXAM:  VITAL SIGNS: ED Triage Vitals  Enc Vitals Group     BP 04/15/21 1258 (!) 140/118     Pulse Rate 04/15/21 1248 (!) 150     Resp 04/15/21 1248 (!) 26     Temp 04/15/21 1248 98 F (36.7 C)     Temp Source 04/15/21 1248 Oral     SpO2 04/15/21 1248 99 %     Weight 04/15/21 1249 189 lb (85.7 kg)     Height 04/15/21 1249 '5\' 5"'$  (1.651 m)     Head Circumference --      Peak Flow --      Pain Score 04/15/21 1249 0     Pain Loc --      Pain Edu? --      Excl. in [place]? --     Constitutional: Alert and oriented.  Relatively well appearing and in no acute distress. Eyes: Conjunctivae are normal.  Head: Atraumatic. Nose: No congestion/rhinnorhea. Mouth/Throat: Mucous membranes are moist.   Neck: Normal range of motion.  Cardiovascular: Tachycardic, irregular rhythm. Good peripheral  circulation. Respiratory: Normal respiratory effort.  No retractions. Gastrointestinal: No distention.  Musculoskeletal: No lower extremity edema.  Extremities warm and well perfused.  Neurologic:  Normal speech and language. No gross focal neurologic deficits are appreciated.  Skin:  Skin is warm and dry. No rash noted. Psychiatric: Mood and affect are normal. Speech and behavior are normal.  ____________________________________________   LABS (all labs ordered are listed, but only abnormal results are displayed)  Labs Reviewed  BASIC METABOLIC PANEL - Abnormal; Notable for the following components:      Result Value   Glucose, Bld 110 (*)    Creatinine, Ser 1.26 (*)    GFR, Estimated 45 (*)    All other components within normal limits  CBC WITH DIFFERENTIAL/PLATELET - Abnormal; Notable for the following components:   Hemoglobin 15.9 (*)    All other components within normal limits  BRAIN NATRIURETIC PEPTIDE - Abnormal; Notable for  the following components:   B Natriuretic Peptide 432.4 (*)    All other components within normal limits  RESP PANEL BY RT-PCR (FLU A&B, COVID) ARPGX2  TROPONIN I (HIGH SENSITIVITY)  TROPONIN I (HIGH SENSITIVITY)   ____________________________________________  EKG  ED ECG REPORT I, Arta Silence, the attending physician, personally viewed and interpreted this ECG.  Date: 04/15/2021 EKG Time: 1246 Rate: 150 Rhythm: Atrial fibrillation with RVR QRS Axis: normal Intervals: normal ST/T Wave abnormalities: Nonspecific ST abnormalities Narrative Interpretation: Atrial fibrillation with RVR; no evidence of acute ischemia  ____________________________________________  RADIOLOGY  Chest x-ray interpreted by me shows no focal consolidation or edema CT angio chest: Pending  ____________________________________________   PROCEDURES  Procedure(s) performed: No  Procedures  Critical Care performed:  No ____________________________________________   INITIAL IMPRESSION / ASSESSMENT AND PLAN / ED COURSE  Pertinent labs & imaging results that were available during my care of the patient were reviewed by me and considered in my medical decision making (see chart for details).   70 year old female with PMH as noted above including atrial fibrillation on Eliquis and metoprolol presents with palpitations, chest discomfort, shortness of breath, and lightheadedness over the last few days which she associates with rapid atrial fibrillation.  I reviewed the past medical records in [place].  The patient was most recently admitted in 2019 with rapid atrial fibrillation treated with IV Cardizem and ultimately with cardioversion.  On exam today, the patient is well-appearing.  Her vital signs are normal except for a heart rate in the 150s.  Physical exam is otherwise relatively unremarkable.  The patient has no peripheral edema.  EKG shows atrial fibrillation with RVR.  The precipitating cause is unclear given that the patient appears to have been compliant with her medications.  We will start her on IV Cardizem and obtain chest x-ray and lab work-up.  We will also obtain a CT angio of the chest given the patient's history of DVT in the presence of chest pain and new onset atrial fibrillation, to rule out PE.  ----------------------------------------- 3:50 PM on 04/15/2021 -----------------------------------------  The patient's rate is well controlled on IV Cardizem.  She is feeling significantly better.  Lab work-up so far is reassuring with a normal troponin and minimally elevated BNP.  There is no evidence of acute CHF.  I consulted Dr. Clayborn Bigness from cardiology who recommends giving the patient p.o. Cardizem or metoprolol and pausing the IV infusion to see if the patient can be rate controlled off of the infusion.  He advises that if the patient can be rate controlled she may not need admission.  The CT  chest is also pending.  I signed the patient out to the oncoming ED physician Dr. Kerman Passey.   ____________________________________________   FINAL CLINICAL IMPRESSION(S) / ED DIAGNOSES  Final diagnoses:  Atrial fibrillation with rapid ventricular response ([place])      NEW MEDICATIONS STARTED DURING THIS VISIT:  New Prescriptions   No medications on file     Note:  This document was prepared using Dragon voice recognition software and may include unintentional dictation errors.    Arta Silence, MD 04/15/21 [phone]

## 2021-04-15 NOTE — ED Notes (Signed)
Patient is requesting something to eat, MD notified and is ok with pt eating. States daughter is going to bring her something. Ginger ale provided.

## 2021-04-15 NOTE — ED Notes (Signed)
Pt up out of bed to walk , Heart rate back up to 150 and pt dizzy , pt back to bed heart back down to 91

## 2021-04-15 NOTE — ED Triage Notes (Signed)
Pt c/o increased SOB over the past couple of days and feels like her a-fib is acting up. Pt is tachypnic in triage'

## 2021-04-15 NOTE — ED Provider Notes (Signed)
-----------------------------------------   6:00 PM on 04/15/2021 ----------------------------------------- Patient's heart rate has been controlled between 80 and 110 bpm off the diltiazem infusion.  Patient states she feels fine as long as she is lying still however feels dizzy if she sits up.  We attempted to ambulate the patient she became very dizzy and heart rate increased once again greater than 150 bpm consistent with atrial fibrillation with rapid ventricular response.  After several minutes of resting her heart rate improved back to around 90 to 100 bpm but she remained short of breath.  CTA of the chest is negative for PE.  However given the patient's persistent atrial fibrillation with intermittent RVR with minimal exertion we will admit to the hospital service for further work-up and treatment.   Harvest Dark, MD 04/15/21 [phone]

## 2021-04-16 ENCOUNTER — Observation Stay
Admit: 2021-04-16 | Discharge: 2021-04-16 | Disposition: A | Discharge status: Home / Self Care | Payer: Medicare HMO | Attending: Internal Medicine | Admitting: Internal Medicine

## 2021-04-16 DIAGNOSIS — I4891 Unspecified atrial fibrillation: Secondary | ICD-10-CM | POA: Diagnosis not present

## 2021-04-16 LAB — COMPREHENSIVE METABOLIC PANEL
ALT: 12 U/L (ref 0–44)
AST: 16 U/L (ref 15–41)
Albumin: 3.1 g/dL — ABNORMAL LOW (ref 3.5–5.0)
Alkaline Phosphatase: 56 U/L (ref 38–126)
Anion gap: 11 (ref 5–15)
BUN: 21 mg/dL (ref 8–23)
CO2: 28 mmol/L (ref 22–32)
Calcium: 8.4 mg/dL — ABNORMAL LOW (ref 8.9–10.3)
Chloride: 100 mmol/L (ref 98–111)
Creatinine, Ser: 1.03 mg/dL — ABNORMAL HIGH (ref 0.44–1.00)
GFR, Estimated: 58 mL/min — ABNORMAL LOW (ref >60)
Glucose, Bld: 106 mg/dL — ABNORMAL HIGH (ref 70–99)
Potassium: 3.3 mmol/L — ABNORMAL LOW (ref 3.5–5.1)
Sodium: 139 mmol/L (ref 135–145)
Total Bilirubin: 0.7 mg/dL (ref 0.3–1.2)
Total Protein: 6.3 g/dL — ABNORMAL LOW (ref 6.5–8.1)

## 2021-04-16 LAB — CBC
HCT: 39.6 % (ref 36.0–46.0)
Hemoglobin: 13.6 g/dL (ref 12.0–15.0)
MCH: 32.4 pg (ref 26.0–34.0)
MCHC: 34.3 g/dL (ref 30.0–36.0)
MCV: 94.3 fL (ref 80.0–100.0)
Platelets: 204 10*3/uL (ref 150–400)
RBC: 4.2 MIL/uL (ref 3.87–5.11)
RDW: 13 % (ref 11.5–15.5)
WBC: 7 10*3/uL (ref 4.0–10.5)
nRBC: 0 % (ref 0.0–0.2)

## 2021-04-16 LAB — ECHOCARDIOGRAM COMPLETE
AR max vel: 2.34 cm2
AV Area VTI: 2.88 cm2
AV Area mean vel: 2.5 cm2
AV Mean grad: 1 mmHg
AV Peak grad: 1.6 mmHg
Ao pk vel: 0.64 m/s
Area-P 1/2: 3.97 cm2
Height: 65 in
S' Lateral: 3.3 cm
Weight: 3024 oz

## 2021-04-16 MED ORDER — FUROSEMIDE 10 MG/ML IJ SOLN
20.0000 mg | Freq: Once | INTRAMUSCULAR | Status: AC
  Administered 2021-04-16: 20 mg via INTRAVENOUS
  Filled 2021-04-16: qty 2

## 2021-04-16 MED ORDER — ACETAMINOPHEN 325 MG PO TABS
650.0000 mg | ORAL_TABLET | Freq: Four times a day (QID) | ORAL | Status: DC | PRN
  Administered 2021-04-16: 650 mg via ORAL
  Filled 2021-04-16: qty 2

## 2021-04-16 NOTE — ED Notes (Signed)
Kayla RN aware of assigned bed 

## 2021-04-16 NOTE — Progress Notes (Signed)
[facility] NAME: Stephanie [name]    MR#:  [id number]  DATE OF BIRTH:  08-21-1951  SUBJECTIVE:   daughter in the room. Patient seen in the emergency room. She came in with increasing dizziness just overall not feeling well, fatigue ability and lack of energy. She was also having some shortness of breath. Found to be in a fib with RVR. Currently heart rate is 60-70 and increases up to 100s with exertion walking to the bathroom. Her sats remain more than 92% however patient is a mouth breather and occasionally dips to 87%. No leg swelling no weight gain complains of chest heaviness with palpitation REVIEW OF SYSTEMS:   Review of Systems  Constitutional:  Negative for chills, fever and weight loss.  HENT:  Negative for ear discharge, ear pain and nosebleeds.   Eyes:  Negative for blurred vision, pain and discharge.  Respiratory:  Positive for shortness of breath. Negative for sputum production, wheezing and stridor.   Cardiovascular:  Positive for palpitations. Negative for chest pain, orthopnea and PND.  Gastrointestinal:  Negative for abdominal pain, diarrhea, nausea and vomiting.  Genitourinary:  Negative for frequency and urgency.  Musculoskeletal:  Negative for back pain and joint pain.  Neurological:  Positive for dizziness and weakness. Negative for sensory change, speech change and focal weakness.  Psychiatric/Behavioral:  Negative for depression and hallucinations. The patient is not nervous/anxious.   Tolerating Diet:yes Tolerating PT:   DRUG ALLERGIES:   Allergies  Allergen Reactions   Codeine Other (See Comments)    Cannot tolerate alone, only when mixed with another medication.   Lactose Intolerance (Gi)    Phenazopyridine Hcl Hives    VITALS:  Blood pressure (!) 153/72, pulse 69, temperature 97.6 F (36.4 C), resp. rate 16, height '5\' 5"'$  (1.651 m), weight 85.7 kg, SpO2 100 %.  PHYSICAL EXAMINATION:   Physical  Exam  GENERAL:  70 y.o.-year-old patient lying in the bed with no acute distress.  LUNGS:  decreased breath sounds bilaterally, no wheezing, rales, rhonchi. No use of accessory muscles of respiration.  CARDIOVASCULAR: S1, S2 normal. No murmurs, rubs, or gallops.  ABDOMEN: Soft, nontender, nondistended. Bowel sounds present. No organomegaly or mass.  EXTREMITIES: No cyanosis, clubbing or edema b/l.    NEUROLOGIC: non-focal PSYCHIATRIC:  patient is alert and oriented x 3.  SKIN: No obvious rash, lesion, or ulcer.   LABORATORY PANEL:  CBC Recent Labs  Lab 04/16/21 0703  WBC 7.0  HGB 13.6  HCT 39.6  PLT 204    Chemistries  Recent Labs  Lab 04/15/21 1621 04/16/21 0703  NA  --  139  K  --  3.3*  CL  --  100  CO2  --  28  GLUCOSE  --  106*  BUN  --  21  CREATININE  --  1.03*  CALCIUM  --  8.4*  MG 2.1  --   AST  --  16  ALT  --  12  ALKPHOS  --  56  BILITOT  --  0.7   Cardiac Enzymes No results for input(s): TROPONINI in the last 168 hours. RADIOLOGY:  CT Angio Chest PE W and/or Wo Contrast  Result Date: 04/15/2021 CLINICAL DATA:  Shortness of breath. Evaluate for pulmonary embolism. EXAM: CT ANGIOGRAPHY CHEST WITH CONTRAST TECHNIQUE: Multidetector CT imaging of the chest was performed using the standard protocol during bolus administration of intravenous contrast. Multiplanar CT image reconstructions and MIPs were obtained  to evaluate the vascular anatomy. CONTRAST:  40m OMNIPAQUE IOHEXOL 350 MG/ML SOLN COMPARISON:  10/27/2017; 10/14/2016 FINDINGS: Vascular Findings: There is adequate opacification of the pulmonary arterial system with the main pulmonary artery measuring 414 Hounsfield units. There are no discrete filling defects within the pulmonary arterial tree to suggest pulmonary embolism. Normal caliber the main pulmonary artery. Cardiomegaly. In particular, there is enlargement of the left atrium and ventricle. No pericardial effusion. No evidence of thoracic aortic  aneurysm. Scattered atherosclerotic plaque within the aortic arch and descending thoracic aorta. Conventional configuration of the aortic arch. Review of the MIP images confirms the above findings. ---------------------------------------------------------------------------------- Nonvascular Findings: Mediastinum/Lymph Nodes: Solitary mildly prominent though non pathologically enlarged AP window lymph node measures 0.6 cm in greatest short axis diameter (image 34, series 4), similar to the 10/2017 examination and thus favored to be reactive in etiology. No bulky mediastinal, hilar axillary lymphadenopathy. Lungs/Pleura: Minimal dependent subpleural ground-glass atelectasis. No discrete focal airspace opacities. No pleural effusion or pneumothorax. The central pulmonary airways appear widely patent. No discrete pulmonary nodules. Upper abdomen: Limited early arterial phase evaluation of the upper abdomen demonstrates mild prominence of the CBD, likely the sequela of biliary reservoir phenomena in the setting of post cholecystectomy state. Note is made of a small hiatal hernia and a splenule about the anterior tip of the spleen. Musculoskeletal: No acute or aggressive osseous abnormalities. 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 Note is made of an approximately 2.0 x 1.5 cm nodule within the imaged posteroinferior aspect the left lobe of the thyroid (image 3, series 4) suboptimally evaluated on previous noncontrast CTs. IMPRESSION: 1. No acute cardiopulmonary disease. Specifically, no evidence of pulmonary embolism. 2. Cardiomegaly, in particular, there is enlargement of the left atrium and ventricle. Further evaluation with cardiac echo could be performed as clinically indicated. 3. Incidental note made of an approximately 2.0 cm nodule within the imaged caudal posterior aspect the left lobe of the thyroid, suboptimally evaluated on previous noncontrast chest CTs. Further evaluation  with nonemergent dedicated thyroid ultrasound could be performed as indicated. 4.  Aortic Atherosclerosis (ICD10-I70.0). 5. Small hiatal hernia. Electronically Signed   By: JSandi MariscalM.D.   On: 04/15/2021 15:36   DG Chest Portable 1 View  Result Date: 04/15/2021 CLINICAL DATA:  Shortness of breath, atrial fibrillation EXAM: PORTABLE CHEST 1 VIEW COMPARISON:  05/02/2018 FINDINGS: The heart size and mediastinal contours are within normal limits. Both lungs are clear. The visualized skeletal structures are unremarkable except for degenerative changes noted spine. Trachea midline. Aorta atherosclerotic. IMPRESSION: No active disease. Electronically Signed   By: MJerilynn Mages  Shick M.D.   On: 04/15/2021 14:37   ECHOCARDIOGRAM COMPLETE  Result Date: 04/16/2021    ECHOCARDIOGRAM REPORT   Patient Name:   Stephanie [name] Date of Exam: 04/16/2021 Medical Rec #:  [id number]     Height:       65.0 in Accession #:    [id number]    Weight:       189.0 lb Date of Birth:  [birth year]     BSA:          1.931 m Patient Age:    89years       BP:           116/52 mmHg Patient Gender: F              HR:           81 bpm. Exam  Location:  [facility] Procedure: 2D Echo, Cardiac Doppler and Color Doppler Indications:     Atrial fibrillation I48.91  History:         Patient has prior history of Echocardiogram examinations, most                  recent 05/12/2018. Arrythmias:Atrial Fibrillation; Risk                  Factors:Hypertension.  Sonographer:     Sherrie Sport RDCS (AE) Referring Phys:  [place] Diagnosing Phys: Yolonda Kida MD IMPRESSIONS  1. Basilar inferior hypo.  2. Left ventricular ejection fraction, by estimation, is 40 to 45%. The left ventricle has mildly decreased function. The left ventricle demonstrates regional wall motion abnormalities (see scoring diagram/findings for description). Left ventricular diastolic parameters were normal.  3. Right ventricular systolic function is mildly reduced. The right ventricular  size is moderately enlarged.  4. The mitral valve is normal in structure. No evidence of mitral valve regurgitation.  5. The aortic valve is normal in structure. Aortic valve regurgitation is not visualized. FINDINGS  Left Ventricle: Left ventricular ejection fraction, by estimation, is 40 to 45%. The left ventricle has mildly decreased function. The left ventricle demonstrates regional wall motion abnormalities. The left ventricular internal cavity size was normal in size. There is no left ventricular hypertrophy. Left ventricular diastolic parameters were normal. Right Ventricle: The right ventricular size is moderately enlarged. No increase in right ventricular wall thickness. Right ventricular systolic function is mildly reduced. Left Atrium: Left atrial size was normal in size. Right Atrium: Right atrial size was normal in size. Pericardium: There is no evidence of pericardial effusion. Mitral Valve: The mitral valve is normal in structure. No evidence of mitral valve regurgitation. Tricuspid Valve: The tricuspid valve is normal in structure. Tricuspid valve regurgitation is trivial. Aortic Valve: The aortic valve is normal in structure. Aortic valve regurgitation is not visualized. Aortic valve mean gradient measures 1.0 mmHg. Aortic valve peak gradient measures 1.6 mmHg. Aortic valve area, by VTI measures 2.88 cm. Pulmonic Valve: The pulmonic valve was grossly normal. Pulmonic valve regurgitation is not visualized. Aorta: The ascending aorta was not well visualized. IAS/Shunts: No atrial level shunt detected by color flow Doppler. Additional Comments: Basilar inferior hypo.  LEFT VENTRICLE PLAX 2D LVIDd:         4.10 cm LVIDs:         3.30 cm LV PW:         1.00 cm LV IVS:        0.95 cm LVOT diam:     2.00 cm LV SV:         29 LV SV Index:   15 LVOT Area:     3.14 cm  RIGHT VENTRICLE RV Basal diam:  3.50 cm LEFT ATRIUM           Index       RIGHT ATRIUM           Index LA diam:      3.00 cm 1.55 cm/m  RA  Area:     16.50 cm LA Vol (A2C): 26.0 ml 13.46 ml/m RA Volume:   43.40 ml  22.47 ml/m LA Vol (A4C): 33.1 ml 17.14 ml/m  AORTIC VALVE                   PULMONIC VALVE AV Area (Vmax):    2.34 cm    PV Vmax:  0.34 m/s AV Area (Vmean):   2.50 cm    PV Peak grad:   0.5 mmHg AV Area (VTI):     2.88 cm    RVOT Peak grad: 1 mmHg AV Vmax:           63.80 cm/s AV Vmean:          41.800 cm/s AV VTI:            0.101 m AV Peak Grad:      1.6 mmHg AV Mean Grad:      1.0 mmHg LVOT Vmax:         47.60 cm/s LVOT Vmean:        33.200 cm/s LVOT VTI:          0.093 m LVOT/AV VTI ratio: 0.92  AORTA Ao Root diam: 3.07 cm MITRAL VALVE               TRICUSPID VALVE MV Area (PHT): 3.97 cm    TR Peak grad:   18.8 mmHg MV Decel Time: 191 msec    TR Vmax:        217.00 cm/s MV E velocity: 68.60 cm/s                            SHUNTS                            Systemic VTI:  0.09 m                            Systemic Diam: 2.00 cm Dwayne D Callwood MD Electronically signed by Yolonda Kida MD Signature Date/Time: 04/16/2021/1:20:58 PM    Final    ASSESSMENT AND PLAN:  Stephanie [name] is a 70 y.o. female with PMH chronic diarrhea, diverticulosis, hyperlipidemia, hypertension and history of atrial fibrillation on metoprolol and Eliquis who presents with palpitations over the last few days, persistent course, and associated with chest discomfort, lightheadedness, and shortness of breath.  The patient also reports some generalized body aches.  a fib acute on chronic with RVR -- patient came in the emergency room heart rate increased to 110s on activity along with shortness of breath. -- currently heart rate 60-- 70 -- on Toprol-XL 25 BID and Cardizem 30 mg Q6 -- patient on eliquis -- Dr. Clayborn Bigness to see patient -- patient was on amiodarone in the past discontinued in October 2021 according to outpatient cardiology notes -- will await further recommendations from cardiology  shortness of breath, elevated BNP, a fib,  Echo shows EF of 40 to 45% with wall motion abnormality-- possible acute systolic congestive heart failure -- chest x-ray clear for fluid, CTA lungs negative for PE -- will give one dose of Lasix 20 mg -- continue beta-blockers -- await cardiology recommendation for further workup regarding wall motion abnormality noted on echo -- troponins negative -- patient denies any acute chest pain. She has some chest heaviness with palpitation  Hypertension -- continue beta-blockers and Cardizem  Arthritis -- PRN Tylenol   Procedures: Family communication : daughter in the emergency room Consults : cardiology consultation CODE STATUS: full DVT Prophylaxis : eliquis Level of care: Progressive Cardiac Status is: Observation  The patient remains OBS appropriate and will d/c before 2 midnights.  Dispo: The patient is from: Home  Anticipated d/c is to: Home              Patient currently is not medically stable to d/c.   Difficult to place patient No        TOTAL TIME TAKING CARE OF THIS PATIENT: 30 minutes.  >50% time spent on counselling and coordination of care  Note: This dictation was prepared with Dragon dictation along with smaller phrase technology. Any transcriptional errors that result from this process are unintentional.  Fritzi Mandes M.D    [facility]   CC: Primary care physician; Tracie Harrier, MD Patient ID: Stephanie [name], female   DOB: December 13, 1950, 70 y.o.   MRN: [id number]

## 2021-04-16 NOTE — ED Notes (Signed)
VS assessed. This tech provided pt with coffee. No other needs found at this moment.

## 2021-04-16 NOTE — ED Notes (Addendum)
Report given to [facility], RN, patient to be transported to [place], room 33, when hospital bed arrives.

## 2021-04-16 NOTE — ED Notes (Signed)
Walked pt in hallway per MD request, pts HR went up to 114 but came back down as she continued to walk. Sats dropped to 86% on RA, but immediately came back up upon getting back in bed. NAD.

## 2021-04-16 NOTE — Consult Note (Signed)
CARDIOLOGY CONSULT NOTE               Patient ID: Stephanie [name] MRN: [id number] DOB/AGE: 02/25/1951 70 y.o.  Admit date: 04/15/2021 Referring Physician Dr. Florina Ou Primary Physician Dr. Ginette Pitman primary Primary Cardiologist [facility] Reason for Consultation atrial fibrillation rapid ventricular response, vertigo  HPI: Patient is a 70 year old history of atrial fibrillation on anticoagulation presents with palpitations had episode of dyspnea this morning family case finally came for evaluation patient metoprolol and Eliquis with palpitation dizziness shortness of breath mild chest discomfort patient answer myalgias.  Patient feels her rapid atrial fibrillation makes her feel dizzy shortness of breath palpitation peak rate of around 166 T-max x-ray evaluation.  Patient has history of tobacco abuse GERD chronic diarrhea hypertension and A. fib  Review of systems complete and found to be negative unless listed above     Past Medical History:  Diagnosis Date   A-fib ([place])    Arthritis    Chronic diarrhea    Diverticulosis    Dysrhythmia    ATRIAL FIB   Hyperlipidemia    Hypertension    Restless leg     Past Surgical History:  Procedure Laterality Date   CARDIOVERSION N/A 05/12/2018   Procedure: CARDIOVERSION;  Surgeon: Corey Skains, MD;  Location: [facility] ORS;  Service: Cardiovascular;  Laterality: N/A;   CHOLECYSTECTOMY  2004   COLONOSCOPY WITH PROPOFOL N/A 05/24/2019   Procedure: COLONOSCOPY WITH PROPOFOL;  Surgeon: Toledo, Benay Pike, MD;  Location: [facility] END[facility];  Service: Gastroenterology;  Laterality: N/A;   ESOPHAGOGASTRODUODENOSCOPY (EGD) WITH PROPOFOL N/A 05/24/2019   Procedure: ESOPHAGOGASTRODUODENOSCOPY (EGD) WITH PROPOFOL;  Surgeon: Toledo, Benay Pike, MD;  Location: [facility] ENDOSCOPY;  Service: Gastroenterology;  Laterality: N/A;   KNEE SURGERY     KNEE SURGERY     TEE WITHOUT CARDIOVERSION N/A 05/12/2018   Procedure: TRANSESOPHAGEAL ECHOCARDIOGRAM (TEE);  Surgeon:  Corey Skains, MD;  Location: [facility] ORS;  Service: Cardiovascular;  Laterality: N/A;   TOE SURGERY      Medications Prior to Admission  Medication Sig Dispense Refill Last Dose   calcium citrate-vitamin D (CITRACAL+D) 315-200 MG-UNIT per tablet Take 1 tablet by mouth daily.    04/15/2021 at 1000   cholecalciferol (VITAMIN D) 1000 units tablet Take 1,000 Units by mouth daily.    04/15/2021 at 1000   dicyclomine (BENTYL) 20 MG tablet Take 20 mg by mouth 3 (three) times daily.   04/15/2021 at 1000   ELIQUIS 5 MG TABS tablet Take 5 mg by mouth 2 (two) times daily.  11 04/15/2021 at 1000   hydrochlorothiazide (HYDRODIURIL) 25 MG tablet Take 25 mg by mouth daily.   04/15/2021 at 1000   metoprolol succinate (TOPROL-XL) 50 MG 24 hr tablet Take 50 mg by mouth 2 (two) times daily.  3 04/15/2021 at 1000   sertraline (ZOLOFT) 50 MG tablet Take 50 mg by mouth daily.   04/15/2021 at 1000   varenicline (CHANTIX) 0.5 MG tablet Take 0.5 mg by mouth 2 (two) times daily.   04/15/2021 at 1000   colestipol (COLESTID) 1 g tablet Take 1 g by mouth daily before lunch. (Patient not taking: No sig reported)   Not Taking   Cyanocobalamin (VITAMIN B-12 PO) Take 1,000 mcg by mouth daily.  (Patient not taking: Reported on 04/15/2021)   Not Taking   Social History   Socioeconomic History   Marital status: Single    Spouse name: Not on file   Number of children: Not on file  Years of education: Not on file   Highest education level: Not on file  Occupational History   Not on file  Tobacco Use   Smoking status: Former    Packs/day: 1.00    Years: 40.00    Pack years: 40.00    Types: Cigarettes   Smokeless tobacco: Never  Vaping Use   Vaping Use: Never used  Substance and Sexual Activity   Alcohol use: Not Currently    Comment: social   Drug use: Never   Sexual activity: Never  Other Topics Concern   Not on file  Social History Narrative   Divorced   Just moved from [place].   Originally from [place].    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   Social Determinants of Health   Financial Resource Strain: Not on file  Food Insecurity: Not on file  Transportation Needs: Not on file  Physical Activity: Not on file  Stress: Not on file  Social Connections: Not on file  Intimate Partner Violence: Not on file    Family History  Family history unknown: Yes      Review of systems complete and found to be negative unless listed above      PHYSICAL EXAM  General: Well developed, well nourished, in no acute distress HEENT:  Normocephalic and atramatic Neck:  No JVD.  Lungs: Clear bilaterally to auscultation and percussion. Heart: HRRR . Normal S1 and S2 without gallops or murmurs.  Abdomen: Bowel sounds are positive, abdomen soft and non-tender  Msk:  Back normal, normal gait. Normal strength and tone for age. Extremities: No clubbing, cyanosis or edema.   Neuro: Alert and oriented X 3. Psych:  Good affect, responds appropriately  Labs:   Lab Results  Component Value Date   WBC 7.0 04/16/2021   HGB 13.6 04/16/2021   HCT 39.6 04/16/2021   MCV 94.3 04/16/2021   PLT 204 04/16/2021    Recent Labs  Lab 04/16/21 0703  NA 139  K 3.3*  CL 100  CO2 28  BUN 21  CREATININE 1.03*  CALCIUM 8.4*  PROT 6.3*  BILITOT 0.7  ALKPHOS 56  ALT 12  AST 16  GLUCOSE 106*   Lab Results  Component Value Date   TROPONINI <0.03 05/12/2018    Lab Results  Component Value Date   CHOL 142 09/07/2017   CHOL 178 08/27/2016   CHOL 165 08/19/2015   Lab Results  Component Value Date   HDL 51.10 09/07/2017   HDL 54.00 08/27/2016   HDL 50.40 08/19/2015   Lab Results  Component Value Date   LDLCALC 74 09/07/2017   LDLCALC 101 (H) 08/27/2016   LDLCALC 98 08/19/2015   Lab Results  Component Value Date   TRIG 85.0 09/07/2017   TRIG 116.0 08/27/2016   TRIG 82.0 08/19/2015   Lab Results  Component Value Date   CHOLHDL 3 09/07/2017    CHOLHDL 3 08/27/2016   CHOLHDL 3 08/19/2015   No results found for: LDLDIRECT    Radiology: CT Angio Chest PE W and/or Wo Contrast  Result Date: 04/15/2021 CLINICAL DATA:  Shortness of breath. Evaluate for pulmonary embolism. EXAM: CT ANGIOGRAPHY CHEST WITH CONTRAST TECHNIQUE: Multidetector CT imaging of the chest was performed using the standard protocol during bolus administration of intravenous contrast. Multiplanar CT image reconstructions and MIPs were obtained to evaluate the vascular anatomy. CONTRAST:  23m OMNIPAQUE IOHEXOL 350 MG/ML SOLN COMPARISON:  10/27/2017; 10/14/2016 FINDINGS: Vascular Findings:  There is adequate opacification of the pulmonary arterial system with the main pulmonary artery measuring 414 Hounsfield units. There are no discrete filling defects within the pulmonary arterial tree to suggest pulmonary embolism. Normal caliber the main pulmonary artery. Cardiomegaly. In particular, there is enlargement of the left atrium and ventricle. No pericardial effusion. No evidence of thoracic aortic aneurysm. Scattered atherosclerotic plaque within the aortic arch and descending thoracic aorta. Conventional configuration of the aortic arch. Review of the MIP images confirms the above findings. ---------------------------------------------------------------------------------- Nonvascular Findings: Mediastinum/Lymph Nodes: Solitary mildly prominent though non pathologically enlarged AP window lymph node measures 0.6 cm in greatest short axis diameter (image 34, series 4), similar to the 10/2017 examination and thus favored to be reactive in etiology. No bulky mediastinal, hilar axillary lymphadenopathy. Lungs/Pleura: Minimal dependent subpleural ground-glass atelectasis. No discrete focal airspace opacities. No pleural effusion or pneumothorax. The central pulmonary airways appear widely patent. No discrete pulmonary nodules. Upper abdomen: Limited early arterial phase evaluation of the upper  abdomen demonstrates mild prominence of the CBD, likely the sequela of biliary reservoir phenomena in the setting of post cholecystectomy state. Note is made of a small hiatal hernia and a splenule about the anterior tip of the spleen. Musculoskeletal: No acute or aggressive osseous abnormalities. 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 Note is made of an approximately 2.0 x 1.5 cm nodule within the imaged posteroinferior aspect the left lobe of the thyroid (image 3, series 4) suboptimally evaluated on previous noncontrast CTs. IMPRESSION: 1. No acute cardiopulmonary disease. Specifically, no evidence of pulmonary embolism. 2. Cardiomegaly, in particular, there is enlargement of the left atrium and ventricle. Further evaluation with cardiac echo could be performed as clinically indicated. 3. Incidental note made of an approximately 2.0 cm nodule within the imaged caudal posterior aspect the left lobe of the thyroid, suboptimally evaluated on previous noncontrast chest CTs. Further evaluation with nonemergent dedicated thyroid ultrasound could be performed as indicated. 4.  Aortic Atherosclerosis (ICD10-I70.0). 5. Small hiatal hernia. Electronically Signed   By: Sandi Mariscal M.D.   On: 04/15/2021 15:36   DG Chest Portable 1 View  Result Date: 04/15/2021 CLINICAL DATA:  Shortness of breath, atrial fibrillation EXAM: PORTABLE CHEST 1 VIEW COMPARISON:  05/02/2018 FINDINGS: The heart size and mediastinal contours are within normal limits. Both lungs are clear. The visualized skeletal structures are unremarkable except for degenerative changes noted spine. Trachea midline. Aorta atherosclerotic. IMPRESSION: No active disease. Electronically Signed   By: Jerilynn Mages.  Shick M.D.   On: 04/15/2021 14:37   ECHOCARDIOGRAM COMPLETE  Result Date: 04/16/2021    ECHOCARDIOGRAM REPORT   Patient Name:   ANWYN Dalton Date of Exam: 04/16/2021 Medical Rec #:  [id number]      Height:       65.0 in  Accession #:    [id number]     Weight:       189.0 lb Date of Birth:  10-02-1950      BSA:          1.931 m Patient Age:    19 years       BP:           116/52 mmHg Patient Gender: F              HR:           81 bpm. Exam Location:  [facility] Procedure: 2D Echo, Cardiac Doppler and Color Doppler Indications:     Atrial fibrillation I48.91  History:         Patient has prior history of Echocardiogram examinations, most                  recent 05/12/2018. Arrythmias:Atrial Fibrillation; Risk                  Factors:Hypertension.  Sonographer:     Sherrie Sport RDCS (AE) Referring Phys:  [place] Diagnosing Phys: Yolonda Kida MD IMPRESSIONS  1. Basilar inferior hypo.  2. Left ventricular ejection fraction, by estimation, is 40 to 45%. The left ventricle has mildly decreased function. The left ventricle demonstrates regional wall motion abnormalities (see scoring diagram/findings for description). Left ventricular diastolic parameters were normal.  3. Right ventricular systolic function is mildly reduced. The right ventricular size is moderately enlarged.  4. The mitral valve is normal in structure. No evidence of mitral valve regurgitation.  5. The aortic valve is normal in structure. Aortic valve regurgitation is not visualized. FINDINGS  Left Ventricle: Left ventricular ejection fraction, by estimation, is 40 to 45%. The left ventricle has mildly decreased function. The left ventricle demonstrates regional wall motion abnormalities. The left ventricular internal cavity size was normal in size. There is no left ventricular hypertrophy. Left ventricular diastolic parameters were normal. Right Ventricle: The right ventricular size is moderately enlarged. No increase in right ventricular wall thickness. Right ventricular systolic function is mildly reduced. Left Atrium: Left atrial size was normal in size. Right Atrium: Right atrial size was normal in size. Pericardium: There is no evidence of pericardial  effusion. Mitral Valve: The mitral valve is normal in structure. No evidence of mitral valve regurgitation. Tricuspid Valve: The tricuspid valve is normal in structure. Tricuspid valve regurgitation is trivial. Aortic Valve: The aortic valve is normal in structure. Aortic valve regurgitation is not visualized. Aortic valve mean gradient measures 1.0 mmHg. Aortic valve peak gradient measures 1.6 mmHg. Aortic valve area, by VTI measures 2.88 cm. Pulmonic Valve: The pulmonic valve was grossly normal. Pulmonic valve regurgitation is not visualized. Aorta: The ascending aorta was not well visualized. IAS/Shunts: No atrial level shunt detected by color flow Doppler. Additional Comments: Basilar inferior hypo.  LEFT VENTRICLE PLAX 2D LVIDd:         4.10 cm LVIDs:         3.30 cm LV PW:         1.00 cm LV IVS:        0.95 cm LVOT diam:     2.00 cm LV SV:         29 LV SV Index:   15 LVOT Area:     3.14 cm  RIGHT VENTRICLE RV Basal diam:  3.50 cm LEFT ATRIUM           Index       RIGHT ATRIUM           Index LA diam:      3.00 cm 1.55 cm/m  RA Area:     16.50 cm LA Vol (A2C): 26.0 ml 13.46 ml/m RA Volume:   43.40 ml  22.47 ml/m LA Vol (A4C): 33.1 ml 17.14 ml/m  AORTIC VALVE                   PULMONIC VALVE AV Area (Vmax):    2.34 cm    PV Vmax:        0.34 m/s AV Area (Vmean):   2.50 cm    PV Peak grad:  0.5 mmHg AV Area (VTI):     2.88 cm    RVOT Peak grad: 1 mmHg AV Vmax:           63.80 cm/s AV Vmean:          41.800 cm/s AV VTI:            0.101 m AV Peak Grad:      1.6 mmHg AV Mean Grad:      1.0 mmHg LVOT Vmax:         47.60 cm/s LVOT Vmean:        33.200 cm/s LVOT VTI:          0.093 m LVOT/AV VTI ratio: 0.92  AORTA Ao Root diam: 3.07 cm MITRAL VALVE               TRICUSPID VALVE MV Area (PHT): 3.97 cm    TR Peak grad:   18.8 mmHg MV Decel Time: 191 msec    TR Vmax:        217.00 cm/s MV E velocity: 68.60 cm/s                            SHUNTS                            Systemic VTI:  0.09 m                             Systemic Diam: 2.00 cm Djuna Frechette D Koleen Celia MD Electronically signed by Yolonda Kida MD Signature Date/Time: 04/16/2021/1:20:58 PM    Final     EKG: Atrial fibrillation rapid ventricular response rate of 160 nonspecific finding  ASSESSMENT AND PLAN:  Atrial fibrillation RVR Hypertension Mild obesity Tobacco abuse History of thyroid disease Chest pain . Plan Agree with admission to telemetry follow-up EKGs and troponin Advised patient to refrain from smoking Continue hypertension management and control with metoprolol Will increase metoprolol and add diltiazem Agree with Chantix to help quit smoking Continue anticoagulation with Eliquis Consider meclizine was possible for vertigo Recommend orthostatic blood pressure evaluation   Signed: Yolonda Kida MD 04/16/2021, 5:59 PM

## 2021-04-16 NOTE — Plan of Care (Signed)
  Problem: Education: Goal: Knowledge of General Education information will improve Description: Including pain rating scale, medication(s)/side effects and non-pharmacologic comfort measures 04/16/2021 1523 by Cristela Blue, RN Outcome: Progressing 04/16/2021 1522 by Cristela Blue, RN Outcome: Progressing   Problem: Health Behavior/Discharge Planning: Goal: Ability to manage health-related needs will improve 04/16/2021 1523 by Cristela Blue, RN Outcome: Progressing 04/16/2021 1522 by Cristela Blue, RN Outcome: Progressing   Problem: Clinical Measurements: Goal: Ability to maintain clinical measurements within normal limits will improve 04/16/2021 1523 by Cristela Blue, RN Outcome: Progressing 04/16/2021 1522 by Cristela Blue, RN Outcome: Progressing Goal: Will remain free from infection 04/16/2021 1523 by Cristela Blue, RN Outcome: Progressing 04/16/2021 1522 by Cristela Blue, RN Outcome: Progressing Goal: Diagnostic test results will improve 04/16/2021 1523 by Cristela Blue, RN Outcome: Progressing 04/16/2021 1522 by Cristela Blue, RN Outcome: Progressing Goal: Respiratory complications will improve 04/16/2021 1523 by Cristela Blue, RN Outcome: Progressing 04/16/2021 1522 by Cristela Blue, RN Outcome: Progressing Goal: Cardiovascular complication will be avoided 04/16/2021 1523 by Cristela Blue, RN Outcome: Progressing 04/16/2021 1522 by Cristela Blue, RN Outcome: Progressing   Problem: Activity: Goal: Risk for activity intolerance will decrease 04/16/2021 1523 by Cristela Blue, RN Outcome: Progressing 04/16/2021 1522 by Cristela Blue, RN Outcome: Progressing   Problem: Nutrition: Goal: Adequate nutrition will be maintained 04/16/2021 1523 by Cristela Blue, RN Outcome: Progressing 04/16/2021 1522 by Cristela Blue, RN Outcome: Progressing   Problem: Coping: Goal: Level of anxiety will decrease 04/16/2021 1523 by Cristela Blue, RN Outcome:  Progressing 04/16/2021 1522 by Cristela Blue, RN Outcome: Progressing   Problem: Elimination: Goal: Will not experience complications related to bowel motility 04/16/2021 1523 by Cristela Blue, RN Outcome: Progressing 04/16/2021 1522 by Cristela Blue, RN Outcome: Progressing Goal: Will not experience complications related to urinary retention 04/16/2021 1523 by Cristela Blue, RN Outcome: Progressing 04/16/2021 1522 by Cristela Blue, RN Outcome: Progressing   Problem: Pain Managment: Goal: General experience of comfort will improve 04/16/2021 1523 by Cristela Blue, RN Outcome: Progressing 04/16/2021 1522 by Cristela Blue, RN Outcome: Progressing   Problem: Safety: Goal: Ability to remain free from injury will improve 04/16/2021 1523 by Cristela Blue, RN Outcome: Progressing 04/16/2021 1522 by Cristela Blue, RN Outcome: Progressing   Problem: Skin Integrity: Goal: Risk for impaired skin integrity will decrease 04/16/2021 1523 by Cristela Blue, RN Outcome: Progressing 04/16/2021 1522 by Cristela Blue, RN Outcome: Progressing

## 2021-04-17 DIAGNOSIS — I5021 Acute systolic (congestive) heart failure: Secondary | ICD-10-CM

## 2021-04-17 DIAGNOSIS — I4891 Unspecified atrial fibrillation: Secondary | ICD-10-CM | POA: Diagnosis not present

## 2021-04-17 LAB — HEMOGLOBIN A1C
Hgb A1c MFr Bld: 5.5 % (ref 4.8–5.6)
Mean Plasma Glucose: 111 mg/dL

## 2021-04-17 MED ORDER — DILTIAZEM HCL ER COATED BEADS 120 MG PO CP24
120.0000 mg | ORAL_CAPSULE | Freq: Every day | ORAL | Status: DC
  Administered 2021-04-17: 120 mg via ORAL
  Filled 2021-04-17: qty 1

## 2021-04-17 MED ORDER — DILTIAZEM HCL ER COATED BEADS 120 MG PO CP24: 120.0000 mg | ORAL_CAPSULE | Freq: Every day | ORAL | 0 refills | Status: DC

## 2021-04-17 MED ORDER — POTASSIUM CHLORIDE CRYS ER 20 MEQ PO TBCR
40.0000 meq | EXTENDED_RELEASE_TABLET | Freq: Once | ORAL | Status: AC
  Administered 2021-04-17: 40 meq via ORAL
  Filled 2021-04-17: qty 2

## 2021-04-17 NOTE — Progress Notes (Signed)
[facility] Cardiology    SUBJECTIVE: Patient feels somewhat better less vertigo less lightheadedness denies any palpitations today has not ambulated yet but improving slowly on new regimen of Cardizem and metoprolol no chest pain   Vitals:   04/16/21 2028 04/16/21 2343 04/17/21 0547 04/17/21 0909  BP: (!) 112/49 116/63 133/63 110/77  Pulse: 76 80 70 67  Resp: '18 18 18 16  '$ Temp: 98.4 F (36.9 C) 98 F (36.7 C) 97.6 F (36.4 C) 97.8 F (36.6 C)  TempSrc:    Oral  SpO2: 96% 98% 99% 97%  Weight:      Height:         Intake/Output Summary (Last 24 hours) at 04/17/2021 [id number] Last data filed at 04/16/2021 2100 Gross per 24 hour  Intake 251.46 ml  Output --  Net 251.46 ml      PHYSICAL EXAM  General: Well developed, well nourished, in no acute distress HEENT:  Normocephalic and atramatic Neck:  No JVD.  Lungs: Clear bilaterally to auscultation and percussion. Heart: Irregularly irregular. Normal S1 and S2 without gallops or murmurs.  Abdomen: Bowel sounds are positive, abdomen soft and non-tender  Msk:  Back normal, normal gait. Normal strength and tone for age. Extremities: No clubbing, cyanosis or edema.   Neuro: Alert and oriented X 3. Psych:  Good affect, responds appropriately   LABS: Basic Metabolic Panel: Recent Labs    04/15/21 1301 04/15/21 1621 04/16/21 0703  NA 138  --  139  K 3.6  --  3.3*  CL 99  --  100  CO2 27  --  28  GLUCOSE 110*  --  106*  BUN 20  --  21  CREATININE 1.26*  --  1.03*  CALCIUM 9.2  --  8.4*  MG  --  2.1  --   PHOS  --  2.5  --    Liver Function Tests: Recent Labs    04/16/21 0703  AST 16  ALT 12  ALKPHOS 56  BILITOT 0.7  PROT 6.3*  ALBUMIN 3.1*   No results for input(s): LIPASE, AMYLASE in the last 72 hours. CBC: Recent Labs    04/15/21 1301 04/16/21 0703  WBC 9.2 7.0  NEUTROABS 6.0  --   HGB 15.9* 13.6  HCT 45.8 39.6  MCV 94.8 94.3  PLT 276 204   Cardiac Enzymes: No results for input(s): CKTOTAL, CKMB,  CKMBINDEX, TROPONINI in the last 72 hours. BNP: Invalid input(s): POCBNP D-Dimer: No results for input(s): DDIMER in the last 72 hours. Hemoglobin A1C: Recent Labs    04/15/21 2149  HGBA1C 5.5   Fasting Lipid Panel: No results for input(s): CHOL, HDL, LDLCALC, TRIG, CHOLHDL, LDLDIRECT in the last 72 hours. Thyroid Function Tests: Recent Labs    04/15/21 1621  TSH 1.108   Anemia Panel: No results for input(s): VITAMINB12, FOLATE, FERRITIN, TIBC, IRON, RETICCTPCT in the last 72 hours.  CT Angio Chest PE W and/or Wo Contrast  Result Date: 04/15/2021 CLINICAL DATA:  Shortness of breath. Evaluate for pulmonary embolism. EXAM: CT ANGIOGRAPHY CHEST WITH CONTRAST TECHNIQUE: Multidetector CT imaging of the chest was performed using the standard protocol during bolus administration of intravenous contrast. Multiplanar CT image reconstructions and MIPs were obtained to evaluate the vascular anatomy. CONTRAST:  78m OMNIPAQUE IOHEXOL 350 MG/ML SOLN COMPARISON:  10/27/2017; 10/14/2016 FINDINGS: Vascular Findings: There is adequate opacification of the pulmonary arterial system with the main pulmonary artery measuring 414 Hounsfield units. There are no discrete filling defects within the pulmonary  arterial tree to suggest pulmonary embolism. Normal caliber the main pulmonary artery. Cardiomegaly. In particular, there is enlargement of the left atrium and ventricle. No pericardial effusion. No evidence of thoracic aortic aneurysm. Scattered atherosclerotic plaque within the aortic arch and descending thoracic aorta. Conventional configuration of the aortic arch. Review of the MIP images confirms the above findings. ---------------------------------------------------------------------------------- Nonvascular Findings: Mediastinum/Lymph Nodes: Solitary mildly prominent though non pathologically enlarged AP window lymph node measures 0.6 cm in greatest short axis diameter (image 34, series 4), similar to the  10/2017 examination and thus favored to be reactive in etiology. No bulky mediastinal, hilar axillary lymphadenopathy. Lungs/Pleura: Minimal dependent subpleural ground-glass atelectasis. No discrete focal airspace opacities. No pleural effusion or pneumothorax. The central pulmonary airways appear widely patent. No discrete pulmonary nodules. Upper abdomen: Limited early arterial phase evaluation of the upper abdomen demonstrates mild prominence of the CBD, likely the sequela of biliary reservoir phenomena in the setting of post cholecystectomy state. Note is made of a small hiatal hernia and a splenule about the anterior tip of the spleen. Musculoskeletal: No acute or aggressive osseous abnormalities. 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 Note is made of an approximately 2.0 x 1.5 cm nodule within the imaged posteroinferior aspect the left lobe of the thyroid (image 3, series 4) suboptimally evaluated on previous noncontrast CTs. IMPRESSION: 1. No acute cardiopulmonary disease. Specifically, no evidence of pulmonary embolism. 2. Cardiomegaly, in particular, there is enlargement of the left atrium and ventricle. Further evaluation with cardiac echo could be performed as clinically indicated. 3. Incidental note made of an approximately 2.0 cm nodule within the imaged caudal posterior aspect the left lobe of the thyroid, suboptimally evaluated on previous noncontrast chest CTs. Further evaluation with nonemergent dedicated thyroid ultrasound could be performed as indicated. 4.  Aortic Atherosclerosis (ICD10-I70.0). 5. Small hiatal hernia. Electronically Signed   By: Sandi Mariscal M.D.   On: 04/15/2021 15:36   DG Chest Portable 1 View  Result Date: 04/15/2021 CLINICAL DATA:  Shortness of breath, atrial fibrillation EXAM: PORTABLE CHEST 1 VIEW COMPARISON:  05/02/2018 FINDINGS: The heart size and mediastinal contours are within normal limits. Both lungs are clear. The visualized  skeletal structures are unremarkable except for degenerative changes noted spine. Trachea midline. Aorta atherosclerotic. IMPRESSION: No active disease. Electronically Signed   By: Jerilynn Mages.  Shick M.D.   On: 04/15/2021 14:37   ECHOCARDIOGRAM COMPLETE  Result Date: 04/16/2021    ECHOCARDIOGRAM REPORT   Patient Name:   Stephanie [name] Date of Exam: 04/16/2021 Medical Rec #:  [id number]      Height:       65.0 in Accession #:    [id number]     Weight:       189.0 lb Date of Birth:  1950/11/03      BSA:          1.931 m Patient Age:    70 years       BP:           116/52 mmHg Patient Gender: F              HR:           81 bpm. Exam Location:  [facility] Procedure: 2D Echo, Cardiac Doppler and Color Doppler Indications:     Atrial fibrillation I48.91  History:         Patient has prior history of Echocardiogram examinations, most  recent 05/12/2018. Arrythmias:Atrial Fibrillation; Risk                  Factors:Hypertension.  Sonographer:     Sherrie Sport RDCS (AE) Referring Phys:  [place] Diagnosing Phys: Yolonda Kida MD IMPRESSIONS  1. Basilar inferior hypo.  2. Left ventricular ejection fraction, by estimation, is 40 to 45%. The left ventricle has mildly decreased function. The left ventricle demonstrates regional wall motion abnormalities (see scoring diagram/findings for description). Left ventricular diastolic parameters were normal.  3. Right ventricular systolic function is mildly reduced. The right ventricular size is moderately enlarged.  4. The mitral valve is normal in structure. No evidence of mitral valve regurgitation.  5. The aortic valve is normal in structure. Aortic valve regurgitation is not visualized. FINDINGS  Left Ventricle: Left ventricular ejection fraction, by estimation, is 40 to 45%. The left ventricle has mildly decreased function. The left ventricle demonstrates regional wall motion abnormalities. The left ventricular internal cavity size was normal in size. There is no  left ventricular hypertrophy. Left ventricular diastolic parameters were normal. Right Ventricle: The right ventricular size is moderately enlarged. No increase in right ventricular wall thickness. Right ventricular systolic function is mildly reduced. Left Atrium: Left atrial size was normal in size. Right Atrium: Right atrial size was normal in size. Pericardium: There is no evidence of pericardial effusion. Mitral Valve: The mitral valve is normal in structure. No evidence of mitral valve regurgitation. Tricuspid Valve: The tricuspid valve is normal in structure. Tricuspid valve regurgitation is trivial. Aortic Valve: The aortic valve is normal in structure. Aortic valve regurgitation is not visualized. Aortic valve mean gradient measures 1.0 mmHg. Aortic valve peak gradient measures 1.6 mmHg. Aortic valve area, by VTI measures 2.88 cm. Pulmonic Valve: The pulmonic valve was grossly normal. Pulmonic valve regurgitation is not visualized. Aorta: The ascending aorta was not well visualized. IAS/Shunts: No atrial level shunt detected by color flow Doppler. Additional Comments: Basilar inferior hypo.  LEFT VENTRICLE PLAX 2D LVIDd:         4.10 cm LVIDs:         3.30 cm LV PW:         1.00 cm LV IVS:        0.95 cm LVOT diam:     2.00 cm LV SV:         29 LV SV Index:   15 LVOT Area:     3.14 cm  RIGHT VENTRICLE RV Basal diam:  3.50 cm LEFT ATRIUM           Index       RIGHT ATRIUM           Index LA diam:      3.00 cm 1.55 cm/m  RA Area:     16.50 cm LA Vol (A2C): 26.0 ml 13.46 ml/m RA Volume:   43.40 ml  22.47 ml/m LA Vol (A4C): 33.1 ml 17.14 ml/m  AORTIC VALVE                   PULMONIC VALVE AV Area (Vmax):    2.34 cm    PV Vmax:        0.34 m/s AV Area (Vmean):   2.50 cm    PV Peak grad:   0.5 mmHg AV Area (VTI):     2.88 cm    RVOT Peak grad: 1 mmHg AV Vmax:           63.80 cm/s  AV Vmean:          41.800 cm/s AV VTI:            0.101 m AV Peak Grad:      1.6 mmHg AV Mean Grad:      1.0 mmHg LVOT Vmax:          47.60 cm/s LVOT Vmean:        33.200 cm/s LVOT VTI:          0.093 m LVOT/AV VTI ratio: 0.92  AORTA Ao Root diam: 3.07 cm MITRAL VALVE               TRICUSPID VALVE MV Area (PHT): 3.97 cm    TR Peak grad:   18.8 mmHg MV Decel Time: 191 msec    TR Vmax:        217.00 cm/s MV E velocity: 68.60 cm/s                            SHUNTS                            Systemic VTI:  0.09 m                            Systemic Diam: 2.00 cm Noora Locascio D Dasha Kawabata MD Electronically signed by Yolonda Kida MD Signature Date/Time: 04/16/2021/1:20:58 PM    Final      Echo mildly depressed left ventricular function ejection fraction around 40 to 45% lateral wall hypo-  TELEMETRY: Atrial fibrillation rate of 75 nonspecific ST-T changes:  ASSESSMENT AND PLAN:  Principal Problem:   Atrial fibrillation with rapid ventricular response (HCC) Active Problems:   Essential hypertension   Tobacco abuse   Obesity    Plan Atrial fibrillation improved heart rate around 70 with rate control of metoprolol and Cardizem anticoagulation with Eliquis Hypertension will be managed and metoprolol and Cardizem consider adding ACE or ARB Cardiomyopathy mild ejection fraction around 40 to 45% possible lateral wall hypokinesis mildly depressed consider adding ACE ARB or Arni Patient may need further work-up for mild cardiomyopathy functional study or cardiac cath Smoking by history advised patient refrain from tobacco abuse Obesity modest recommend modest weight loss exercise portion control Vertigo intermittent recurrent unclear if related to A. fib with recommend rate control adequate hydration follow-up with ENT or neurology if symptoms persist Consider cardioversion if A. fib still present on follow-up as an outpatient   Yolonda Kida, MD 04/17/2021 9:18 AM

## 2021-04-17 NOTE — Discharge Summary (Signed)
[facility] [place] NAME: Stephanie [name]    MR#:  [id number]  DATE OF BIRTH:  1951/03/30  DATE OF ADMISSION:  04/15/2021 ADMITTING PHYSICIAN: Para Skeans, MD  DATE OF DISCHARGE: 04/17/2021  PRIMARY CARE PHYSICIAN: Tracie Harrier, MD    ADMISSION DIAGNOSIS:  Atrial fibrillation with rapid ventricular response ([place]) [I48.91] Atrial fibrillation, unspecified type ([place]) [I48.91]  DISCHARGE DIAGNOSIS:  a fib with RVR acute systolic congestive heart failure resolved  SECONDARY DIAGNOSIS:   Past Medical History:  Diagnosis Date   A-fib ([place])    Arthritis    Chronic diarrhea    Diverticulosis    Dysrhythmia    ATRIAL FIB   Hyperlipidemia    Hypertension    Restless leg     HOSPITAL COURSE:  Stephanie [name] is a 70 y.o. female with PMH chronic diarrhea, diverticulosis, hyperlipidemia, hypertension and history of atrial fibrillation on metoprolol and Eliquis who presents with palpitations over the last few days, persistent course, and associated with chest discomfort, lightheadedness, and shortness of breath.  The patient also reports some generalized body aches.   a fib acute on chronic with RVR -- patient came in the emergency room heart rate increased to 110s on activity along with shortness of breath. -- currently heart rate 60-- 70 -- on Toprol-XL 50 BID (home dose) and Cardizem 30 mg Q6--change to Cardizem CD 120 mg qd -- on eliquis -- Dr. Etta Quill input noted -- patient was on amiodarone in the past discontinued in October 2021 according to outpatient cardiology notes   shortness of breath, elevated BNP, a fib, Echo shows EF of 40 to 45% with wall motion abnormality-- acute systolic congestive heart failure -- chest x-ray clear for fluid, CTA lungs negative for PE -- will give one dose of Lasix 20 mg--Good UOP. Feels back to baseline -- continue beta-blockers -- troponins negative -Ace inhibitors to be considered as out  pt   Hypertension -- continue beta-blockers and Cardizem   Arthritis -- PRN Tylenol   patient ambulating to the bathroom well. No other issues. Okay to go home. Should follow-up with Dr. Clayborn Bigness in a week to 10 days.   Consults : cardiology consultation CODE STATUS: full DVT Prophylaxis : eliquis Level of care: Progressive Cardiac Status is: Observation   The patient remains OBS appropriate and will d/c before 2 midnights.   Dispo: The patient is from: Home              Anticipated d/c is to: Home              Patient currently is medically stable to d/c.              Difficult to place patient No    CONSULTS OBTAINED:  Treatment Team:  Yolonda Kida, MD  DRUG ALLERGIES:   Allergies  Allergen Reactions   Codeine Other (See Comments)    Cannot tolerate alone, only when mixed with another medication.   Lactose Intolerance (Gi)    Phenazopyridine Hcl Hives    DISCHARGE MEDICATIONS:   Allergies as of 04/17/2021       Reactions   Codeine Other (See Comments)   Cannot tolerate alone, only when mixed with another medication.   Lactose Intolerance (gi)    Phenazopyridine Hcl Hives        Medication List     STOP taking these medications    colestipol 1 g tablet Commonly known as: COLESTID  VITAMIN B-12 PO       TAKE these medications    calcium citrate-vitamin D 315-200 MG-UNIT tablet Commonly known as: CITRACAL+D Take 1 tablet by mouth daily.   cholecalciferol 25 MCG (1000 UNIT) tablet Commonly known as: VITAMIN D Take 1,000 Units by mouth daily.   dicyclomine 20 MG tablet Commonly known as: BENTYL Take 20 mg by mouth 3 (three) times daily.   diltiazem 120 MG 24 hr capsule Commonly known as: CARDIZEM CD Take 1 capsule (120 mg total) by mouth daily.   Eliquis 5 MG Tabs tablet Generic drug: apixaban Take 5 mg by mouth 2 (two) times daily.   hydrochlorothiazide 25 MG tablet Commonly known as: HYDRODIURIL Take 25 mg by mouth daily.    metoprolol succinate 50 MG 24 hr tablet Commonly known as: TOPROL-XL Take 50 mg by mouth 2 (two) times daily.   sertraline 50 MG tablet Commonly known as: ZOLOFT Take 50 mg by mouth daily.   varenicline 0.5 MG tablet Commonly known as: CHANTIX Take 0.5 mg by mouth 2 (two) times daily.        If you experience worsening of your admission symptoms, develop shortness of breath, life threatening emergency, suicidal or homicidal thoughts you must seek medical attention immediately by calling 911 or calling your MD immediately  if symptoms less severe.  You Must read complete instructions/literature along with all the possible adverse reactions/side effects for all the Medicines you take and that have been prescribed to you. Take any new Medicines after you have completely understood and accept all the possible adverse reactions/side effects.   Please note  You were cared for by a hospitalist during your hospital stay. If you have any questions about your discharge medications or the care you received while you were in the hospital after you are discharged, you can call the unit and asked to speak with the hospitalist on call if the hospitalist that took care of you is not available. Once you are discharged, your primary care physician will handle any further medical issues. Please note that NO REFILLS for any discharge medications will be authorized once you are discharged, as it is imperative that you return to your primary care physician (or establish a relationship with a primary care physician if you do not have one) for your aftercare needs so that they can reassess your need for medications and monitor your lab values. Today   SUBJECTIVE  doing well. Ambulating to the bathroom. No shortness of breath or dizziness. Denies chest   VITAL SIGNS:  Blood pressure 110/77, pulse 67, temperature 97.8 F (36.6 C), temperature source Oral, resp. rate 16, height '5\' 5"'$  (1.651 m), weight 85.7 kg,  SpO2 97 %.  I/O:   Intake/Output Summary (Last 24 hours) at 04/17/2021 1004 Last data filed at 04/16/2021 2100 Gross per 24 hour  Intake 251.46 ml  Output --  Net 251.46 ml    PHYSICAL EXAMINATION:  GENERAL:  70 y.o.-year-old patient lying in the bed with no acute distress. Obese LUNGS: Normal breath sounds bilaterally, no wheezing, rales,rhonchi or crepitation. No use of accessory muscles of respiration.  CARDIOVASCULAR: S1, S2 normal. No murmurs, rubs, or gallops.  ABDOMEN: Soft, non-tender, non-distended. Bowel sounds present. No organomegaly or mass.  EXTREMITIES: No pedal edema, cyanosis, or clubbing.  NEUROLOGIC: Cranial nerves II through XII are intact. Muscle strength 5/5 in all extremities. Sensation intact. Gait not checked.  PSYCHIATRIC: The patient is alert and oriented x 3.  SKIN: No obvious rash,  lesion, or ulcer.   DATA REVIEW:   CBC  Recent Labs  Lab 04/16/21 0703  WBC 7.0  HGB 13.6  HCT 39.6  PLT 204    Chemistries  Recent Labs  Lab 04/15/21 1621 04/16/21 0703  NA  --  139  K  --  3.3*  CL  --  100  CO2  --  28  GLUCOSE  --  106*  BUN  --  21  CREATININE  --  1.03*  CALCIUM  --  8.4*  MG 2.1  --   AST  --  16  ALT  --  12  ALKPHOS  --  56  BILITOT  --  0.7    Microbiology Results   Recent Results (from the past 240 hour(s))  Resp Panel by RT-PCR (Flu A&B, Covid) Nasopharyngeal Swab     Status: None   Collection Time: 04/15/21  1:56 PM   Specimen: Nasopharyngeal Swab; Nasopharyngeal(NP) swabs in vial transport medium  Result Value Ref Range Status   SARS Coronavirus 2 by RT PCR NEGATIVE NEGATIVE Final    Comment: (NOTE) SARS-CoV-2 target nucleic acids are NOT DETECTED.  The SARS-CoV-2 RNA is generally detectable in upper respiratory specimens during the acute phase of infection. The lowest concentration of SARS-CoV-2 viral copies this assay can detect is 138 copies/mL. A negative result does not preclude SARS-Cov-2 infection and  should not be used as the sole basis for treatment or other patient management decisions. A negative result may occur with  improper specimen collection/handling, submission of specimen other than nasopharyngeal swab, presence of viral mutation(s) within the areas targeted by this assay, and inadequate number of viral copies(<138 copies/mL). A negative result must be combined with clinical observations, patient history, and epidemiological information. The expected result is Negative.  Fact Sheet for Patients:  EntrepreneurPulse.com.au  Fact Sheet for Healthcare Providers:  IncredibleEmployment.be  This test is no t yet approved or cleared by the [place] FDA and  has been authorized for detection and/or diagnosis of SARS-CoV-2 by FDA under an Emergency Use Authorization (EUA). This EUA will remain  in effect (meaning this test can be used) for the duration of the COVID-19 declaration under Section 564(b)(1) of the Act, 21 U.S.C.section 360bbb-3(b)(1), unless the authorization is terminated  or revoked sooner.       Influenza A by PCR NEGATIVE NEGATIVE Final   Influenza B by PCR NEGATIVE NEGATIVE Final    Comment: (NOTE) The Xpert Xpress SARS-CoV-2/FLU/RSV plus assay is intended as an aid in the diagnosis of influenza from Nasopharyngeal swab specimens and should not be used as a sole basis for treatment. Nasal washings and aspirates are unacceptable for Xpert Xpress SARS-CoV-2/FLU/RSV testing.  Fact Sheet for Patients: EntrepreneurPulse.com.au  Fact Sheet for Healthcare Providers: IncredibleEmployment.be  This test is not yet approved or cleared by the [place] FDA and has been authorized for detection and/or diagnosis of SARS-CoV-2 by FDA under an Emergency Use Authorization (EUA). This EUA will remain in effect (meaning this test can be used) for the duration of the COVID-19 declaration  under Section 564(b)(1) of the Act, 21 U.S.C. section 360bbb-3(b)(1), unless the authorization is terminated or revoked.  Performed at [facility], [address], [place], [place] [address]     RADIOLOGY:  CT Angio Chest PE W and/or Wo Contrast  Result Date: 04/15/2021 CLINICAL DATA:  Shortness of breath. Evaluate for pulmonary embolism. EXAM: CT ANGIOGRAPHY CHEST WITH CONTRAST TECHNIQUE: Multidetector CT imaging of the chest  was performed using the standard protocol during bolus administration of intravenous contrast. Multiplanar CT image reconstructions and MIPs were obtained to evaluate the vascular anatomy. CONTRAST:  30m OMNIPAQUE IOHEXOL 350 MG/ML SOLN COMPARISON:  10/27/2017; 10/14/2016 FINDINGS: Vascular Findings: There is adequate opacification of the pulmonary arterial system with the main pulmonary artery measuring 414 Hounsfield units. There are no discrete filling defects within the pulmonary arterial tree to suggest pulmonary embolism. Normal caliber the main pulmonary artery. Cardiomegaly. In particular, there is enlargement of the left atrium and ventricle. No pericardial effusion. No evidence of thoracic aortic aneurysm. Scattered atherosclerotic plaque within the aortic arch and descending thoracic aorta. Conventional configuration of the aortic arch. Review of the MIP images confirms the above findings. ---------------------------------------------------------------------------------- Nonvascular Findings: Mediastinum/Lymph Nodes: Solitary mildly prominent though non pathologically enlarged AP window lymph node measures 0.6 cm in greatest short axis diameter (image 34, series 4), similar to the 10/2017 examination and thus favored to be reactive in etiology. No bulky mediastinal, hilar axillary lymphadenopathy. Lungs/Pleura: Minimal dependent subpleural ground-glass atelectasis. No discrete focal airspace opacities. No pleural effusion or pneumothorax. The central  pulmonary airways appear widely patent. No discrete pulmonary nodules. Upper abdomen: Limited early arterial phase evaluation of the upper abdomen demonstrates mild prominence of the CBD, likely the sequela of biliary reservoir phenomena in the setting of post cholecystectomy state. Note is made of a small hiatal hernia and a splenule about the anterior tip of the spleen. Musculoskeletal: No acute or aggressive osseous abnormalities. 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 Note is made of an approximately 2.0 x 1.5 cm nodule within the imaged posteroinferior aspect the left lobe of the thyroid (image 3, series 4) suboptimally evaluated on previous noncontrast CTs. IMPRESSION: 1. No acute cardiopulmonary disease. Specifically, no evidence of pulmonary embolism. 2. Cardiomegaly, in particular, there is enlargement of the left atrium and ventricle. Further evaluation with cardiac echo could be performed as clinically indicated. 3. Incidental note made of an approximately 2.0 cm nodule within the imaged caudal posterior aspect the left lobe of the thyroid, suboptimally evaluated on previous noncontrast chest CTs. Further evaluation with nonemergent dedicated thyroid ultrasound could be performed as indicated. 4.  Aortic Atherosclerosis (ICD10-I70.0). 5. Small hiatal hernia. Electronically Signed   By: JSandi MariscalM.D.   On: 04/15/2021 15:36   DG Chest Portable 1 View  Result Date: 04/15/2021 CLINICAL DATA:  Shortness of breath, atrial fibrillation EXAM: PORTABLE CHEST 1 VIEW COMPARISON:  05/02/2018 FINDINGS: The heart size and mediastinal contours are within normal limits. Both lungs are clear. The visualized skeletal structures are unremarkable except for degenerative changes noted spine. Trachea midline. Aorta atherosclerotic. IMPRESSION: No active disease. Electronically Signed   By: MJerilynn Mages  Shick M.D.   On: 04/15/2021 14:37   ECHOCARDIOGRAM COMPLETE  Result Date: 04/16/2021     ECHOCARDIOGRAM REPORT   Patient Name:   GMARLISSMERRITT Date of Exam: 04/16/2021 Medical Rec #:  [id number]     Height:       65.0 in Accession #:    [id number]    Weight:       189.0 lb Date of Birth:  6Jul 14, 1952     BSA:          1.931 m Patient Age:    89years       BP:           116/52 mmHg Patient Gender: F  HR:           81 bpm. Exam Location:  [facility] Procedure: 2D Echo, Cardiac Doppler and Color Doppler Indications:     Atrial fibrillation I48.91  History:         Patient has prior history of Echocardiogram examinations, most                  recent 05/12/2018. Arrythmias:Atrial Fibrillation; Risk                  Factors:Hypertension.  Sonographer:     Sherrie Sport RDCS (AE) Referring Phys:  [place] Diagnosing Phys: Yolonda Kida MD IMPRESSIONS  1. Basilar inferior hypo.  2. Left ventricular ejection fraction, by estimation, is 40 to 45%. The left ventricle has mildly decreased function. The left ventricle demonstrates regional wall motion abnormalities (see scoring diagram/findings for description). Left ventricular diastolic parameters were normal.  3. Right ventricular systolic function is mildly reduced. The right ventricular size is moderately enlarged.  4. The mitral valve is normal in structure. No evidence of mitral valve regurgitation.  5. The aortic valve is normal in structure. Aortic valve regurgitation is not visualized. FINDINGS  Left Ventricle: Left ventricular ejection fraction, by estimation, is 40 to 45%. The left ventricle has mildly decreased function. The left ventricle demonstrates regional wall motion abnormalities. The left ventricular internal cavity size was normal in size. There is no left ventricular hypertrophy. Left ventricular diastolic parameters were normal. Right Ventricle: The right ventricular size is moderately enlarged. No increase in right ventricular wall thickness. Right ventricular systolic function is mildly reduced. Left Atrium: Left atrial  size was normal in size. Right Atrium: Right atrial size was normal in size. Pericardium: There is no evidence of pericardial effusion. Mitral Valve: The mitral valve is normal in structure. No evidence of mitral valve regurgitation. Tricuspid Valve: The tricuspid valve is normal in structure. Tricuspid valve regurgitation is trivial. Aortic Valve: The aortic valve is normal in structure. Aortic valve regurgitation is not visualized. Aortic valve mean gradient measures 1.0 mmHg. Aortic valve peak gradient measures 1.6 mmHg. Aortic valve area, by VTI measures 2.88 cm. Pulmonic Valve: The pulmonic valve was grossly normal. Pulmonic valve regurgitation is not visualized. Aorta: The ascending aorta was not well visualized. IAS/Shunts: No atrial level shunt detected by color flow Doppler. Additional Comments: Basilar inferior hypo.  LEFT VENTRICLE PLAX 2D LVIDd:         4.10 cm LVIDs:         3.30 cm LV PW:         1.00 cm LV IVS:        0.95 cm LVOT diam:     2.00 cm LV SV:         29 LV SV Index:   15 LVOT Area:     3.14 cm  RIGHT VENTRICLE RV Basal diam:  3.50 cm LEFT ATRIUM           Index       RIGHT ATRIUM           Index LA diam:      3.00 cm 1.55 cm/m  RA Area:     16.50 cm LA Vol (A2C): 26.0 ml 13.46 ml/m RA Volume:   43.40 ml  22.47 ml/m LA Vol (A4C): 33.1 ml 17.14 ml/m  AORTIC VALVE                   PULMONIC VALVE AV Area (  Vmax):    2.34 cm    PV Vmax:        0.34 m/s AV Area (Vmean):   2.50 cm    PV Peak grad:   0.5 mmHg AV Area (VTI):     2.88 cm    RVOT Peak grad: 1 mmHg AV Vmax:           63.80 cm/s AV Vmean:          41.800 cm/s AV VTI:            0.101 m AV Peak Grad:      1.6 mmHg AV Mean Grad:      1.0 mmHg LVOT Vmax:         47.60 cm/s LVOT Vmean:        33.200 cm/s LVOT VTI:          0.093 m LVOT/AV VTI ratio: 0.92  AORTA Ao Root diam: 3.07 cm MITRAL VALVE               TRICUSPID VALVE MV Area (PHT): 3.97 cm    TR Peak grad:   18.8 mmHg MV Decel Time: 191 msec    TR Vmax:        217.00  cm/s MV E velocity: 68.60 cm/s                            SHUNTS                            Systemic VTI:  0.09 m                            Systemic Diam: 2.00 cm Yolonda Kida MD Electronically signed by Yolonda Kida MD Signature Date/Time: 04/16/2021/1:20:58 PM    Final      CODE STATUS:     Code Status Orders  (From admission, onward)           Start     Ordered   04/15/21 1949  Full code  Continuous        04/15/21 1950           Code Status History     Date Active Date Inactive Code Status Order ID Comments User Context   05/11/2018 2054 05/13/2018 1804 Full Code [id number]  Salary, Avel Peace, MD Inpatient        TOTAL TIME TAKING CARE OF THIS PATIENT: 40 minutes.    Fritzi Mandes M.D  [facility]    CC: Primary care physician; Tracie Harrier, MD

## 2021-04-17 NOTE — Progress Notes (Signed)
Patient is alert and oriented x 4. No complaints of pain. Ambulated around hallway with complaints of mild shortness of breath which is not new. Heart rate is still Afib but rate has been under 100. She has had no changes from her previous assessment. She is being discharged home and all discharge instructions went over with patient and she has no questions at this time.

## 2021-04-24 DIAGNOSIS — I5022 Chronic systolic (congestive) heart failure: Secondary | ICD-10-CM | POA: Diagnosis not present

## 2021-04-24 DIAGNOSIS — Z09 Encounter for follow-up examination after completed treatment for conditions other than malignant neoplasm: Secondary | ICD-10-CM | POA: Diagnosis not present

## 2021-04-24 DIAGNOSIS — E876 Hypokalemia: Secondary | ICD-10-CM | POA: Diagnosis not present

## 2021-04-24 DIAGNOSIS — I110 Hypertensive heart disease with heart failure: Secondary | ICD-10-CM | POA: Diagnosis not present

## 2021-04-24 DIAGNOSIS — I480 Paroxysmal atrial fibrillation: Secondary | ICD-10-CM | POA: Diagnosis not present

## 2021-05-06 DIAGNOSIS — R0602 Shortness of breath: Secondary | ICD-10-CM | POA: Diagnosis not present

## 2021-05-06 DIAGNOSIS — J439 Emphysema, unspecified: Secondary | ICD-10-CM | POA: Diagnosis not present

## 2021-05-06 DIAGNOSIS — Z09 Encounter for follow-up examination after completed treatment for conditions other than malignant neoplasm: Secondary | ICD-10-CM | POA: Diagnosis not present

## 2021-05-06 DIAGNOSIS — I872 Venous insufficiency (chronic) (peripheral): Secondary | ICD-10-CM | POA: Diagnosis not present

## 2021-05-06 DIAGNOSIS — Z87891 Personal history of nicotine dependence: Secondary | ICD-10-CM | POA: Diagnosis not present

## 2021-05-06 DIAGNOSIS — R42 Dizziness and giddiness: Secondary | ICD-10-CM | POA: Diagnosis not present

## 2021-05-06 DIAGNOSIS — E782 Mixed hyperlipidemia: Secondary | ICD-10-CM | POA: Diagnosis not present

## 2021-05-06 DIAGNOSIS — I480 Paroxysmal atrial fibrillation: Secondary | ICD-10-CM | POA: Diagnosis not present

## 2021-05-06 DIAGNOSIS — E876 Hypokalemia: Secondary | ICD-10-CM | POA: Diagnosis not present

## 2021-05-06 DIAGNOSIS — I10 Essential (primary) hypertension: Secondary | ICD-10-CM | POA: Diagnosis not present

## 2021-05-06 DIAGNOSIS — R001 Bradycardia, unspecified: Secondary | ICD-10-CM | POA: Diagnosis not present

## 2021-05-06 DIAGNOSIS — I5022 Chronic systolic (congestive) heart failure: Secondary | ICD-10-CM | POA: Diagnosis not present

## 2021-05-06 DIAGNOSIS — I4891 Unspecified atrial fibrillation: Secondary | ICD-10-CM | POA: Diagnosis not present

## 2021-05-16 DIAGNOSIS — M5416 Radiculopathy, lumbar region: Secondary | ICD-10-CM | POA: Diagnosis not present

## 2021-05-20 DIAGNOSIS — I480 Paroxysmal atrial fibrillation: Secondary | ICD-10-CM | POA: Diagnosis not present

## 2021-06-03 DIAGNOSIS — I10 Essential (primary) hypertension: Secondary | ICD-10-CM | POA: Diagnosis not present

## 2021-06-03 DIAGNOSIS — R42 Dizziness and giddiness: Secondary | ICD-10-CM | POA: Diagnosis not present

## 2021-06-03 DIAGNOSIS — R0602 Shortness of breath: Secondary | ICD-10-CM | POA: Diagnosis not present

## 2021-06-03 DIAGNOSIS — Z7901 Long term (current) use of anticoagulants: Secondary | ICD-10-CM | POA: Diagnosis not present

## 2021-06-03 DIAGNOSIS — Z5181 Encounter for therapeutic drug level monitoring: Secondary | ICD-10-CM | POA: Diagnosis not present

## 2021-06-03 DIAGNOSIS — Z87891 Personal history of nicotine dependence: Secondary | ICD-10-CM | POA: Diagnosis not present

## 2021-06-03 DIAGNOSIS — R001 Bradycardia, unspecified: Secondary | ICD-10-CM | POA: Diagnosis not present

## 2021-06-03 DIAGNOSIS — E782 Mixed hyperlipidemia: Secondary | ICD-10-CM | POA: Diagnosis not present

## 2021-06-03 DIAGNOSIS — I872 Venous insufficiency (chronic) (peripheral): Secondary | ICD-10-CM | POA: Diagnosis not present

## 2021-06-03 DIAGNOSIS — Z79899 Other long term (current) drug therapy: Secondary | ICD-10-CM | POA: Diagnosis not present

## 2021-06-03 DIAGNOSIS — I4891 Unspecified atrial fibrillation: Secondary | ICD-10-CM | POA: Diagnosis not present

## 2021-06-03 DIAGNOSIS — J439 Emphysema, unspecified: Secondary | ICD-10-CM | POA: Diagnosis not present

## 2021-06-11 DIAGNOSIS — M5416 Radiculopathy, lumbar region: Secondary | ICD-10-CM | POA: Diagnosis not present

## 2021-06-19 ENCOUNTER — Encounter: Payer: Self-pay | Admitting: Internal Medicine

## 2021-06-19 ENCOUNTER — Ambulatory Visit: Payer: Medicare HMO

## 2021-06-19 ENCOUNTER — Encounter
Admission: RE | Disposition: A | Discharge status: Home / Self Care | Payer: Self-pay | Source: Home / Self Care | Attending: Internal Medicine

## 2021-06-19 ENCOUNTER — Ambulatory Visit
Admission: RE | Admit: 2021-06-19 | Discharge: 2021-06-19 | Disposition: A | Discharge status: Home / Self Care | Payer: Medicare HMO | Attending: Internal Medicine | Admitting: Internal Medicine

## 2021-06-19 DIAGNOSIS — I4891 Unspecified atrial fibrillation: Secondary | ICD-10-CM | POA: Diagnosis not present

## 2021-06-19 DIAGNOSIS — F418 Other specified anxiety disorders: Secondary | ICD-10-CM | POA: Diagnosis not present

## 2021-06-19 HISTORY — PX: CARDIOVERSION: SHX1299

## 2021-06-19 SURGERY — CARDIOVERSION
Anesthesia: General

## 2021-06-19 MED ORDER — PROPOFOL 10 MG/ML IV BOLUS
INTRAVENOUS | Status: AC
  Filled 2021-06-19: qty 20

## 2021-06-19 MED ORDER — PROPOFOL 10 MG/ML IV BOLUS
INTRAVENOUS | Status: AC
  Filled 2021-06-19: qty 40

## 2021-06-19 MED ORDER — SODIUM CHLORIDE 0.9 % IV SOLN
INTRAVENOUS | Status: DC | PRN
Start: 2021-06-19 — End: 2021-06-19

## 2021-06-19 MED ORDER — GLYCOPYRROLATE 0.2 MG/ML IJ SOLN
INTRAMUSCULAR | Status: AC
  Filled 2021-06-19: qty 1

## 2021-06-19 MED ORDER — PROPOFOL 10 MG/ML IV BOLUS
INTRAVENOUS | Status: DC | PRN
  Administered 2021-06-19: 50 mg via INTRAVENOUS

## 2021-06-19 MED ORDER — EPHEDRINE 5 MG/ML INJ
INTRAVENOUS | Status: AC
  Filled 2021-06-19: qty 5

## 2021-06-19 MED ORDER — LIDOCAINE HCL (CARDIAC) PF 100 MG/5ML IV SOSY
PREFILLED_SYRINGE | INTRAVENOUS | Status: DC | PRN
  Administered 2021-06-19: 50 mg via INTRATRACHEAL

## 2021-06-19 MED ORDER — LIDOCAINE HCL (PF) 2 % IJ SOLN
INTRAMUSCULAR | Status: AC
  Filled 2021-06-19: qty 5

## 2021-06-19 MED ORDER — SUCCINYLCHOLINE CHLORIDE 200 MG/10ML IV SOSY
PREFILLED_SYRINGE | INTRAVENOUS | Status: AC
  Filled 2021-06-19: qty 10

## 2021-06-19 NOTE — CV Procedure (Signed)
Electrical Cardioversion Procedure Note   Procedure: Electrical Cardioversion Indications:  Atrial Fibrillation  Procedure Details Consent: Risks of procedure as well as the alternatives and risks of each were explained to the (patient/caregiver).  Consent for procedure obtained. Time Out: Verified patient identification, verified procedure, site/side was marked, verified correct patient position, special equipment/implants available, medications/allergies/relevent history reviewed, required imaging and test results available.  Performed  Patient placed on cardiac monitor, pulse oximetry, supplemental oxygen as necessary.  Sedation given:  Anesthesia Pacer pads placed anterior and posterior chest.  Cardioverted 1 time(s).  Cardioverted at 120J.  Evaluation Findings: Post procedure EKG shows: NSR Complications: None Patient did tolerate procedure well.    Lujean Amel MD Cardiology

## 2021-06-19 NOTE — Transfer of Care (Signed)
Immediate Anesthesia Transfer of Care Note  Patient: Stephanie [name]  Procedure(s) Performed: CARDIOVERSION  Patient Location: Short Stay  Anesthesia Type:General  Level of Consciousness: drowsy  Airway & Oxygen Therapy: Patient Spontanous Breathing and Patient connected to nasal cannula oxygen  Post-op Assessment: Report given to RN and Post -op Vital signs reviewed and stable  Post vital signs: Reviewed and stable  Last Vitals:  Vitals Value Taken Time  BP 140/78 06/19/21 0736  Temp    Pulse 37 06/19/21 0740  Resp 28 06/19/21 0740  SpO2 97 % 06/19/21 0740  Vitals shown include unvalidated device data.  Last Pain:  Vitals:   06/19/21 0656  TempSrc: Oral         Complications: No notable events documented.

## 2021-06-19 NOTE — Anesthesia Preprocedure Evaluation (Addendum)
Anesthesia Evaluation  Patient identified by MRN, date of birth, ID band Patient awake    Reviewed: Allergy & Precautions, NPO status , Patient's Chart, lab work & pertinent test results  History of Anesthesia Complications Negative for: history of anesthetic complications  Airway Mallampati: III  TM Distance: <3 FB Neck ROM: full    Dental  (+) Chipped, Poor Dentition, Missing, Caps   Pulmonary neg shortness of breath, sleep apnea , former smoker,    Pulmonary exam normal        Cardiovascular Exercise Tolerance: Good hypertension, (-) angina+CHF  + dysrhythmias Atrial Fibrillation  Rhythm:irregular Rate:Normal     Neuro/Psych PSYCHIATRIC DISORDERS negative neurological ROS     GI/Hepatic negative GI ROS, Neg liver ROS,   Endo/Other  negative endocrine ROS  Renal/GU negative Renal ROS  negative genitourinary   Musculoskeletal  (+) Arthritis ,   Abdominal   Peds  Hematology negative hematology ROS (+)   Anesthesia Other Findings Past Medical History: No date: A-fib ([place]) No date: Arthritis No date: Chronic diarrhea No date: Diverticulosis No date: Dysrhythmia     Comment:  ATRIAL FIB No date: Hyperlipidemia No date: Hypertension No date: Restless leg  Past Surgical History: 05/12/2018: CARDIOVERSION; N/A     Comment:  Procedure: CARDIOVERSION;  Surgeon: Corey Skains,               MD;  Location: [facility] ORS;  Service: Cardiovascular;                Laterality: N/A; 2004: CHOLECYSTECTOMY 05/24/2019: COLONOSCOPY WITH PROPOFOL; N/A     Comment:  Procedure: COLONOSCOPY WITH PROPOFOL;  Surgeon: Tol[place],               Benay Pike, MD;  Location: [facility] ENDOSCOPY;  Service:               Gastroenterology;  Laterality: N/A; 05/24/2019: ESOPHAGOGASTRODUODENOSCOPY (EGD) WITH PROPOFOL; N/A     Comment:  Procedure: ESOPHAGOGASTRODUODENOSCOPY (EGD) WITH               PROPOFOL;  Surgeon: Toledo, Benay Pike, MD;  Location:                [facility] ENDOSCOPY;  Service: Gastroenterology;  Laterality:               N/A; No date: KNEE SURGERY No date: KNEE SURGERY 05/12/2018: TEE WITHOUT CARDIOVERSION; N/A     Comment:  Procedure: TRANSESOPHAGEAL ECHOCARDIOGRAM (TEE);                Surgeon: Corey Skains, MD;  Location: [facility] ORS;                Service: Cardiovascular;  Laterality: N/A; No date: TOE SURGERY  BMI    Body Mass Index: 31.62 kg/m      Reproductive/Obstetrics negative OB ROS                           Anesthesia Physical Anesthesia Plan  ASA: 3  Anesthesia Plan: General   Post-op Pain Management:    Induction: Intravenous  PONV Risk Score and Plan: Propofol infusion and TIVA  Airway Management Planned: Natural Airway and Nasal Cannula  Additional Equipment:   Intra-op Plan:   Post-operative Plan:   Informed Consent: I have reviewed the patients History and Physical, chart, labs and discussed the procedure including the risks, benefits and alternatives for the proposed anesthesia with the patient or authorized  representative who has indicated his/her understanding and acceptance.     Dental Advisory Given  Plan Discussed with: Anesthesiologist, CRNA and Surgeon  Anesthesia Plan Comments: (Patient consented for risks of anesthesia including but not limited to:  - adverse reactions to medications - risk of airway placement if required - damage to eyes, teeth, lips or other oral mucosa - nerve damage due to positioning  - sore throat or hoarseness - Damage to heart, brain, nerves, lungs, other parts of body or loss of life  Patient voiced understanding.)        Anesthesia Quick Evaluation

## 2021-06-19 NOTE — Anesthesia Postprocedure Evaluation (Signed)
Anesthesia Post Note  Patient: Stephanie [name]  Procedure(s) Performed: CARDIOVERSION  Patient location during evaluation: Specials Recovery Anesthesia Type: General Level of consciousness: awake and alert Pain management: pain level controlled Vital Signs Assessment: post-procedure vital signs reviewed and stable Respiratory status: spontaneous breathing, nonlabored ventilation, respiratory function stable and patient connected to nasal cannula oxygen Cardiovascular status: blood pressure returned to baseline and stable Postop Assessment: no apparent nausea or vomiting Anesthetic complications: no   No notable events documented.   Last Vitals:  Vitals:   06/19/21 0815 06/19/21 0830  BP: 127/70 134/77  Pulse: 63 62  Resp: (!) 21 20  Temp:    SpO2: 99% 98%    Last Pain:  Vitals:   06/19/21 0830  TempSrc:   PainSc: 0-No pain                 Precious Haws Cleaster Shiffer

## 2021-07-09 DIAGNOSIS — I10 Essential (primary) hypertension: Secondary | ICD-10-CM | POA: Diagnosis not present

## 2021-07-09 DIAGNOSIS — R0602 Shortness of breath: Secondary | ICD-10-CM | POA: Diagnosis not present

## 2021-07-09 DIAGNOSIS — I872 Venous insufficiency (chronic) (peripheral): Secondary | ICD-10-CM | POA: Diagnosis not present

## 2021-07-09 DIAGNOSIS — R001 Bradycardia, unspecified: Secondary | ICD-10-CM | POA: Diagnosis not present

## 2021-07-09 DIAGNOSIS — Z87891 Personal history of nicotine dependence: Secondary | ICD-10-CM | POA: Diagnosis not present

## 2021-07-09 DIAGNOSIS — R42 Dizziness and giddiness: Secondary | ICD-10-CM | POA: Diagnosis not present

## 2021-07-09 DIAGNOSIS — E782 Mixed hyperlipidemia: Secondary | ICD-10-CM | POA: Diagnosis not present

## 2021-07-09 DIAGNOSIS — J439 Emphysema, unspecified: Secondary | ICD-10-CM | POA: Diagnosis not present

## 2021-07-09 DIAGNOSIS — I4891 Unspecified atrial fibrillation: Secondary | ICD-10-CM | POA: Diagnosis not present

## 2021-07-14 DIAGNOSIS — M47817 Spondylosis without myelopathy or radiculopathy, lumbosacral region: Secondary | ICD-10-CM | POA: Diagnosis not present

## 2021-08-05 DIAGNOSIS — Z1231 Encounter for screening mammogram for malignant neoplasm of breast: Secondary | ICD-10-CM | POA: Diagnosis not present

## 2021-08-05 DIAGNOSIS — G2581 Restless legs syndrome: Secondary | ICD-10-CM | POA: Diagnosis not present

## 2021-08-05 DIAGNOSIS — R197 Diarrhea, unspecified: Secondary | ICD-10-CM | POA: Diagnosis not present

## 2021-08-05 DIAGNOSIS — I480 Paroxysmal atrial fibrillation: Secondary | ICD-10-CM | POA: Diagnosis not present

## 2021-08-05 DIAGNOSIS — R61 Generalized hyperhidrosis: Secondary | ICD-10-CM | POA: Diagnosis not present

## 2021-08-05 DIAGNOSIS — F325 Major depressive disorder, single episode, in full remission: Secondary | ICD-10-CM | POA: Diagnosis not present

## 2021-08-05 DIAGNOSIS — R0683 Snoring: Secondary | ICD-10-CM | POA: Diagnosis not present

## 2021-08-05 DIAGNOSIS — I10 Essential (primary) hypertension: Secondary | ICD-10-CM | POA: Diagnosis not present

## 2021-08-12 DIAGNOSIS — F17210 Nicotine dependence, cigarettes, uncomplicated: Secondary | ICD-10-CM | POA: Diagnosis not present

## 2021-08-12 DIAGNOSIS — I10 Essential (primary) hypertension: Secondary | ICD-10-CM | POA: Diagnosis not present

## 2021-08-12 DIAGNOSIS — Z1389 Encounter for screening for other disorder: Secondary | ICD-10-CM | POA: Diagnosis not present

## 2021-08-12 DIAGNOSIS — I480 Paroxysmal atrial fibrillation: Secondary | ICD-10-CM | POA: Diagnosis not present

## 2021-08-12 DIAGNOSIS — Z79899 Other long term (current) drug therapy: Secondary | ICD-10-CM | POA: Diagnosis not present

## 2021-08-12 DIAGNOSIS — Z0000 Encounter for general adult medical examination without abnormal findings: Secondary | ICD-10-CM | POA: Diagnosis not present

## 2021-08-12 DIAGNOSIS — R61 Generalized hyperhidrosis: Secondary | ICD-10-CM | POA: Diagnosis not present

## 2021-08-12 DIAGNOSIS — F325 Major depressive disorder, single episode, in full remission: Secondary | ICD-10-CM | POA: Diagnosis not present

## 2021-08-12 DIAGNOSIS — R195 Other fecal abnormalities: Secondary | ICD-10-CM | POA: Diagnosis not present

## 2021-08-13 DIAGNOSIS — M47817 Spondylosis without myelopathy or radiculopathy, lumbosacral region: Secondary | ICD-10-CM | POA: Diagnosis not present

## 2021-08-25 DIAGNOSIS — M47817 Spondylosis without myelopathy or radiculopathy, lumbosacral region: Secondary | ICD-10-CM | POA: Diagnosis not present

## 2021-08-25 DIAGNOSIS — M791 Myalgia, unspecified site: Secondary | ICD-10-CM | POA: Diagnosis not present

## 2021-09-17 DIAGNOSIS — M47817 Spondylosis without myelopathy or radiculopathy, lumbosacral region: Secondary | ICD-10-CM | POA: Diagnosis not present

## 2021-09-20 DIAGNOSIS — G4733 Obstructive sleep apnea (adult) (pediatric): Secondary | ICD-10-CM | POA: Diagnosis not present

## 2021-10-28 DIAGNOSIS — M791 Myalgia, unspecified site: Secondary | ICD-10-CM | POA: Diagnosis not present

## 2021-10-28 DIAGNOSIS — M461 Sacroiliitis, not elsewhere classified: Secondary | ICD-10-CM | POA: Diagnosis not present

## 2021-10-28 DIAGNOSIS — M4316 Spondylolisthesis, lumbar region: Secondary | ICD-10-CM | POA: Diagnosis not present

## 2021-10-29 DIAGNOSIS — J439 Emphysema, unspecified: Secondary | ICD-10-CM | POA: Diagnosis not present

## 2021-10-29 DIAGNOSIS — G4733 Obstructive sleep apnea (adult) (pediatric): Secondary | ICD-10-CM | POA: Diagnosis not present

## 2021-10-29 DIAGNOSIS — E782 Mixed hyperlipidemia: Secondary | ICD-10-CM | POA: Diagnosis not present

## 2021-10-29 DIAGNOSIS — I4891 Unspecified atrial fibrillation: Secondary | ICD-10-CM | POA: Diagnosis not present

## 2021-10-29 DIAGNOSIS — I5022 Chronic systolic (congestive) heart failure: Secondary | ICD-10-CM | POA: Diagnosis not present

## 2021-10-29 DIAGNOSIS — I872 Venous insufficiency (chronic) (peripheral): Secondary | ICD-10-CM | POA: Diagnosis not present

## 2021-10-29 DIAGNOSIS — I10 Essential (primary) hypertension: Secondary | ICD-10-CM | POA: Diagnosis not present

## 2021-10-29 DIAGNOSIS — Z87891 Personal history of nicotine dependence: Secondary | ICD-10-CM | POA: Diagnosis not present

## 2021-11-05 DIAGNOSIS — M461 Sacroiliitis, not elsewhere classified: Secondary | ICD-10-CM | POA: Diagnosis not present

## 2021-11-20 DIAGNOSIS — M461 Sacroiliitis, not elsewhere classified: Secondary | ICD-10-CM | POA: Diagnosis not present

## 2021-11-20 DIAGNOSIS — M4316 Spondylolisthesis, lumbar region: Secondary | ICD-10-CM | POA: Diagnosis not present

## 2021-11-20 DIAGNOSIS — M791 Myalgia, unspecified site: Secondary | ICD-10-CM | POA: Diagnosis not present

## 2021-12-23 DIAGNOSIS — M5416 Radiculopathy, lumbar region: Secondary | ICD-10-CM | POA: Diagnosis not present

## 2021-12-23 DIAGNOSIS — M4316 Spondylolisthesis, lumbar region: Secondary | ICD-10-CM | POA: Diagnosis not present

## 2021-12-23 DIAGNOSIS — M791 Myalgia, unspecified site: Secondary | ICD-10-CM | POA: Diagnosis not present

## 2022-02-04 DIAGNOSIS — M5416 Radiculopathy, lumbar region: Secondary | ICD-10-CM | POA: Diagnosis not present

## 2022-02-05 DIAGNOSIS — F325 Major depressive disorder, single episode, in full remission: Secondary | ICD-10-CM | POA: Diagnosis not present

## 2022-02-05 DIAGNOSIS — I480 Paroxysmal atrial fibrillation: Secondary | ICD-10-CM | POA: Diagnosis not present

## 2022-02-05 DIAGNOSIS — Z720 Tobacco use: Secondary | ICD-10-CM | POA: Diagnosis not present

## 2022-02-05 DIAGNOSIS — R8290 Unspecified abnormal findings in urine: Secondary | ICD-10-CM | POA: Diagnosis not present

## 2022-02-05 DIAGNOSIS — K580 Irritable bowel syndrome with diarrhea: Secondary | ICD-10-CM | POA: Diagnosis not present

## 2022-02-05 DIAGNOSIS — E782 Mixed hyperlipidemia: Secondary | ICD-10-CM | POA: Diagnosis not present

## 2022-02-05 DIAGNOSIS — I872 Venous insufficiency (chronic) (peripheral): Secondary | ICD-10-CM | POA: Diagnosis not present

## 2022-02-05 DIAGNOSIS — R61 Generalized hyperhidrosis: Secondary | ICD-10-CM | POA: Diagnosis not present

## 2022-02-05 DIAGNOSIS — I5022 Chronic systolic (congestive) heart failure: Secondary | ICD-10-CM | POA: Diagnosis not present

## 2022-02-05 DIAGNOSIS — Z0000 Encounter for general adult medical examination without abnormal findings: Secondary | ICD-10-CM | POA: Diagnosis not present

## 2022-02-05 DIAGNOSIS — I10 Essential (primary) hypertension: Secondary | ICD-10-CM | POA: Diagnosis not present

## 2022-02-05 DIAGNOSIS — J439 Emphysema, unspecified: Secondary | ICD-10-CM | POA: Diagnosis not present

## 2022-02-12 DIAGNOSIS — M545 Low back pain, unspecified: Secondary | ICD-10-CM | POA: Diagnosis not present

## 2022-02-12 DIAGNOSIS — G4733 Obstructive sleep apnea (adult) (pediatric): Secondary | ICD-10-CM | POA: Diagnosis not present

## 2022-02-12 DIAGNOSIS — Z0000 Encounter for general adult medical examination without abnormal findings: Secondary | ICD-10-CM | POA: Diagnosis not present

## 2022-02-12 DIAGNOSIS — Z1231 Encounter for screening mammogram for malignant neoplasm of breast: Secondary | ICD-10-CM | POA: Diagnosis not present

## 2022-02-12 DIAGNOSIS — Z6830 Body mass index (BMI) 30.0-30.9, adult: Secondary | ICD-10-CM | POA: Diagnosis not present

## 2022-02-12 DIAGNOSIS — I10 Essential (primary) hypertension: Secondary | ICD-10-CM | POA: Diagnosis not present

## 2022-02-12 DIAGNOSIS — I480 Paroxysmal atrial fibrillation: Secondary | ICD-10-CM | POA: Diagnosis not present

## 2022-02-12 DIAGNOSIS — F17210 Nicotine dependence, cigarettes, uncomplicated: Secondary | ICD-10-CM | POA: Diagnosis not present

## 2022-02-12 DIAGNOSIS — R413 Other amnesia: Secondary | ICD-10-CM | POA: Diagnosis not present

## 2022-02-13 ENCOUNTER — Other Ambulatory Visit: Payer: Self-pay | Admitting: Internal Medicine

## 2022-02-13 DIAGNOSIS — Z1231 Encounter for screening mammogram for malignant neoplasm of breast: Secondary | ICD-10-CM

## 2022-02-13 DIAGNOSIS — I480 Paroxysmal atrial fibrillation: Secondary | ICD-10-CM | POA: Diagnosis not present

## 2022-03-12 ENCOUNTER — Ambulatory Visit
Admission: RE | Admit: 2022-03-12 | Discharge: 2022-03-12 | Disposition: A | Discharge status: Home / Self Care | Payer: Medicare HMO | Source: Ambulatory Visit | Attending: Internal Medicine | Admitting: Internal Medicine

## 2022-03-12 DIAGNOSIS — Z1231 Encounter for screening mammogram for malignant neoplasm of breast: Secondary | ICD-10-CM | POA: Diagnosis not present

## 2022-04-24 DIAGNOSIS — I480 Paroxysmal atrial fibrillation: Secondary | ICD-10-CM | POA: Diagnosis not present

## 2022-05-04 DIAGNOSIS — I480 Paroxysmal atrial fibrillation: Secondary | ICD-10-CM | POA: Diagnosis not present

## 2022-05-07 DIAGNOSIS — J439 Emphysema, unspecified: Secondary | ICD-10-CM | POA: Diagnosis not present

## 2022-05-07 DIAGNOSIS — R9439 Abnormal result of other cardiovascular function study: Secondary | ICD-10-CM | POA: Diagnosis not present

## 2022-05-07 DIAGNOSIS — I480 Paroxysmal atrial fibrillation: Secondary | ICD-10-CM | POA: Diagnosis not present

## 2022-05-07 DIAGNOSIS — Z720 Tobacco use: Secondary | ICD-10-CM | POA: Diagnosis not present

## 2022-05-07 DIAGNOSIS — I208 Other forms of angina pectoris: Secondary | ICD-10-CM | POA: Diagnosis not present

## 2022-05-07 DIAGNOSIS — I10 Essential (primary) hypertension: Secondary | ICD-10-CM | POA: Diagnosis not present

## 2022-05-07 DIAGNOSIS — I5022 Chronic systolic (congestive) heart failure: Secondary | ICD-10-CM | POA: Diagnosis not present

## 2022-05-07 DIAGNOSIS — I872 Venous insufficiency (chronic) (peripheral): Secondary | ICD-10-CM | POA: Diagnosis not present

## 2022-05-07 DIAGNOSIS — E782 Mixed hyperlipidemia: Secondary | ICD-10-CM | POA: Diagnosis not present

## 2022-05-08 ENCOUNTER — Other Ambulatory Visit: Payer: Self-pay | Admitting: Internal Medicine

## 2022-05-08 DIAGNOSIS — R9439 Abnormal result of other cardiovascular function study: Secondary | ICD-10-CM

## 2022-05-13 ENCOUNTER — Telehealth (HOSPITAL_COMMUNITY): Payer: Self-pay | Admitting: Emergency Medicine

## 2022-05-13 ENCOUNTER — Encounter (HOSPITAL_COMMUNITY): Payer: Self-pay

## 2022-05-13 ENCOUNTER — Other Ambulatory Visit (HOSPITAL_COMMUNITY): Payer: Self-pay | Admitting: Emergency Medicine

## 2022-05-13 DIAGNOSIS — R079 Chest pain, unspecified: Secondary | ICD-10-CM

## 2022-05-13 MED ORDER — METOPROLOL TARTRATE 100 MG PO TABS: 100.0000 mg | ORAL_TABLET | Freq: Once | ORAL | 0 refills | Status: DC

## 2022-05-13 NOTE — Telephone Encounter (Signed)
Reaching out to patient to offer assistance regarding upcoming cardiac imaging study; pt verbalizes understanding of appt date/time, parking situation and where to check in, pre-test NPO status and medications ordered, and verified current allergies; name and call back number provided for further questions should they arise Stephanie Bond RN Navigator Cardiac Imaging [name] [phone] office [phone] cell  She doesn't know when shes in afib or not. she knows she feels like crap when shes in afib and currently she doesn't feel like crap. so its possible shes not in afib. so I offered her to come in and TRY and shes good with that plan . I explained that we dont want to expose her to contrast and radiation if it will produce nondiagnostic images but also want to give it a good ole [employer] try. so I sent in an additional '100mg'$  metoprolol for her to take 2 hr prior to scan (along with other daily meds). She knows that if shes in afib on arrival, we might turn her away.   Stephanie [name]

## 2022-05-14 ENCOUNTER — Ambulatory Visit: Admission: RE | Admit: 2022-05-14 | Payer: Medicare HMO | Source: Ambulatory Visit

## 2022-06-03 DIAGNOSIS — E782 Mixed hyperlipidemia: Secondary | ICD-10-CM | POA: Diagnosis not present

## 2022-06-03 DIAGNOSIS — I110 Hypertensive heart disease with heart failure: Secondary | ICD-10-CM | POA: Diagnosis not present

## 2022-06-03 DIAGNOSIS — F419 Anxiety disorder, unspecified: Secondary | ICD-10-CM | POA: Diagnosis not present

## 2022-06-03 DIAGNOSIS — I872 Venous insufficiency (chronic) (peripheral): Secondary | ICD-10-CM | POA: Diagnosis not present

## 2022-06-03 DIAGNOSIS — Z01818 Encounter for other preprocedural examination: Secondary | ICD-10-CM | POA: Diagnosis not present

## 2022-06-03 DIAGNOSIS — F32A Depression, unspecified: Secondary | ICD-10-CM | POA: Diagnosis not present

## 2022-06-03 DIAGNOSIS — I5021 Acute systolic (congestive) heart failure: Secondary | ICD-10-CM | POA: Diagnosis not present

## 2022-06-03 DIAGNOSIS — I480 Paroxysmal atrial fibrillation: Secondary | ICD-10-CM | POA: Diagnosis not present

## 2022-06-03 DIAGNOSIS — E559 Vitamin D deficiency, unspecified: Secondary | ICD-10-CM | POA: Diagnosis not present

## 2022-06-09 DIAGNOSIS — Z7901 Long term (current) use of anticoagulants: Secondary | ICD-10-CM | POA: Diagnosis not present

## 2022-06-09 DIAGNOSIS — M1990 Unspecified osteoarthritis, unspecified site: Secondary | ICD-10-CM | POA: Diagnosis not present

## 2022-06-09 DIAGNOSIS — Z8719 Personal history of other diseases of the digestive system: Secondary | ICD-10-CM | POA: Diagnosis not present

## 2022-06-09 DIAGNOSIS — I5020 Unspecified systolic (congestive) heart failure: Secondary | ICD-10-CM | POA: Diagnosis not present

## 2022-06-09 DIAGNOSIS — Z95818 Presence of other cardiac implants and grafts: Secondary | ICD-10-CM | POA: Diagnosis not present

## 2022-06-09 DIAGNOSIS — I110 Hypertensive heart disease with heart failure: Secondary | ICD-10-CM | POA: Diagnosis not present

## 2022-06-09 DIAGNOSIS — G2581 Restless legs syndrome: Secondary | ICD-10-CM | POA: Diagnosis not present

## 2022-06-09 DIAGNOSIS — J439 Emphysema, unspecified: Secondary | ICD-10-CM | POA: Diagnosis not present

## 2022-06-09 DIAGNOSIS — E559 Vitamin D deficiency, unspecified: Secondary | ICD-10-CM | POA: Diagnosis not present

## 2022-06-09 DIAGNOSIS — I480 Paroxysmal atrial fibrillation: Secondary | ICD-10-CM | POA: Diagnosis not present

## 2022-06-09 DIAGNOSIS — I5021 Acute systolic (congestive) heart failure: Secondary | ICD-10-CM | POA: Diagnosis not present

## 2022-06-09 DIAGNOSIS — F418 Other specified anxiety disorders: Secondary | ICD-10-CM | POA: Diagnosis not present

## 2022-06-09 DIAGNOSIS — Z006 Encounter for examination for normal comparison and control in clinical research program: Secondary | ICD-10-CM | POA: Diagnosis not present

## 2022-06-09 DIAGNOSIS — G8929 Other chronic pain: Secondary | ICD-10-CM | POA: Diagnosis not present

## 2022-06-09 DIAGNOSIS — E782 Mixed hyperlipidemia: Secondary | ICD-10-CM | POA: Diagnosis not present

## 2022-06-10 DIAGNOSIS — I5020 Unspecified systolic (congestive) heart failure: Secondary | ICD-10-CM | POA: Diagnosis not present

## 2022-06-10 DIAGNOSIS — F418 Other specified anxiety disorders: Secondary | ICD-10-CM | POA: Diagnosis not present

## 2022-06-10 DIAGNOSIS — I480 Paroxysmal atrial fibrillation: Secondary | ICD-10-CM | POA: Diagnosis not present

## 2022-06-10 DIAGNOSIS — M1990 Unspecified osteoarthritis, unspecified site: Secondary | ICD-10-CM | POA: Diagnosis not present

## 2022-06-10 DIAGNOSIS — I110 Hypertensive heart disease with heart failure: Secondary | ICD-10-CM | POA: Diagnosis not present

## 2022-06-15 DIAGNOSIS — I700 Atherosclerosis of aorta: Secondary | ICD-10-CM | POA: Diagnosis not present

## 2022-06-15 DIAGNOSIS — I480 Paroxysmal atrial fibrillation: Secondary | ICD-10-CM | POA: Diagnosis not present

## 2022-06-15 DIAGNOSIS — Z95818 Presence of other cardiac implants and grafts: Secondary | ICD-10-CM | POA: Diagnosis not present

## 2022-06-15 DIAGNOSIS — F17210 Nicotine dependence, cigarettes, uncomplicated: Secondary | ICD-10-CM | POA: Diagnosis not present

## 2022-06-15 DIAGNOSIS — J449 Chronic obstructive pulmonary disease, unspecified: Secondary | ICD-10-CM | POA: Diagnosis not present

## 2022-06-15 DIAGNOSIS — Z09 Encounter for follow-up examination after completed treatment for conditions other than malignant neoplasm: Secondary | ICD-10-CM | POA: Diagnosis not present

## 2022-06-15 DIAGNOSIS — E041 Nontoxic single thyroid nodule: Secondary | ICD-10-CM | POA: Diagnosis not present

## 2022-06-15 DIAGNOSIS — F325 Major depressive disorder, single episode, in full remission: Secondary | ICD-10-CM | POA: Diagnosis not present

## 2022-06-17 DIAGNOSIS — I5022 Chronic systolic (congestive) heart failure: Secondary | ICD-10-CM | POA: Diagnosis not present

## 2022-06-17 DIAGNOSIS — I208 Other forms of angina pectoris: Secondary | ICD-10-CM | POA: Diagnosis not present

## 2022-06-17 DIAGNOSIS — J439 Emphysema, unspecified: Secondary | ICD-10-CM | POA: Diagnosis not present

## 2022-06-17 DIAGNOSIS — E782 Mixed hyperlipidemia: Secondary | ICD-10-CM | POA: Diagnosis not present

## 2022-06-17 DIAGNOSIS — Z95818 Presence of other cardiac implants and grafts: Secondary | ICD-10-CM | POA: Diagnosis not present

## 2022-06-17 DIAGNOSIS — I480 Paroxysmal atrial fibrillation: Secondary | ICD-10-CM | POA: Diagnosis not present

## 2022-06-17 DIAGNOSIS — R9439 Abnormal result of other cardiovascular function study: Secondary | ICD-10-CM | POA: Diagnosis not present

## 2022-06-17 DIAGNOSIS — Z720 Tobacco use: Secondary | ICD-10-CM | POA: Diagnosis not present

## 2022-06-17 DIAGNOSIS — I10 Essential (primary) hypertension: Secondary | ICD-10-CM | POA: Diagnosis not present

## 2022-07-02 ENCOUNTER — Encounter: Payer: Self-pay | Admitting: Emergency Medicine

## 2022-07-02 ENCOUNTER — Emergency Department: Payer: Medicare HMO

## 2022-07-02 ENCOUNTER — Other Ambulatory Visit: Payer: Self-pay

## 2022-07-02 ENCOUNTER — Other Ambulatory Visit: Payer: Self-pay | Admitting: Family Medicine

## 2022-07-02 ENCOUNTER — Observation Stay
Admission: EM | Admit: 2022-07-02 | Discharge: 2022-07-03 | Disposition: A | Discharge status: Home / Self Care | Payer: Medicare HMO | Attending: Internal Medicine | Admitting: Internal Medicine

## 2022-07-02 ENCOUNTER — Observation Stay: Payer: Medicare HMO

## 2022-07-02 ENCOUNTER — Emergency Department
Admission: RE | Admit: 2022-07-02 | Discharge: 2022-07-02 | Disposition: A | Discharge status: Home / Self Care | Payer: Medicare HMO | Source: Ambulatory Visit | Attending: Family Medicine | Admitting: Family Medicine

## 2022-07-02 DIAGNOSIS — R109 Unspecified abdominal pain: Secondary | ICD-10-CM

## 2022-07-02 DIAGNOSIS — R2681 Unsteadiness on feet: Secondary | ICD-10-CM | POA: Insufficient documentation

## 2022-07-02 DIAGNOSIS — I5021 Acute systolic (congestive) heart failure: Secondary | ICD-10-CM | POA: Diagnosis present

## 2022-07-02 DIAGNOSIS — G44201 Tension-type headache, unspecified, intractable: Secondary | ICD-10-CM

## 2022-07-02 DIAGNOSIS — Z7901 Long term (current) use of anticoagulants: Secondary | ICD-10-CM | POA: Diagnosis not present

## 2022-07-02 DIAGNOSIS — R410 Disorientation, unspecified: Secondary | ICD-10-CM | POA: Diagnosis not present

## 2022-07-02 DIAGNOSIS — Z9181 History of falling: Secondary | ICD-10-CM | POA: Insufficient documentation

## 2022-07-02 DIAGNOSIS — I480 Paroxysmal atrial fibrillation: Secondary | ICD-10-CM | POA: Diagnosis not present

## 2022-07-02 DIAGNOSIS — R112 Nausea with vomiting, unspecified: Secondary | ICD-10-CM

## 2022-07-02 DIAGNOSIS — Z20822 Contact with and (suspected) exposure to covid-19: Secondary | ICD-10-CM | POA: Insufficient documentation

## 2022-07-02 DIAGNOSIS — G9340 Encephalopathy, unspecified: Secondary | ICD-10-CM | POA: Diagnosis not present

## 2022-07-02 DIAGNOSIS — I110 Hypertensive heart disease with heart failure: Secondary | ICD-10-CM | POA: Insufficient documentation

## 2022-07-02 DIAGNOSIS — I4891 Unspecified atrial fibrillation: Secondary | ICD-10-CM | POA: Diagnosis not present

## 2022-07-02 DIAGNOSIS — Z87891 Personal history of nicotine dependence: Secondary | ICD-10-CM | POA: Diagnosis not present

## 2022-07-02 DIAGNOSIS — R29818 Other symptoms and signs involving the nervous system: Secondary | ICD-10-CM | POA: Diagnosis not present

## 2022-07-02 DIAGNOSIS — E0510 Thyrotoxicosis with toxic single thyroid nodule without thyrotoxic crisis or storm: Secondary | ICD-10-CM | POA: Insufficient documentation

## 2022-07-02 DIAGNOSIS — R1011 Right upper quadrant pain: Secondary | ICD-10-CM | POA: Insufficient documentation

## 2022-07-02 DIAGNOSIS — I10 Essential (primary) hypertension: Secondary | ICD-10-CM | POA: Diagnosis present

## 2022-07-02 DIAGNOSIS — R4182 Altered mental status, unspecified: Secondary | ICD-10-CM | POA: Diagnosis present

## 2022-07-02 DIAGNOSIS — Z79899 Other long term (current) drug therapy: Secondary | ICD-10-CM | POA: Insufficient documentation

## 2022-07-02 DIAGNOSIS — F17200 Nicotine dependence, unspecified, uncomplicated: Secondary | ICD-10-CM | POA: Diagnosis present

## 2022-07-02 DIAGNOSIS — I4581 Long QT syndrome: Secondary | ICD-10-CM | POA: Diagnosis not present

## 2022-07-02 DIAGNOSIS — E876 Hypokalemia: Secondary | ICD-10-CM | POA: Diagnosis not present

## 2022-07-02 DIAGNOSIS — R5383 Other fatigue: Secondary | ICD-10-CM | POA: Diagnosis not present

## 2022-07-02 DIAGNOSIS — Z03818 Encounter for observation for suspected exposure to other biological agents ruled out: Secondary | ICD-10-CM | POA: Diagnosis not present

## 2022-07-02 DIAGNOSIS — K5730 Diverticulosis of large intestine without perforation or abscess without bleeding: Secondary | ICD-10-CM | POA: Diagnosis not present

## 2022-07-02 DIAGNOSIS — R519 Headache, unspecified: Secondary | ICD-10-CM | POA: Diagnosis not present

## 2022-07-02 DIAGNOSIS — K529 Noninfective gastroenteritis and colitis, unspecified: Secondary | ICD-10-CM

## 2022-07-02 DIAGNOSIS — G454 Transient global amnesia: Secondary | ICD-10-CM | POA: Diagnosis not present

## 2022-07-02 DIAGNOSIS — E041 Nontoxic single thyroid nodule: Secondary | ICD-10-CM

## 2022-07-02 DIAGNOSIS — Z720 Tobacco use: Secondary | ICD-10-CM | POA: Diagnosis present

## 2022-07-02 DIAGNOSIS — R9431 Abnormal electrocardiogram [ECG] [EKG]: Secondary | ICD-10-CM

## 2022-07-02 LAB — URINE DRUG SCREEN, QUALITATIVE (ARMC ONLY)
Amphetamines, Ur Screen: NOT DETECTED
Barbiturates, Ur Screen: NOT DETECTED
Benzodiazepine, Ur Scrn: NOT DETECTED
Cannabinoid 50 Ng, Ur ~~LOC~~: NOT DETECTED
Cocaine Metabolite,Ur ~~LOC~~: NOT DETECTED
MDMA (Ecstasy)Ur Screen: NOT DETECTED
Methadone Scn, Ur: NOT DETECTED
Opiate, Ur Screen: NOT DETECTED
Phencyclidine (PCP) Ur S: NOT DETECTED
Tricyclic, Ur Screen: NOT DETECTED

## 2022-07-02 LAB — DIFFERENTIAL
Abs Immature Granulocytes: 0.02 10*3/uL (ref 0.00–0.07)
Basophils Absolute: 0 10*3/uL (ref 0.0–0.1)
Basophils Relative: 1 %
Eosinophils Absolute: 0 10*3/uL (ref 0.0–0.5)
Eosinophils Relative: 0 %
Immature Granulocytes: 0 %
Lymphocytes Relative: 20 %
Lymphs Abs: 1.3 10*3/uL (ref 0.7–4.0)
Monocytes Absolute: 0.3 10*3/uL (ref 0.1–1.0)
Monocytes Relative: 5 %
Neutro Abs: 4.7 10*3/uL (ref 1.7–7.7)
Neutrophils Relative %: 74 %

## 2022-07-02 LAB — COMPREHENSIVE METABOLIC PANEL
ALT: 14 U/L (ref 0–44)
AST: 20 U/L (ref 15–41)
Albumin: 3.3 g/dL — ABNORMAL LOW (ref 3.5–5.0)
Alkaline Phosphatase: 76 U/L (ref 38–126)
Anion gap: 11 (ref 5–15)
BUN: 13 mg/dL (ref 8–23)
CO2: 28 mmol/L (ref 22–32)
Calcium: 8.6 mg/dL — ABNORMAL LOW (ref 8.9–10.3)
Chloride: 93 mmol/L — ABNORMAL LOW (ref 98–111)
Creatinine, Ser: 0.96 mg/dL (ref 0.44–1.00)
GFR, Estimated: >60 mL/min (ref >60)
Glucose, Bld: 94 mg/dL (ref 70–99)
Potassium: 3 mmol/L — ABNORMAL LOW (ref 3.5–5.1)
Sodium: 132 mmol/L — ABNORMAL LOW (ref 135–145)
Total Bilirubin: 0.6 mg/dL (ref 0.3–1.2)
Total Protein: 6.6 g/dL (ref 6.5–8.1)

## 2022-07-02 LAB — CBC
HCT: 37.9 % (ref 36.0–46.0)
Hemoglobin: 12.7 g/dL (ref 12.0–15.0)
MCH: 30.6 pg (ref 26.0–34.0)
MCHC: 33.5 g/dL (ref 30.0–36.0)
MCV: 91.3 fL (ref 80.0–100.0)
Platelets: 198 10*3/uL (ref 150–400)
RBC: 4.15 MIL/uL (ref 3.87–5.11)
RDW: 12.7 % (ref 11.5–15.5)
WBC: 6.4 10*3/uL (ref 4.0–10.5)
nRBC: 0 % (ref 0.0–0.2)

## 2022-07-02 LAB — T4, FREE: Free T4: 1.85 ng/dL — ABNORMAL HIGH (ref 0.61–1.12)

## 2022-07-02 LAB — URINALYSIS, ROUTINE W REFLEX MICROSCOPIC
Bilirubin Urine: NEGATIVE
Glucose, UA: NEGATIVE mg/dL
Hgb urine dipstick: NEGATIVE
Ketones, ur: NEGATIVE mg/dL
Leukocytes,Ua: NEGATIVE
Nitrite: NEGATIVE
Protein, ur: NEGATIVE mg/dL
Specific Gravity, Urine: 1.031 — ABNORMAL HIGH (ref 1.005–1.030)
pH: 7 (ref 5.0–8.0)

## 2022-07-02 LAB — TSH: TSH: 0.017 u[IU]/mL — ABNORMAL LOW (ref 0.350–4.500)

## 2022-07-02 LAB — LIPASE, BLOOD: Lipase: 27 U/L (ref 11–51)

## 2022-07-02 LAB — HEMOGLOBIN A1C
Hgb A1c MFr Bld: 5 % (ref 4.8–5.6)
Mean Plasma Glucose: 96.8 mg/dL

## 2022-07-02 LAB — APTT: aPTT: 32 seconds (ref 24–36)

## 2022-07-02 LAB — PROTIME-INR
INR: 1.4 — ABNORMAL HIGH (ref 0.8–1.2)
Prothrombin Time: 16.7 seconds — ABNORMAL HIGH (ref 11.4–15.2)

## 2022-07-02 LAB — CBG MONITORING, ED: Glucose-Capillary: 109 mg/dL — ABNORMAL HIGH (ref 70–99)

## 2022-07-02 LAB — ETHANOL: Alcohol, Ethyl (B): <10 mg/dL (ref <10)

## 2022-07-02 MED ORDER — POTASSIUM CHLORIDE CRYS ER 20 MEQ PO TBCR
40.0000 meq | EXTENDED_RELEASE_TABLET | Freq: Two times a day (BID) | ORAL | Status: AC
  Administered 2022-07-02 – 2022-07-03 (×2): 40 meq via ORAL
  Filled 2022-07-02 (×2): qty 2

## 2022-07-02 MED ORDER — THIAMINE MONONITRATE 100 MG PO TABS
100.0000 mg | ORAL_TABLET | Freq: Every day | ORAL | Status: DC
  Administered 2022-07-02 – 2022-07-03 (×2): 100 mg via ORAL
  Filled 2022-07-02 (×2): qty 1

## 2022-07-02 MED ORDER — ONDANSETRON HCL 4 MG PO TABS: 4.0000 mg | ORAL_TABLET | Freq: Four times a day (QID) | ORAL | Status: DC | PRN

## 2022-07-02 MED ORDER — SODIUM CHLORIDE 0.9% FLUSH
3.0000 mL | Freq: Two times a day (BID) | INTRAVENOUS | Status: DC
  Administered 2022-07-02 – 2022-07-03 (×2): 3 mL via INTRAVENOUS

## 2022-07-02 MED ORDER — ADULT MULTIVITAMIN W/MINERALS CH
1.0000 | ORAL_TABLET | Freq: Every day | ORAL | Status: DC
  Administered 2022-07-02 – 2022-07-03 (×2): 1 via ORAL
  Filled 2022-07-02 (×2): qty 1

## 2022-07-02 MED ORDER — SERTRALINE HCL 50 MG PO TABS
50.0000 mg | ORAL_TABLET | Freq: Every day | ORAL | Status: DC
  Administered 2022-07-02: 50 mg via ORAL
  Filled 2022-07-02 (×2): qty 1

## 2022-07-02 MED ORDER — HYDROCHLOROTHIAZIDE 25 MG PO TABS
25.0000 mg | ORAL_TABLET | Freq: Every day | ORAL | Status: DC
  Administered 2022-07-03: 25 mg via ORAL
  Filled 2022-07-02: qty 1

## 2022-07-02 MED ORDER — ACETAMINOPHEN 325 MG PO TABS: 650.0000 mg | ORAL_TABLET | Freq: Four times a day (QID) | ORAL | Status: DC | PRN

## 2022-07-02 MED ORDER — APIXABAN 5 MG PO TABS
5.0000 mg | ORAL_TABLET | Freq: Two times a day (BID) | ORAL | Status: DC
  Administered 2022-07-02: 5 mg via ORAL
  Filled 2022-07-02 (×2): qty 1

## 2022-07-02 MED ORDER — STROKE: EARLY STAGES OF RECOVERY BOOK: Freq: Once | Status: AC

## 2022-07-02 MED ORDER — LACTATED RINGERS IV BOLUS
1000.0000 mL | Freq: Once | INTRAVENOUS | Status: AC
  Administered 2022-07-02: 1000 mL via INTRAVENOUS

## 2022-07-02 MED ORDER — ACETAMINOPHEN 650 MG RE SUPP: 650.0000 mg | Freq: Four times a day (QID) | RECTAL | Status: DC | PRN

## 2022-07-02 MED ORDER — FOLIC ACID 1 MG PO TABS
1.0000 mg | ORAL_TABLET | Freq: Every day | ORAL | Status: DC
  Administered 2022-07-02: 1 mg via ORAL
  Filled 2022-07-02 (×2): qty 1

## 2022-07-02 MED ORDER — IOHEXOL 350 MG/ML SOLN
75.0000 mL | Freq: Once | INTRAVENOUS | Status: AC | PRN
  Administered 2022-07-02: 75 mL via INTRAVENOUS

## 2022-07-02 MED ORDER — METOPROLOL SUCCINATE ER 25 MG PO TB24
25.0000 mg | ORAL_TABLET | Freq: Every day | ORAL | Status: DC
  Administered 2022-07-03: 25 mg via ORAL
  Filled 2022-07-02: qty 1

## 2022-07-02 MED ORDER — ONDANSETRON HCL 4 MG/2ML IJ SOLN: 4.0000 mg | Freq: Four times a day (QID) | INTRAMUSCULAR | Status: DC | PRN

## 2022-07-02 MED ORDER — AMIODARONE HCL 200 MG PO TABS: 100.0000 mg | ORAL_TABLET | Freq: Every day | ORAL | Status: DC

## 2022-07-02 MED ORDER — COLESTIPOL HCL 1 G PO TABS
2.0000 g | ORAL_TABLET | Freq: Two times a day (BID) | ORAL | Status: DC
  Administered 2022-07-02: 2 g via ORAL
  Filled 2022-07-02 (×3): qty 2

## 2022-07-02 NOTE — Code Documentation (Signed)
Stroke Response Nurse Documentation Code Documentation  Stephanie [name] is a 71 y.o. female arriving to [facility] via [facility] on 07/02/2022 with past medical hx of a-fib, HTN, HLD, short term deficits, per granddaughter. On Eliquis (apixaban) daily. Code stroke was activated by ED.   Patient from doctors office where she was LKW at 07/01/2022 and now complaining of confusion. Patient drove herself to a scheduled doctors appointment this morning and was oriented during visit.  Patient went for an abdominal CT ordered by provider and became altered during that exam and was sent to ED triage. Code stroke called by ED triage PA.   Stroke team met patient in CT. Patient cleared for CT by triage PA. NIHSS 1, see documentation for details and code stroke times. Patient with disoriented on exam. The following imaging was completed:  CT Head and CTA. Patient is not a candidate for IV Thrombolytic due to recent eliquis dose taken 10/11, per MD. Patient is not a candidate for IR due to no LVO suspected, no LVO in imaging, per MD.   Care Plan: Q2 NIHSS and vital signs.   Bedside handoff with ED RN Bill.    Charise Carwin  Stroke Response RN

## 2022-07-02 NOTE — ED Triage Notes (Signed)
Pt to ER with granddaughter who states pt has been confused today.  States she talked with her yesterday and was her normal.  States when she spoke with her today she did not know where she was at and has asked the same questions repeatedly.  States she has an appointment at Dr. Linton Ham office this AM and had bloodwork and a CT all at [facility].

## 2022-07-02 NOTE — Progress Notes (Signed)
CODE STROKE- PHARMACY COMMUNICATION  Time CODE STROKE called/page received: 1341  Time response to CODE STROKE was made: immediately  Time Stroke Kit retrieved from [facility] (only if needed): N/A - no TNK per neurologist  Name of Provider/Nurse contacted: Dr. Quinn Axe  Past Medical History:  Diagnosis Date   A-fib [facility])    Arthritis    Chronic diarrhea    Diverticulosis    Dysrhythmia    ATRIAL FIB   Hyperlipidemia    Hypertension    Restless leg    Prior to Admission medications   Medication Sig Start Date End Date Taking? Authorizing Provider  amiodarone (PACERONE) 200 MG tablet Take 200 mg by mouth daily. 05/06/21   [provider]  calcium citrate-vitamin D (CITRACAL+D) 315-200 MG-UNIT per tablet Take 1 tablet by mouth daily.     [provider]  diltiazem (CARDIZEM CD) 120 MG 24 hr capsule Take 1 capsule (120 mg total) by mouth daily. Patient not taking: Reported on 06/13/2021 04/17/21   Fritzi Mandes, MD  ELIQUIS 5 MG TABS tablet Take 5 mg by mouth 2 (two) times daily. 05/03/18   [provider]  hydrochlorothiazide (HYDRODIURIL) 25 MG tablet Take 25 mg by mouth daily. 03/07/21   [provider]  metoprolol succinate (TOPROL-XL) 50 MG 24 hr tablet Take 100 mg by mouth daily. 05/03/18   [provider]  metoprolol tartrate (LOPRESSOR) 100 MG tablet Take 1 tablet (100 mg total) by mouth once for 1 dose. Please take one time dose 141m metoprolol tartrate 2 hr prior to cardiac CT for HR control IF HR >55bpm. 05/13/22 05/13/22  AKate Sable MD  Polyethyl Glycol-Propyl Glycol (SYSTANE) 0.4-0.3 % GEL ophthalmic gel Place 1 application into both eyes daily as needed (dry eyes).    [provider]  sertraline (ZOLOFT) 50 MG tablet Take 50 mg by mouth daily.    [provider]   WDara Hoyer PharmD PGY-1 Pharmacy Resident 07/02/2022 2:03 PM

## 2022-07-02 NOTE — ED Notes (Signed)
Called [facility] spoke to Tammy to activate code stroke 1339

## 2022-07-02 NOTE — ED Provider Triage Note (Signed)
Emergency Medicine Provider Triage Evaluation Note  Stephanie [name] , a 71 y.o. female  was evaluated in triage.  Pt complains of ams,  .  Review of Systems  Positive: ams Negative: cp  Physical Exam  BP (!) 140/65   Pulse 63   Temp 97.7 F (36.5 C) (Oral)   Resp 18   Ht '5\' 5"'$  (1.651 m)   Wt 86 kg   SpO2 95%   BMI 31.55 kg/m  Gen:   Awake, no distress   Resp:  Normal effort  MSK:   Moves extremities without difficulty  Other:  Patient is not oriented to day, year, time  Medical Decision Making  Medically screening exam initiated at 1:42 PM.  Appropriate orders placed.  Stephanie [name] was informed that the remainder of the evaluation will be completed by another provider, this initial triage assessment does not replace that evaluation, and the importance of remaining in the ED until their evaluation is complete.  Code stroke initiated, lkw was around 1130   Versie Starks, [place] 07/02/22 1343

## 2022-07-02 NOTE — Progress Notes (Signed)
   07/02/22 1400  Clinical Encounter Type  Visited With Family  Visit Type Initial;Code  Referral From Nurse  Consult/Referral To Chaplain   Chaplain responded to Code Stroke. Patient at CT scan. Chaplain provided support to grand daughter as she waited.

## 2022-07-02 NOTE — Assessment & Plan Note (Signed)
Blood sugar within goal at this time  -Restart home medications including hydrochlorothiazide and metoprolol

## 2022-07-02 NOTE — Assessment & Plan Note (Signed)
Euvolemic on examination today likely in the setting of persistent nausea and vomiting.  Patient is not on diuretics at home.  Will monitor volume status on IV fluids.

## 2022-07-02 NOTE — ED Provider Notes (Signed)
[facility] Provider Note    Event Date/Time   First MD Initiated Contact with Patient 07/02/22 1356     (approximate)   History   Chief Complaint: Altered Mental Status   HPI  Stephanie [name] is a 71 y.o. female with a history of hypertension, atrial fibrillation status post watchman implantation 3 weeks ago who was brought to the ED due to confusion today.  Yesterday the patient was totally normal and this morning the patient had no memory of the events today and was confused about where she was, clearly not oriented.  Went to primary care this morning and was sent to the ED for evaluation.  Patient admits that she may have missed a few doses of her Eliquis.  Denies pain or fever.     Physical Exam   Triage Vital Signs: ED Triage Vitals  Enc Vitals Group     BP 07/02/22 1327 (!) 140/65     Pulse Rate 07/02/22 1327 63     Resp 07/02/22 1327 18     Temp 07/02/22 1327 97.7 F (36.5 C)     Temp Source 07/02/22 1327 Oral     SpO2 07/02/22 1327 95 %     Weight 07/02/22 1328 189 lb 9.5 oz (86 kg)     Height 07/02/22 1328 '5\' 5"'$  (1.651 m)     Head Circumference --      Peak Flow --      Pain Score 07/02/22 1328 0     Pain Loc --      Pain Edu? --      Excl. in [place]? --     Most recent vital signs: Vitals:   07/02/22 1327  BP: (!) 140/65  Pulse: 63  Resp: 18  Temp: 97.7 F (36.5 C)  SpO2: 95%    General: Awake, no distress.  CV:  Good peripheral perfusion.  Regular rate and rhythm Resp:  Normal effort.  Abd:  No distention.  Mild epigastric tenderness to palpation. Other:  Not oriented.  Loss of memory and short-term.   ED Results / Procedures / Treatments   Labs (all labs ordered are listed, but only abnormal results are displayed) Labs Reviewed  PROTIME-INR - Abnormal; Notable for the following components:      Result Value   Prothrombin Time 16.7 (*)    INR 1.4 (*)    All other components within normal limits  COMPREHENSIVE  METABOLIC PANEL - Abnormal; Notable for the following components:   Sodium 132 (*)    Potassium 3.0 (*)    Chloride 93 (*)    Calcium 8.6 (*)    Albumin 3.3 (*)    All other components within normal limits  URINALYSIS, ROUTINE W REFLEX MICROSCOPIC - Abnormal; Notable for the following components:   Color, Urine YELLOW (*)    APPearance CLEAR (*)    Specific Gravity, Urine 1.031 (*)    All other components within normal limits  CBG MONITORING, ED - Abnormal; Notable for the following components:   Glucose-Capillary 109 (*)    All other components within normal limits  ETHANOL  APTT  CBC  DIFFERENTIAL  URINE DRUG SCREEN, QUALITATIVE ([facility] ONLY)  LIPASE, BLOOD  HEMOGLOBIN A1C  LIPID PANEL     EKG Interpreted by me Sinus rhythm rate of 58.  Normal axis, normal intervals.  Left bundle branch block.  No acute ischemic changes.   RADIOLOGY CT head interpreted by me, negative for intracranial hemorrhage or mass.  Radiology report reviewed.  CT angiogram head and neck negative for acute findings, no LVO.   PROCEDURES:  Procedures   MEDICATIONS ORDERED IN ED: Medications   stroke: early stages of recovery book (has no administration in time range)  iohexol (OMNIPAQUE) 350 MG/ML injection 75 mL (75 mLs Intravenous Contrast Given 07/02/22 1405)     IMPRESSION / MDM / ASSESSMENT AND PLAN / ED COURSE  I reviewed the triage vital signs and the nursing notes.                              Differential diagnosis includes, but is not limited to, intracranial hemorrhage, acute stroke, electrolyte abnormality, AKI, dehydration, UTI, pancreatitis  Patient's presentation is most consistent with acute presentation with potential threat to life or bodily function.  Patient presents with memory loss and confusion.  Discussed with neurology for code stroke, suspected transient global amnesia.  Lab work-up is negative, CT and CTA is negative.  MRI pending.  Case discussed with  hospitalist for further management.       FINAL CLINICAL IMPRESSION(S) / ED DIAGNOSES   Final diagnoses:  TGA (transient global amnesia)  Atrial fibrillation, unspecified type ([place])     Rx / DC Orders   ED Discharge Orders     None        Note:  This document was prepared using Dragon voice recognition software and may include unintentional dictation errors.   Carrie Mew, MD 07/02/22 [phone]

## 2022-07-02 NOTE — Assessment & Plan Note (Signed)
Patient presenting with sudden onset amnesia with inability to to recall correctly events from today nor from the past several days.  Per family, patient has had confusion in the past but not to this severity.  It appears that it came on rapidly after her appointment with her PCP.  Given history of atrial fibrillation, code stroke called with initial CT head negative.  Neurology neurology has been consulted.  Differential also includes transient global amnesia versus toxic metabolic encephalopathy in the setting of GI illness versus hyperthyroidism given low TSH.  - Neurology following; appreciate their recommendations - MRI brain pending - Echocardiogram ordered - Free T4 and total T3 pending - Holding home Eliquis until embolic CVA ruled out - PT/OT evaluation

## 2022-07-02 NOTE — Assessment & Plan Note (Signed)
Patient has a history of paroxysmal atrial fibrillation s/p recent watchman's placement.  EKG demonstrates sinus rhythm at this time.  -Restart home Eliquis given negative MRI per neurology - Continue home metoprolol.

## 2022-07-02 NOTE — Assessment & Plan Note (Addendum)
CT imaging with incidental finding of thyroid nodule measuring approximately 1.7 cm.  TSH is low.  -Free T4 elevated and total T3 still pending -Will need outpatient thyroid scintigraphy -Patient need to follow-up with an endocrinologist.

## 2022-07-02 NOTE — Assessment & Plan Note (Signed)
Patient presented to her PCP today with symptoms of nausea, vomiting, headache, dizziness, fatigue that is most consistent with gastroenteritis.  CT abdomen/pelvis was negative.  At this time, patient is asymptomatic.  -Supportive management with IV fluids

## 2022-07-02 NOTE — Assessment & Plan Note (Signed)
Likely in the setting of GI illness.  -K-Dur 40 mill equivalents x2

## 2022-07-02 NOTE — Assessment & Plan Note (Signed)
EKG with QTc prolongation with QTc elevated at 575.  No prior history of this on previous EKGs.   -Hold home amiodarone -EKG in the morning -restart amiodarone if resolution of QTc prolongation

## 2022-07-02 NOTE — Consult Note (Signed)
NEUROLOGY CONSULTATION NOTE   Date of service: July 02, 2022 Patient Name: Stephanie [name] MRN:  [id number] DOB:  03/21/1951 Reason for consult: stroke code Requesting physician: "Dr. Carrie Mew" _ _ _   _ __   _ __ _ _  __ __   _ __   __ _  History of Present Illness   71 year old woman with a past medical history significant for A-fib on Eliquis, some short-term memory deficits at baseline per granddaughter, who was brought into the emergency department after acute onset of confusion.  Per granddaughter she did take her Eliquis yesterday but pharmacist recently noted that she had been going too long between prescriptions and there was concern that she has been intermittently missing doses of her Eliquis.  Patient went to an outpatient physician's office today for abdominal pain with nausea and vomiting and per the physician's office staff she was oriented during that visit. she then went for an abdominal CT and became altered during that exam and was sent to ED triage from there.  Last known well approximately 11 AM.  She has no focal neurologic deficits although she is unable to tell me how old she is.  NIH stroke scale is 1 for questions.  She has 0 out of 3 delayed recall which is worse than usual per her granddaughter.  CT head showed no acute intracranial process.  TNK was not administered due to therapeutic anticoagulation with Eliquis.  CTA was not performed as part of the stroke code due to exam being inconsistent with LVO however CTA was performed after the stroke code which showed no hemodynamically significant stenosis.  Of note there was a 1.7 cm thyroid nodule incidentally noted and ultrasound was recommended by radiology.  CNS imaging personally reviewed.  ROS   Per HPI: all other systems reviewed and are negative  Past History   I have reviewed the following:  Past Medical History:  Diagnosis Date   A-fib ([place])    Arthritis    Chronic diarrhea    Diverticulosis     Dysrhythmia    ATRIAL FIB   Hyperlipidemia    Hypertension    Restless leg    Past Surgical History:  Procedure Laterality Date   CARDIOVERSION N/A 05/12/2018   Procedure: CARDIOVERSION;  Surgeon: Corey Skains, MD;  Location: [facility] ORS;  Service: Cardiovascular;  Laterality: N/A;   CARDIOVERSION N/A 06/19/2021   Procedure: CARDIOVERSION;  Surgeon: Yolonda Kida, MD;  Location: [facility] ORS;  Service: Cardiovascular;  Laterality: N/A;   CHOLECYSTECTOMY  2004   COLONOSCOPY WITH PROPOFOL N/A 05/24/2019   Procedure: COLONOSCOPY WITH PROPOFOL;  Surgeon: Toledo, Benay Pike, MD;  Location: [facility] ENDOSCOPY;  Service: Gastroenterology;  Laterality: N/A;   ESOPHAGOGASTRODUODENOSCOPY (EGD) WITH PROPOFOL N/A 05/24/2019   Procedure: ESOPHAGOGASTRODUODENOSCOPY (EGD) WITH PROPOFOL;  Surgeon: Toledo, Benay Pike, MD;  Location: [facility] ENDOSCOPY;  Service: Gastroenterology;  Laterality: N/A;   KNEE SURGERY     KNEE SURGERY     TEE WITHOUT CARDIOVERSION N/A 05/12/2018   Procedure: TRANSESOPHAGEAL ECHOCARDIOGRAM (TEE);  Surgeon: Corey Skains, MD;  Location: [facility] ORS;  Service: Cardiovascular;  Laterality: N/A;   TOE SURGERY     Family History  Problem Relation Age of Onset   Breast cancer Neg Hx    Social History   Socioeconomic History   Marital status: Single    Spouse name: Not on file   Number of children: Not on file   Years of education: Not on file  Highest education level: Not on file  Occupational History   Not on file  Tobacco Use   Smoking status: Former    Packs/day: 1.00    Years: 40.00    Total pack years: 40.00    Types: Cigarettes   Smokeless tobacco: Never  Vaping Use   Vaping Use: Never used  Substance and Sexual Activity   Alcohol use: Not Currently    Comment: social   Drug use: Never   Sexual activity: Never  Other Topics Concern   Not on file  Social History Narrative   Divorced   Just moved from [place].   Originally from [place].   Works as a Counsellor for [employer].   Enjoys visiting friends, walking.   Social Determinants of Health   Financial Resource Strain: Not on file  Food Insecurity: Not on file  Transportation Needs: Not on file  Physical Activity: Not on file  Stress: Not on file  Social Connections: Not on file   Allergies  Allergen Reactions   Codeine Other (See Comments)    Cannot tolerate alone, only when mixed with another medication.   Lactose Intolerance (Gi)    Phenazopyridine Hcl Hives    Medications   (Not in a hospital admission)     Current Facility-Administered Medications:    [START ON 07/03/2022]  stroke: early stages of recovery book, , Does not apply, Once, Derek Jack, MD  Current Outpatient Medications:    amiodarone (PACERONE) 100 MG tablet, Take 100 mg by mouth daily., Disp: , Rfl:    amiodarone (PACERONE) 200 MG tablet, Take 200 mg by mouth daily., Disp: , Rfl:    calcium citrate-vitamin D (CITRACAL+D) 315-200 MG-UNIT per tablet, Take 1 tablet by mouth daily. , Disp: , Rfl:    colestipol (COLESTID) 1 g tablet, Take 2 g by mouth 2 (two) times daily., Disp: , Rfl:    dicyclomine (BENTYL) 20 MG tablet, Take 20 mg by mouth 2 (two) times daily as needed., Disp: , Rfl:    diltiazem (CARDIZEM CD) 120 MG 24 hr capsule, Take 1 capsule (120 mg total) by mouth daily. (Patient not taking: Reported on 06/13/2021), Disp: 30 capsule, Rfl: 0   ELIQUIS 5 MG TABS tablet, Take 5 mg by mouth 2 (two) times daily., Disp: , Rfl: 11   hydrochlorothiazide (HYDRODIURIL) 25 MG tablet, Take 25 mg by mouth daily., Disp: , Rfl:    metoprolol succinate (TOPROL-XL) 25 MG 24 hr tablet, Take 25 mg by mouth daily., Disp: , Rfl:    metoprolol tartrate (LOPRESSOR) 100 MG tablet, Take 1 tablet (100 mg total) by mouth once for 1 dose. Please take one time dose '100mg'$  metoprolol tartrate 2 hr prior to cardiac CT for HR control IF HR >55bpm., Disp: 1 tablet, Rfl: 0   Polyethyl Glycol-Propyl Glycol (SYSTANE)  0.4-0.3 % GEL ophthalmic gel, Place 1 application into both eyes daily as needed (dry eyes)., Disp: , Rfl:    sertraline (ZOLOFT) 50 MG tablet, Take 50 mg by mouth daily., Disp: , Rfl:   Vitals   Vitals:   07/02/22 1327 07/02/22 1328  BP: (!) 140/65   Pulse: 63   Resp: 18   Temp: 97.7 F (36.5 C)   TempSrc: Oral   SpO2: 95%   Weight:  86 kg  Height:  '5\' 5"'$  (1.651 m)     Body mass index is 31.55 kg/m.  Physical Exam   Physical Exam Gen: A&O x3, NAD HEENT: Atraumatic, normocephalic;mucous membranes  moist; oropharynx clear, tongue without atrophy or fasciculations. Neck: Supple, trachea midline. Resp: CTAB, no w/r/r CV: RRR, no m/g/r; nml S1 and S2. 2+ symmetric peripheral pulses. Abd: soft/NT/ND; nabs x 4 quad Extrem: Nml bulk; no cyanosis, clubbing, or edema.  Neuro: *MS: A&O x3. Oriented to self and year but not age and does not recall going to doctor this AM *Speech: fluid, nondysarthric, able to name and repeat *CN:    I: Deferred   II,III: PERRLA, VFF by confrontation, optic discs unable to be visualized 2/2 pupillary constriction   III,IV,VI: EOMI w/o nystagmus, no ptosis   V: Sensation intact from V1 to V3 to LT   VII: Eyelid closure was full.  Smile symmetric.   VIII: Hearing intact to voice   IX,X: Voice normal, palate elevates symmetrically    XI: SCM/trap 5/5 bilat   XII: Tongue protrudes midline, no atrophy or fasciculations   *Motor:   Normal bulk.  No tremor, rigidity or bradykinesia. No pronator drift.    Strength: Dlt Bic Tri WrE WrF FgS Gr HF KnF KnE PlF DoF    Left '5 5 5 5 5 5 5 5 5 5 5 5    '$ Right '5 5 5 5 5 5 5 5 5 5 5 5    '$ *Sensory: Intact to light touch, pinprick, temperature vibration throughout. Symmetric. Propioception intact bilat.  No double-simultaneous extinction.  *Coordination:  Finger-to-nose, heel-to-shin, rapid alternating motions were intact. *Reflexes:  2+ and symmetric throughout without clonus; toes down-going bilat *Gait:  normal base, normal stride, normal turn. Negative Romberg.  NIHSS = 1 for questions   Premorbid mRS = 1   Labs   CBC:  Recent Labs  Lab 07/02/22 1426  WBC 6.4  NEUTROABS 4.7  HGB 12.7  HCT 37.9  MCV 91.3  PLT 094    Basic Metabolic Panel:  Lab Results  Component Value Date   NA 132 (L) 07/02/2022   K 3.0 (L) 07/02/2022   CO2 28 07/02/2022   GLUCOSE 94 07/02/2022   BUN 13 07/02/2022   CREATININE 0.96 07/02/2022   CALCIUM 8.6 (L) 07/02/2022   GFRNONAA >60 07/02/2022   GFRAA >60 05/11/2018   Lipid Panel:  Lab Results  Component Value Date   LDLCALC 74 09/07/2017   HgbA1c:  Lab Results  Component Value Date   HGBA1C 5.5 04/15/2021   Urine Drug Screen: No results found for: "LABOPIA", "COCAINSCRNUR", "LABBENZ", "AMPHETMU", "THCU", "LABBARB"  Alcohol Level     Component Value Date/Time   ETH <10 07/02/2022 1426     Impression   71 year old woman with a past medical history significant for A-fib on Eliquis, some short-term memory deficits at baseline per granddaughter, who was brought into the emergency department after acute onset of confusion.  Of note patient did have abdominal pain with nausea and vomiting this morning.  Differential includes metabolic derangement, infectious etiology (UTI, GI, or other) or ischemic event.  Given that she has a history of A-fib and has been intermittently missing Eliquis doses transient global amnesia or ischemic event is more likely that it would otherwise be.  Recommendations   - Admit for stroke workup - Permissive HTN x48 hrs from sx onset or until stroke ruled out by MRI goal BP <220/110. PRN labetalol or hydralazine if BP above these parameters. Avoid oral antihypertensives. MRI brain ordered STAT - if no e/o ischemia, OK to continue home eliquis. If e/o stroke on MRI, hold eliquis until discussing w/ neurology - TTE  -  Check A1c and LDL + add statin per guidelines - No indication for antiplatelets in addition to  therapeutic anticoagulation.  - rEEG - UA - Metabolic, infectious, GI workup per primary team - q4 hr neuro checks - STAT head CT for any change in neuro exam - Tele - PT/OT/SLP - Stroke education - Amb referral to neurology upon discharge  - Will continue to follow ______________________________________________________________________   Thank you for the opportunity to take part in the care of this patient. If you have any further questions, please contact the neurology consultation attending.  Signed,  Su Monks, MD [facility] [phone]  If 7pm- 7am, please page neurology on call as listed in [place].  **Any copied and pasted documentation in this note was written by me in another application not billed for and pasted by me into this document.

## 2022-07-02 NOTE — Assessment & Plan Note (Signed)
Patient initially stated that she had quit smoking, then reiterated that she actually is still smoking.  Per patient's granddaughter, PCP recently started patient on Chantix.  We will hold at this time given altered mental status.

## 2022-07-02 NOTE — H&P (Signed)
History and Physical    Patient: Stephanie [name] FTD:[id number] DOB: [birth year] DOA: 07/02/2022 DOS: the patient was seen and examined on 07/02/2022 PCP: Tracie Harrier, MD  Patient coming from: Home  Chief Complaint:  Chief Complaint  Patient presents with   Altered Mental Status   HPI: Stephanie [name] is a 71 y.o. female with medical history significant of atrial fibrillation on Eliquis s/p Watchman, hypertension, hyperlipidemia, who presented to the ED due to altered mental status.  Stephanie [name] states that she cannot recall what she has done for the last several days.  She cannot recall going to her PCPs office this morning nor having a CT the scan.  Collateral history obtained from patient's granddaughter, Stephanie [name].  Stephanie [name] states that Stephanie [name] had been experiencing nausea and vomiting over the weekend (10/7-10/8) but began feeling better by 10/9.  However over the last 1 to 2 days, she began to experience nausea with vomiting again in addition to headache.  This is consistent with what Stephanie [name] told her PCP this morning.  At her PCP visit, Stephanie [name] also endorsed fatigue, dizziness, blurred vision, abdominal cramping and loss of taste.  Her PCP ordered a CT abdomen/pelvis but did not show any acute findings.  Stephanie [name] states she called Stephanie [name] when Stephanie [name] went back to her car and at that time, patient did not know where she was or what was happening.  She was completely disoriented.  Due to this, patient was brought to the ED.  ED course: On arrival to the ED, patient was afebrile at 97.7 with blood pressure 140/65 with heart rate of 63.  She was saturating at 95% on room air.  Show work-up remarkable for sodium of 132, potassium of 3.0.  Urinalysis with no evidence of potential UTI.  CT head with no acute intracranial abnormality.  CTA head/neck with no LVO.  Neurology consulted by ED provider.  TRH consulted for admission.  Review of Systems: As mentioned in the  history of present illness. All other systems reviewed and are negative.  Past Medical History:  Diagnosis Date   A-fib [facility])    Arthritis    Chronic diarrhea    Diverticulosis    Dysrhythmia    ATRIAL FIB   Hyperlipidemia    Hypertension    Restless leg    Past Surgical History:  Procedure Laterality Date   CARDIOVERSION N/A 05/12/2018   Procedure: CARDIOVERSION;  Surgeon: Corey Skains, MD;  Location: [facility] ORS;  Service: Cardiovascular;  Laterality: N/A;   CARDIOVERSION N/A 06/19/2021   Procedure: CARDIOVERSION;  Surgeon: Yolonda Kida, MD;  Location: [facility] ORS;  Service: Cardiovascular;  Laterality: N/A;   CHOLECYSTECTOMY  2004   COLONOSCOPY WITH PROPOFOL N/A 05/24/2019   Procedure: COLONOSCOPY WITH PROPOFOL;  Surgeon: Toledo, Benay Pike, MD;  Location: [facility] ENDOSCOPY;  Service: Gastroenterology;  Laterality: N/A;   ESOPHAGOGASTRODUODENOSCOPY (EGD) WITH PROPOFOL N/A 05/24/2019   Procedure: ESOPHAGOGASTRODUODENOSCOPY (EGD) WITH PROPOFOL;  Surgeon: Toledo, Benay Pike, MD;  Location: [facility] ENDOSCOPY;  Service: Gastroenterology;  Laterality: N/A;   KNEE SURGERY     KNEE SURGERY     TEE WITHOUT CARDIOVERSION N/A 05/12/2018   Procedure: TRANSESOPHAGEAL ECHOCARDIOGRAM (TEE);  Surgeon: Corey Skains, MD;  Location: [facility] ORS;  Service: Cardiovascular;  Laterality: N/A;   TOE SURGERY     Social History:  reports that she has quit smoking. Her smoking use included cigarettes. She has a 40.00 pack-year smoking history. She has never used smokeless tobacco. She reports that  she does not currently use alcohol. She reports that she does not use drugs.  Allergies  Allergen Reactions   Codeine Other (See Comments)    Cannot tolerate alone, only when mixed with another medication. Other reaction(s): Other (See Comments) Makes patient feel spaced out. Patient has use codeine combined with other medications.   Lactulose     Other reaction(s): Other (See Comments) diarrhea   Lactose  Intolerance (Gi)    Phenazopyridine Hcl Hives    Family History  Problem Relation Age of Onset   Breast cancer Neg Hx     Prior to Admission medications   Medication Sig Start Date End Date Taking? Authorizing Provider  amiodarone (PACERONE) 100 MG tablet Take 100 mg by mouth daily. 05/27/22  Yes [provider]  calcium citrate-vitamin D (CITRACAL+D) 315-200 MG-UNIT per tablet Take 1 tablet by mouth daily.    Yes [provider]  colestipol (COLESTID) 1 g tablet Take 2 g by mouth 2 (two) times daily. 06/15/22  Yes [provider]  dicyclomine (BENTYL) 20 MG tablet Take 20 mg by mouth 2 (two) times daily as needed. 06/12/22  Yes [provider]  ELIQUIS 5 MG TABS tablet Take 5 mg by mouth 2 (two) times daily. 05/03/18  Yes [provider]  hydrochlorothiazide (HYDRODIURIL) 25 MG tablet Take 25 mg by mouth daily. 03/07/21  Yes [provider]  metoprolol succinate (TOPROL-XL) 25 MG 24 hr tablet Take 25 mg by mouth daily. 06/26/22  Yes [provider]  sertraline (ZOLOFT) 50 MG tablet Take 50 mg by mouth daily.   Yes [provider]  amiodarone (PACERONE) 200 MG tablet Take 200 mg by mouth daily. Patient not taking: Reported on 07/02/2022 05/06/21   [provider]  diltiazem (CARDIZEM CD) 120 MG 24 hr capsule Take 1 capsule (120 mg total) by mouth daily. Patient not taking: Reported on 06/13/2021 04/17/21   Fritzi Mandes, MD  metoprolol tartrate (LOPRESSOR) 100 MG tablet Take 1 tablet (100 mg total) by mouth once for 1 dose. Please take one time dose '100mg'$  metoprolol tartrate 2 hr prior to cardiac CT for HR control IF HR >55bpm. 05/13/22 05/13/22  Kate Sable, MD  Polyethyl Glycol-Propyl Glycol (SYSTANE) 0.4-0.3 % GEL ophthalmic gel Place 1 application into both eyes daily as needed (dry eyes).    [provider]    Physical Exam: Vitals:   07/02/22 1600 07/02/22 1630 07/02/22 1700 07/02/22 1840  BP: (!)  141/66 (!) 148/74 127/70   Pulse: (!) 58 (!) 59 60   Resp: (!) 23 16 (!) 21   Temp:    97.6 F (36.4 C)  TempSrc:    Oral  SpO2: 100% 99% 100%   Weight:      Height:       Physical Exam Vitals and nursing note reviewed.  Constitutional:      General: She is not in acute distress.    Appearance: She is normal weight. She is not toxic-appearing.  HENT:     Head: Normocephalic and atraumatic.     Nose: Nose normal.     Mouth/Throat:     Mouth: Mucous membranes are moist.     Pharynx: Oropharynx is clear.  Eyes:     General: No scleral icterus.    Conjunctiva/sclera: Conjunctivae normal.     Pupils: Pupils are equal, round, and reactive to light.  Cardiovascular:     Rate and Rhythm: Normal rate. Rhythm irregular.  Pulmonary:  Effort: Pulmonary effort is normal. No respiratory distress.     Breath sounds: No wheezing, rhonchi or rales.  Abdominal:     General: Bowel sounds are normal. There is no distension.     Palpations: Abdomen is soft.     Tenderness: There is no abdominal tenderness. There is no guarding.  Musculoskeletal:     Right lower leg: No edema.     Left lower leg: No edema.  Skin:    General: Skin is warm and dry.  Neurological:     Mental Status: She is alert. She is disoriented and confused.     Cranial Nerves: Cranial nerves 2-12 are intact. No cranial nerve deficit, dysarthria or facial asymmetry.     Sensory: Sensation is intact.     Motor: Motor function is intact. No weakness, tremor, atrophy or abnormal muscle tone.     Comments: Patient oriented only to self.  She believes the year is 2001 and is unable to recall what city she is in.  She is uncertain why she is in the hospital.  Unable to recall 3 words after 5 minutes.  Psychiatric:        Mood and Affect: Mood normal.        Behavior: Behavior normal. Behavior is cooperative.        Cognition and Memory: Cognition is impaired. Memory is impaired.        Judgment: Judgment normal.    Data  Reviewed: CBC remarkable for lack of leukocytosis, anemia or thrombocytopenia.  CMP remarkable for sodium of 132, potassium of 3.0, calcium of 8.6, creatinine of 0.96.  Lipase within normal limits.  Ethanol level negative.  INR slightly elevated at 1.4.  Urine drug screen negative.  Urinalysis without abnormalities.  TSH low at 0.017.  EKG personally reviewed.  Sinus rhythm with a rate of 58.  No PR abnormalities.  QT prolongation noted with QTc of 575.  CT ANGIO HEAD NECK W WO CM  Result Date: 07/02/2022 CLINICAL DATA:  Neuro deficit, acute, stroke suspected confusion EXAM: CT ANGIOGRAPHY HEAD AND NECK TECHNIQUE: Multidetector CT imaging of the head and neck was performed using the standard protocol during bolus administration of intravenous contrast. Multiplanar CT image reconstructions and MIPs were obtained to evaluate the vascular anatomy. Carotid stenosis measurements (when applicable) are obtained utilizing NASCET criteria, using the distal internal carotid diameter as the denominator. RADIATION DOSE REDUCTION: This exam was performed according to the departmental dose-optimization program which includes automated exposure control, adjustment of the mA and/or kV according to patient size and/or use of iterative reconstruction technique. CONTRAST:  46m OMNIPAQUE IOHEXOL 350 MG/ML SOLN COMPARISON:  Same day CT head. FINDINGS: CTA NECK FINDINGS Aortic arch: Great vessel origins are patent without significant stenosis. Right carotid system: No evidence of dissection, stenosis (50% or greater), or occlusion. Left carotid system: No evidence of dissection, stenosis (50% or greater), or occlusion. Vertebral arteries: Codominant. No evidence of dissection, stenosis (50% or greater), or occlusion. Skeleton: No acute findings. Other neck: Approximately 1.7 cm left thyroid nodule. Upper chest: Visualized lung apices are clear. Review of the MIP images confirms the above findings CTA HEAD FINDINGS Anterior  circulation: Bilateral intracranial ICAs, MCAs, and ACAs are patent without proximal hemodynamically significant stenosis. Partially azygos ACA, anatomic variant. Posterior circulation: Bilateral intradural vertebral arteries, basilar artery, and bilateral posterior cerebral arteries are patent without proximal hemodynamically significant stenosis. Venous sinuses: As permitted by contrast timing, patent. Anatomic variants: Detailed above. Review of the MIP images  confirms the above findings IMPRESSION: No emergent large vessel occlusion or proximal hemodynamically significant stenosis. Approximately 1.7 cm left thyroid nodule. Recommend thyroid US (ref: J Am Coll Radiol. 2015 Feb;12(2): 143-50). Electronically Signed   By: Margaretha Sheffield M.D.   On: 07/02/2022 14:22   CT HEAD CODE STROKE WO CONTRAST  Result Date: 07/02/2022 CLINICAL DATA:  Code stroke.  Confusion EXAM: CT HEAD WITHOUT CONTRAST TECHNIQUE: Contiguous axial images were obtained from the base of the skull through the vertex without intravenous contrast. RADIATION DOSE REDUCTION: This exam was performed according to the departmental dose-optimization program which includes automated exposure control, adjustment of the mA and/or kV according to patient size and/or use of iterative reconstruction technique. COMPARISON:  None Available. FINDINGS: Brain: No evidence of acute infarction, hemorrhage, cerebral edema, mass, mass effect, or midline shift. No hydrocephalus or extra-axial collection. Vascular: No hyperdense vessel. Skull: Negative for fracture or focal lesion. Sinuses/Orbits: No acute finding. Status post bilateral lens replacements. Remote right lamina papyracea defect. Other: The mastoid air cells are well aerated. ASPECTS [facility] Stroke Program Early CT Score) - Ganglionic level infarction (caudate, lentiform nuclei, internal capsule, insula, M1-M3 cortex): 7 - Supraganglionic infarction (M4-M6 cortex): 3 Total score (0-10 with 10 being  normal): 10 IMPRESSION: No acute intracranial process. ASPECTS is 10 Code stroke imaging results were communicated on 07/02/2022 at 1:49 pm to provider STAFFORD via telephone, who verbally acknowledged these results. Electronically Signed   By: Merilyn Baba M.D.   On: 07/02/2022 13:49   CT ABDOMEN PELVIS WO CONTRAST  Result Date: 07/02/2022 CLINICAL DATA:  Fatigue, headaches, dizziness, nausea and vomiting over the last week. EXAM: CT ABDOMEN AND PELVIS WITHOUT CONTRAST TECHNIQUE: Multidetector CT imaging of the abdomen and pelvis was performed following the standard protocol without IV contrast. RADIATION DOSE REDUCTION: This exam was performed according to the departmental dose-optimization program which includes automated exposure control, adjustment of the mA and/or kV according to patient size and/or use of iterative reconstruction technique. COMPARISON:  Chest CTA 04/15/2021. FINDINGS: Lower chest: Mild atelectasis or scarring at both lung bases. Mild atherosclerosis of the aorta and coronary arteries. Apparent new occlusive device in the left atrial appendage. Hepatobiliary: The liver appears unremarkable as imaged in the noncontrast state. No evidence of significant biliary dilatation status post cholecystectomy. Pancreas: Unremarkable. No pancreatic ductal dilatation or surrounding inflammatory changes. Spleen: Normal in size without focal abnormality. Adrenals/Urinary Tract: Both adrenal glands appear normal. No evidence of urinary tract calculus, suspicious renal lesion or hydronephrosis. The bladder appears unremarkable for its degree of distention. Stomach/Bowel: No enteric contrast administered. The stomach appears unremarkable for its degree of distension. No evidence of bowel wall thickening, distention or surrounding inflammatory change. The appendix appears normal. Minimal distal colonic diverticulosis. Vascular/Lymphatic: There are no enlarged abdominal or pelvic lymph nodes. Mild aortic and  branch vessel atherosclerosis. Reproductive: Status post hysterectomy. No evidence of adnexal mass. Probable pelvic floor laxity. Other: No evidence of abdominal wall mass or hernia. No ascites. Musculoskeletal: No acute or significant osseous findings. There is a nonacute appearing mild superior endplate compression deformity at L3-4, a grade 1 degenerative anterolisthesis at L4-5 and additional mild multilevel spondylosis. IMPRESSION: 1. No acute findings or explanation for the patient's symptoms. 2. Minimal distal colonic diverticulosis. 3. Probable pelvic floor laxity. 4.  Aortic Atherosclerosis (ICD10-I70.0). Electronically Signed   By: Richardean Sale M.D.   On: 07/02/2022 11:33    Results are pending, will review when available.  Assessment and Plan: * Acute encephalopathy  Patient presenting with sudden onset amnesia with inability to to recall correctly events from today nor from the past several days.  Per family, patient has had confusion in the past but not to this severity.  It appears that it came on rapidly after her appointment with her PCP.  Given history of atrial fibrillation, code stroke called with initial CT head negative.  Neurology neurology has been consulted.  Differential also includes transient global amnesia versus toxic metabolic encephalopathy in the setting of GI illness versus hyperthyroidism given low TSH.  - Neurology following; appreciate their recommendations - MRI brain pending - Echocardiogram ordered - Free T4 and total T3 pending - Holding home Eliquis until embolic CVA ruled out - PT/OT evaluation  Gastroenteritis Patient presented to her PCP today with symptoms of nausea, vomiting, headache, dizziness, fatigue that is most consistent with gastroenteritis.  CT abdomen/pelvis was negative.  At this time, patient is asymptomatic.  -Supportive management with IV fluids  Atrial fibrillation [facility]) Patient has a history of paroxysmal atrial fibrillation s/p  recent watchman's placement.  EKG demonstrates sinus rhythm at this time.  -Restart home Eliquis given negative MRI per neurology - Continue home metoprolol.  Acute systolic congestive heart failure ([place]) Euvolemic on examination today likely in the setting of persistent nausea and vomiting.  Patient is not on diuretics at home.  Will monitor volume status on IV fluids.  Essential hypertension Blood sugar within goal at this time  -Restart home medications including hydrochlorothiazide and metoprolol  Tobacco abuse Patient initially stated that she had quit smoking, then reiterated that she actually is still smoking.  Per patient's granddaughter, PCP recently started patient on Chantix.  We will hold at this time given altered mental status.  QT prolongation EKG with QTc prolongation with QTc elevated at 575.  No prior history of this on previous EKGs.   -Hold home amiodarone -EKG in the morning -restart amiodarone if resolution of QTc prolongation  Hypokalemia Likely in the setting of GI illness.  -K-Dur 40 mill equivalents x2  Thyroid nodule CT imaging with incidental finding of thyroid nodule measuring approximately 1.7 cm.  TSH is low.  -Free T4 and total T3 still pending -Will need outpatient thyroid scintigraphy   Advance Care Planning:   Code Status: Full Code given AMS.  Consults: Neurology  Family Communication: Patient's grand daughter updated via telephone  Severity of Illness: The appropriate patient status for this patient is OBSERVATION. Observation status is judged to be reasonable and necessary in order to provide the required intensity of service to ensure the patient's safety. The patient's presenting symptoms, physical exam findings, and initial radiographic and laboratory data in the context of their medical condition is felt to place them at decreased risk for further clinical deterioration. Furthermore, it is anticipated that the patient will be  medically stable for discharge from the hospital within 2 midnights of admission.   Author: Jose Persia, MD 07/02/2022 7:55 PM  For on call review [email or web address].

## 2022-07-02 NOTE — Plan of Care (Signed)
DWI and ADC MRI images reviewed - no e/o stroke. Home eliquis '5mg'$  bid restarted. Patient is currently in MRI, remainder of images are pending. Granddaughter Benjamine Mola updated by phone.  Su Monks, MD [facility] [phone]  If 7pm- 7am, please page neurology on call as listed in [place].

## 2022-07-03 ENCOUNTER — Observation Stay (HOSPITAL_BASED_OUTPATIENT_CLINIC_OR_DEPARTMENT_OTHER)
Admit: 2022-07-03 | Discharge: 2022-07-03 | Disposition: A | Discharge status: Home / Self Care | Payer: Medicare HMO | Attending: Neurology | Admitting: Neurology

## 2022-07-03 DIAGNOSIS — G459 Transient cerebral ischemic attack, unspecified: Secondary | ICD-10-CM | POA: Diagnosis not present

## 2022-07-03 DIAGNOSIS — G9340 Encephalopathy, unspecified: Secondary | ICD-10-CM | POA: Diagnosis not present

## 2022-07-03 LAB — CBC
HCT: 34.7 % — ABNORMAL LOW (ref 36.0–46.0)
Hemoglobin: 11.9 g/dL — ABNORMAL LOW (ref 12.0–15.0)
MCH: 31.4 pg (ref 26.0–34.0)
MCHC: 34.3 g/dL (ref 30.0–36.0)
MCV: 91.6 fL (ref 80.0–100.0)
Platelets: 164 10*3/uL (ref 150–400)
RBC: 3.79 MIL/uL — ABNORMAL LOW (ref 3.87–5.11)
RDW: 12.8 % (ref 11.5–15.5)
WBC: 4.7 10*3/uL (ref 4.0–10.5)
nRBC: 0 % (ref 0.0–0.2)

## 2022-07-03 LAB — ECHOCARDIOGRAM COMPLETE
Area-P 1/2: 3.42 cm2
Calc EF: 51.6 %
Height: 65 in
MV M vel: 3.07 m/s
MV Peak grad: 37.8 mmHg
S' Lateral: 3.5 cm
Single Plane A2C EF: 56.1 %
Single Plane A4C EF: 47.6 %
Weight: 3033.53 oz

## 2022-07-03 LAB — BASIC METABOLIC PANEL
Anion gap: 8 (ref 5–15)
BUN: 11 mg/dL (ref 8–23)
CO2: 28 mmol/L (ref 22–32)
Calcium: 8.6 mg/dL — ABNORMAL LOW (ref 8.9–10.3)
Chloride: 100 mmol/L (ref 98–111)
Creatinine, Ser: 0.85 mg/dL (ref 0.44–1.00)
GFR, Estimated: >60 mL/min (ref >60)
Glucose, Bld: 96 mg/dL (ref 70–99)
Potassium: 3.3 mmol/L — ABNORMAL LOW (ref 3.5–5.1)
Sodium: 136 mmol/L (ref 135–145)

## 2022-07-03 LAB — RESP PANEL BY RT-PCR (FLU A&B, COVID) ARPGX2
Influenza A by PCR: NEGATIVE
Influenza B by PCR: NEGATIVE
SARS Coronavirus 2 by RT PCR: NEGATIVE

## 2022-07-03 LAB — LIPID PANEL
Cholesterol: 187 mg/dL (ref 0–200)
HDL: 45 mg/dL (ref >40)
LDL Cholesterol: 127 mg/dL — ABNORMAL HIGH (ref 0–99)
Total CHOL/HDL Ratio: 4.2 RATIO
Triglycerides: 77 mg/dL (ref <150)
VLDL: 15 mg/dL (ref 0–40)

## 2022-07-03 LAB — MAGNESIUM: Magnesium: 1.7 mg/dL (ref 1.7–2.4)

## 2022-07-03 MED ORDER — AMIODARONE HCL 100 MG PO TABS: 100.0000 mg | ORAL_TABLET | Freq: Every day | ORAL | Status: DC

## 2022-07-03 NOTE — ED Notes (Signed)
Informed RN bed assigned 

## 2022-07-03 NOTE — Evaluation (Signed)
Physical Therapy Evaluation Patient Details Name: Stephanie [name] MRN: [id number] DOB: 03/16/1951 Today's Date: 07/03/2022  History of Present Illness  Pt admitted to [facility] on 10/12 under observation for concern of AMS. Per granddaughter, pt has had been going too long between prescriptions and there was concern that she has been intermittently missing doses of her Eliquis. 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 Code stroke called; Imaging negative for acute infarct.    Clinical Impression  Pt is a 71 year old F admitted to hospital on 07/02/22 for acute encephalopathy. At baseline, pt is Ind with ADL's, IADL's, limited community ambulation without AD, medication management, and driving. Pt presents with mild BUE and hip weakness, mild dynamic balance deficits that improved with time, mild confusion, and decreased activity tolerance. Despite deficits, pt was grossly Ind with bed mobility, transfers, and toileting/hygiene. She was provided supervision with ambulation, for safety and vital monitoring. Pt notes that her deficits are at baseline, with biggest complaint of deconditioning as demonstrated by RR reaching 30's during minimal gait. Due to pt being at baseline she is not an appropriate candidate for skilled acute PT services at this time, therefore, PT to sign off. Will recommend DC home with referral to OPPT for deconditioning. Pt agreeable. No DME needed at this time.  Recommending ambulation with nursing staff and mobility specialists while hospitalized.        Recommendations for follow up therapy are one component of a multi-disciplinary discharge planning process, led by the attending physician.  Recommendations may be updated based on patient status, additional functional criteria and insurance authorization.  Follow Up Recommendations Outpatient PT      Assistance Recommended at Discharge PRN      Equipment Recommendations None recommended by PT     Functional Status Assessment Patient has had a recent decline in their functional status and demonstrates the ability to make significant improvements in function in a reasonable and predictable amount of time.     Precautions / Restrictions Precautions Precautions: Fall Precaution Comments: permissive HTN (first 48hrs) </= 220/110 mmHg Restrictions Weight Bearing Restrictions: No      Mobility  Bed Mobility Overal bed mobility: Independent             General bed mobility comments: HOB elevated    Transfers Overall transfer level: Independent Equipment used: None               General transfer comment: STS from EOB and low height commode, UE for support    Ambulation/Gait Ambulation/Gait assistance: Supervision Gait Distance (Feet): 100 Feet (10ft x2) Assistive device: None         General Gait Details: Ambulates to bathroom with supervision for safety and monitoring of vitals, no AD. Demonstrates early reciprocal gait with adequate foot clearance bil. States that gait is normally a little unsteady when she first gets up but improves overtime. walks limited distances at baseline. Otherwise, notes gait is at baseline.     Balance Overall balance assessment: No apparent balance deficits (not formally assessed) (mild unsteadiness with initial gait that improved with time/repetition)                                           Pertinent Vitals/Pain Pain Assessment Pain Assessment: No/denies pain    Home Living Family/patient expects to be  discharged to:: Private residence Living Arrangements: Alone Available Help at Discharge: Family;Available PRN/intermittently (granddaughter) Type of Home: Apartment Probation officer) Home Access: Level entry (curb step from parking lot)       Home Layout: One level Home Equipment: [place] - quad;Shower seat;Grab bars - tub/shower;Hand held shower  head Additional Comments: reports having cane but unable to state what kind    Prior Function Prior Level of Function : Independent/Modified Independent;History of Falls (last six months);Driving             Mobility Comments: Ind with household/limited community ambulation without AD. Limited ambulation distance secondary to deconditioning. Hx of 1 fall in the past 37mo reports feeling unsteady on her feet sometimes. ADLs Comments: Ind ADL's, medication management, and IADL's. Reports she eats out and can make food at her apt. Later states that "facility" will provide meals PRN. 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     Hand Dominance   Dominant Hand: Right    Extremity/Trunk Assessment   Upper Extremity Assessment Upper Extremity Assessment: Overall WFL for tasks assessed (Grossly 4/5; sensation (superificial/deep/combined cortical) and coordination intact)    Lower Extremity Assessment Lower Extremity Assessment: Overall WFL for tasks assessed (Hip flexion 4/5; otherwise grossly 5/5. 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 A&O x3; required cues for time and states situation as "a-fib". Once oriented, pt agreeable for confusion as reason for admission.        General Comments General comments (skin integrity, edema, etc.): Afib during session at 114bpm; elevated RR in 30's during gait. She notes that this is normal as she does not walk far at baseline. No c/o dizziness or lightheadedess during session. BP at 120/72 mmHg    Exercises Other Exercises Other Exercises: Participates in bed  mobility, transfers (EOB/commode), and ambulation without AD to bathroom. Ind with toileting and hygiene. Notes gait and endurance are at baseline. Other Exercises: Pt educated re: PT role/POC, DC recommendations, deconditioning, Afib, medication compliance (timer pill boxes vs. setting timer on phone), ambulating with hospital staff while hositalized. Pt verbalized understanding.   Assessment/Plan    PT Assessment All further PT needs can be met in the next venue of care  PT Problem List Decreased activity tolerance;Decreased balance           PT Goals (Current goals can be found in the Care Plan section)  Acute Rehab PT Goals Patient Stated Goal: "get balance back" PT Goal Formulation: With patient Time For Goal Achievement: 07/17/22 Potential to Achieve Goals: Good     AM-PAC PT "6 Clicks" Mobility  Outcome Measure Help needed turning from your back to your side while in a flat bed without using bedrails?: None Help needed moving from lying on your back to sitting on the side of a flat bed without using bedrails?: None Help needed moving to and from a bed to a chair (including a wheelchair)?: None Help needed standing up from a chair using your arms (e.g., wheelchair or bedside chair)?: None Help needed to walk  in hospital room?: A Little Help needed climbing 3-5 steps with a railing? : A Little 6 Click Score: 22    End of Session Equipment Utilized During Treatment: Gait belt Activity Tolerance: Patient tolerated treatment well Patient left: in bed Nurse Communication: Mobility status PT Visit Diagnosis: Unsteadiness on feet (R26.81);History of falling (Z91.81)    Time: 6063-0160 PT Time Calculation (min) (ACUTE ONLY): 24 min   Charges:   PT Evaluation $PT Eval Low Complexity: 1 Low         Herminio Commons, PT, DPT 9:51 AM,07/03/22 Physical Therapist - [facility]

## 2022-07-03 NOTE — Progress Notes (Signed)
OT Cancellation Note  Patient Details Name: Kelly Ranieri MRN: [id number] DOB: 1951-05-08   Cancelled Treatment:    Reason Eval/Treat Not Completed: OT screened, no needs identified, will sign off. Orders received and chart reviewed. Per conversation with PT, pt near baseline functional independence. Will sign off. Please re-consult if new needs arise.   Dessie Coma, M.S. OTR/L  07/03/22, 9:48 AM  ascom [phone]

## 2022-07-03 NOTE — Progress Notes (Signed)
  Echocardiogram 2D Echocardiogram has been performed.  Lana Fish 07/03/2022, 10:17 AM

## 2022-07-03 NOTE — Hospital Course (Addendum)
Taken from H&P.  Stephanie [name] is a 71 y.o. female with medical history significant of atrial fibrillation on Eliquis s/p Watchman, hypertension, hyperlipidemia, who presented to the ED due to altered mental status.  Per granddaughter patient had recent nausea, vomiting and abdominal pain which has been resolved.  She went to see her PCP with complaint of fatigue, dizziness, blurry vision and abdominal cramps.  CT abdomen and pelvis was done by PCP and it was negative for any acute abnormalities. Granddaughter felt that she is very disoriented so she brought her to ED.  ED course.  Hemodynamically stable.  Saturating well on room air. Labs pertinent for sodium of 132, potassium 3, UA with no evidence of UTI. CT head with no acute intracranial abnormality.   CTA head/neck with no LVO.  Did show a thyroid nodule which need further work-up. MRI brain was normal. Neurology was also consulted from ED and he does advise completion of TIA/stroke work-up .  No acute stroke at this time. EKG with prolonged QTc at 575-amiodarone was held.  10/13: Patient remained hemodynamically stable.  Labs with TSH of 0.017 and free T4 of 1.85, T3 pending.  CTA head and neck shows a thyroid nodule measuring 1.7 cm.  Patient need to follow-up with an outpatient endocrinologist for further management. Her COVID and flu PCR came back negative. Patient appears to be at her baseline now.  Granddaughter was very concerned about this transient loss of memory and asking about the possibilities that it will not happen.  No definitive answers or diagnosis.  TIA cannot be ruled out.  Patient is on Eliquis and will resume her home aspirin after finishing the course of Eliquis.  She should discuss taking statin with her PCP as her triglycerides were mildly elevated.  Patient was advised to keep herself well-hydrated and eat healthy.  She will hold her home amiodarone until she sees her cardiologist, as QTc was prolonged and this  new concern of hyperthyroidism.  She will continue on current medications and need to have a close follow-up with her providers for further recommendations.

## 2022-07-03 NOTE — Discharge Summary (Signed)
Physician Discharge Summary   Patient: Stephanie [name] MRN: [id number] DOB: [birth year]  Admit date:     07/02/2022  Discharge date: 07/03/22  Discharge Physician: Lorella Nimrod   PCP: Tracie Harrier, MD   Recommendations at discharge:  Please obtain CBC and BMP in 1 week Please obtain EKG during follow-up appointment to check on QTc. Amiodarone was placed on hold until she sees her physicians. Patient need to have a follow-up with endocrinologist for concern of hyperthyroidism and a thyroid nodule. Follow-up with primary care provider within a week. Follow-up with cardiology to discuss about amiodarone.  Discharge Diagnoses: Principal Problem:   Acute encephalopathy Active Problems:   Gastroenteritis   Atrial fibrillation (HCC)   Acute systolic congestive heart failure (HCC)   Essential hypertension   Tobacco abuse   QT prolongation   Hypokalemia   Thyroid nodule   Hospital Course: Taken from H&P.  Stephanie [name] is a 71 y.o. female with medical history significant of atrial fibrillation on Eliquis s/p Watchman, hypertension, hyperlipidemia, who presented to the ED due to altered mental status.  Per granddaughter patient had recent nausea, vomiting and abdominal pain which has been resolved.  She went to see her PCP with complaint of fatigue, dizziness, blurry vision and abdominal cramps.  CT abdomen and pelvis was done by PCP and it was negative for any acute abnormalities. Granddaughter felt that she is very disoriented so she brought her to ED.  ED course.  Hemodynamically stable.  Saturating well on room air. Labs pertinent for sodium of 132, potassium 3, UA with no evidence of UTI. CT head with no acute intracranial abnormality.   CTA head/neck with no LVO.  Did show a thyroid nodule which need further work-up. MRI brain was normal. Neurology was also consulted from ED and he does advise completion of TIA/stroke work-up .  No acute stroke at this time. EKG with  prolonged QTc at 575-amiodarone was held.  10/13: Patient remained hemodynamically stable.  Labs with TSH of 0.017 and free T4 of 1.85, T3 pending.  CTA head and neck shows a thyroid nodule measuring 1.7 cm.  Patient need to follow-up with an outpatient endocrinologist for further management. Her COVID and flu PCR came back negative. Patient appears to be at her baseline now.  Granddaughter was very concerned about this transient loss of memory and asking about the possibilities that it will not happen.  No definitive answers or diagnosis.  TIA cannot be ruled out.  Patient is on Eliquis and will resume her home aspirin after finishing the course of Eliquis.  She should discuss taking statin with her PCP as her triglycerides were mildly elevated.  Patient was advised to keep herself well-hydrated and eat healthy.  She will hold her home amiodarone until she sees her cardiologist, as QTc was prolonged and this new concern of hyperthyroidism.  She will continue on current medications and need to have a close follow-up with her providers for further recommendations.    Assessment and Plan: * Acute encephalopathy Patient presenting with sudden onset amnesia with inability to to recall correctly events from today nor from the past several days.  Per family, patient has had confusion in the past but not to this severity.  It appears that it came on rapidly after her appointment with her PCP.  Given history of atrial fibrillation, code stroke called with initial CT head negative.  Neurology neurology has been consulted.  Differential also includes transient global amnesia versus toxic metabolic encephalopathy in the  setting of GI illness versus hyperthyroidism given low TSH.  - Neurology following; appreciate their recommendations - MRI brain pending - Echocardiogram ordered - Free T4 and total T3 pending - Holding home Eliquis until embolic CVA ruled out - PT/OT  evaluation  Gastroenteritis Patient presented to her PCP today with symptoms of nausea, vomiting, headache, dizziness, fatigue that is most consistent with gastroenteritis.  CT abdomen/pelvis was negative.  At this time, patient is asymptomatic.  -Supportive management with IV fluids  Atrial fibrillation [facility]) Patient has a history of paroxysmal atrial fibrillation s/p recent watchman's placement.  EKG demonstrates sinus rhythm at this time.  -Restart home Eliquis given negative MRI per neurology - Continue home metoprolol.  Acute systolic congestive heart failure ([place]) Euvolemic on examination today likely in the setting of persistent nausea and vomiting.  Patient is not on diuretics at home.  Will monitor volume status on IV fluids.  Essential hypertension Blood sugar within goal at this time  -Restart home medications including hydrochlorothiazide and metoprolol  Tobacco abuse Patient initially stated that she had quit smoking, then reiterated that she actually is still smoking.  Per patient's granddaughter, PCP recently started patient on Chantix.  We will hold at this time given altered mental status.  QT prolongation EKG with QTc prolongation with QTc elevated at 575.  No prior history of this on previous EKGs.   -Hold home amiodarone -EKG in the morning -restart amiodarone if resolution of QTc prolongation  Hypokalemia Likely in the setting of GI illness.  -K-Dur 40 mill equivalents x2  Thyroid nodule CT imaging with incidental finding of thyroid nodule measuring approximately 1.7 cm.  TSH is low.  -Free T4 elevated and total T3 still pending -Will need outpatient thyroid scintigraphy -Patient need to follow-up with an endocrinologist.   Consultants: Neurology Procedures performed: None Disposition: Home Diet recommendation:  Discharge Diet Orders (From admission, onward)     Start     Ordered   07/03/22 0000  Diet - low sodium heart healthy        07/03/22  1322           Cardiac diet DISCHARGE MEDICATION: Allergies as of 07/03/2022       Reactions   Codeine Other (See Comments)   Cannot tolerate alone, only when mixed with another medication. Other reaction(s): Other (See Comments) Makes patient feel spaced out. Patient has use codeine combined with other medications.   Lactulose    Other reaction(s): Other (See Comments) diarrhea   Lactose Intolerance (gi)    Phenazopyridine Hcl Hives        Medication List     STOP taking these medications    diltiazem 120 MG 24 hr capsule Commonly known as: CARDIZEM CD   metoprolol tartrate 100 MG tablet Commonly known as: LOPRESSOR       TAKE these medications    amiodarone 100 MG tablet Commonly known as: PACERONE Take 1 tablet (100 mg total) by mouth daily. Please hold until see your cardiologist What changed:  additional instructions Another medication with the same name was removed. Continue taking this medication, and follow the directions you see here.   calcium citrate-vitamin D 315-200 MG-UNIT tablet Commonly known as: CITRACAL+D Take 1 tablet by mouth daily.   colestipol 1 g tablet Commonly known as: COLESTID Take 2 g by mouth 2 (two) times daily.   dicyclomine 20 MG tablet Commonly known as: BENTYL Take 20 mg by mouth 2 (two) times daily as needed.   Eliquis  5 MG Tabs tablet Generic drug: apixaban Take 5 mg by mouth 2 (two) times daily.   hydrochlorothiazide 25 MG tablet Commonly known as: HYDRODIURIL Take 25 mg by mouth daily.   metoprolol succinate 25 MG 24 hr tablet Commonly known as: TOPROL-XL Take 25 mg by mouth daily.   sertraline 50 MG tablet Commonly known as: ZOLOFT Take 50 mg by mouth daily.   Systane 0.4-0.3 % Gel ophthalmic gel Generic drug: Polyethyl Glycol-Propyl Glycol Place 1 application into both eyes daily as needed (dry eyes).        Follow-up Information     Tracie Harrier, MD. Schedule an appointment as soon as  possible for a visit in 1 week(s).   Specialty: Internal Medicine Contact information: [address] [place] [address] [phone]                Discharge Exam: Danley Danker Weights   07/02/22 1328  Weight: 86 kg   General.  Well-developed elderly lady, in no acute distress. Pulmonary.  Lungs clear bilaterally, normal respiratory effort. CV.  Regular rate and rhythm, no JVD, rub or murmur. Abdomen.  Soft, nontender, nondistended, BS positive. CNS.  Alert and oriented .  No focal neurologic deficit. Extremities.  No edema, no cyanosis, pulses intact and symmetrical. Psychiatry.  Judgment and insight appears normal.   Condition at discharge: stable  The results of significant diagnostics from this hospitalization (including imaging, microbiology, ancillary and laboratory) are listed below for reference.   Imaging Studies: MR BRAIN WO CONTRAST  Result Date: 07/02/2022 CLINICAL DATA:  Acute neurologic deficit EXAM: MRI HEAD WITHOUT CONTRAST TECHNIQUE: Multiplanar, multiecho pulse sequences of the brain and surrounding structures were obtained without intravenous contrast. COMPARISON:  None Available. FINDINGS: Brain: No acute infarct, mass effect or extra-axial collection. No acute or chronic hemorrhage. Normal white matter signal, parenchymal volume and CSF spaces. The midline structures are normal. Vascular: Major flow voids are preserved. Skull and upper cervical spine: Normal calvarium and skull base. Visualized upper cervical spine and soft tissues are normal. Sinuses/Orbits:No paranasal sinus fluid levels or advanced mucosal thickening. No mastoid or middle ear effusion. Normal orbits. IMPRESSION: Normal brain MRI. Electronically Signed   By: Ulyses Jarred M.D.   On: 07/02/2022 19:58   CT ANGIO HEAD NECK W WO CM  Result Date: 07/02/2022 CLINICAL DATA:  Neuro deficit, acute, stroke suspected confusion EXAM: CT ANGIOGRAPHY HEAD AND NECK TECHNIQUE:  Multidetector CT imaging of the head and neck was performed using the standard protocol during bolus administration of intravenous contrast. Multiplanar CT image reconstructions and MIPs were obtained to evaluate the vascular anatomy. Carotid stenosis measurements (when applicable) are obtained utilizing NASCET criteria, using the distal internal carotid diameter as the denominator. RADIATION DOSE REDUCTION: This exam was performed according to the departmental dose-optimization program which includes automated exposure control, adjustment of the mA and/or kV according to patient size and/or use of iterative reconstruction technique. CONTRAST:  5m OMNIPAQUE IOHEXOL 350 MG/ML SOLN COMPARISON:  Same day CT head. FINDINGS: CTA NECK FINDINGS Aortic arch: Great vessel origins are patent without significant stenosis. Right carotid system: No evidence of dissection, stenosis (50% or greater), or occlusion. Left carotid system: No evidence of dissection, stenosis (50% or greater), or occlusion. Vertebral arteries: Codominant. No evidence of dissection, stenosis (50% or greater), or occlusion. Skeleton: No acute findings. Other neck: Approximately 1.7 cm left thyroid nodule. Upper chest: Visualized lung apices are clear. Review of the MIP images confirms the above findings  CTA HEAD FINDINGS Anterior circulation: Bilateral intracranial ICAs, MCAs, and ACAs are patent without proximal hemodynamically significant stenosis. Partially azygos ACA, anatomic variant. Posterior circulation: Bilateral intradural vertebral arteries, basilar artery, and bilateral posterior cerebral arteries are patent without proximal hemodynamically significant stenosis. Venous sinuses: As permitted by contrast timing, patent. Anatomic variants: Detailed above. Review of the MIP images confirms the above findings IMPRESSION: No emergent large vessel occlusion or proximal hemodynamically significant stenosis. Approximately 1.7 cm left thyroid nodule.  Recommend thyroid US (ref: J Am Coll Radiol. 2015 Feb;12(2): 143-50). Electronically Signed   By: Margaretha Sheffield M.D.   On: 07/02/2022 14:22   CT HEAD CODE STROKE WO CONTRAST  Result Date: 07/02/2022 CLINICAL DATA:  Code stroke.  Confusion EXAM: CT HEAD WITHOUT CONTRAST TECHNIQUE: Contiguous axial images were obtained from the base of the skull through the vertex without intravenous contrast. RADIATION DOSE REDUCTION: This exam was performed according to the departmental dose-optimization program which includes automated exposure control, adjustment of the mA and/or kV according to patient size and/or use of iterative reconstruction technique. COMPARISON:  None Available. FINDINGS: Brain: No evidence of acute infarction, hemorrhage, cerebral edema, mass, mass effect, or midline shift. No hydrocephalus or extra-axial collection. Vascular: No hyperdense vessel. Skull: Negative for fracture or focal lesion. Sinuses/Orbits: No acute finding. Status post bilateral lens replacements. Remote right lamina papyracea defect. Other: The mastoid air cells are well aerated. ASPECTS [facility] Stroke Program Early CT Score) - Ganglionic level infarction (caudate, lentiform nuclei, internal capsule, insula, M1-M3 cortex): 7 - Supraganglionic infarction (M4-M6 cortex): 3 Total score (0-10 with 10 being normal): 10 IMPRESSION: No acute intracranial process. ASPECTS is 10 Code stroke imaging results were communicated on 07/02/2022 at 1:49 pm to provider STAFFORD via telephone, who verbally acknowledged these results. Electronically Signed   By: Merilyn Baba M.D.   On: 07/02/2022 13:49   CT ABDOMEN PELVIS WO CONTRAST  Result Date: 07/02/2022 CLINICAL DATA:  Fatigue, headaches, dizziness, nausea and vomiting over the last week. EXAM: CT ABDOMEN AND PELVIS WITHOUT CONTRAST TECHNIQUE: Multidetector CT imaging of the abdomen and pelvis was performed following the standard protocol without IV contrast. RADIATION DOSE REDUCTION:  This exam was performed according to the departmental dose-optimization program which includes automated exposure control, adjustment of the mA and/or kV according to patient size and/or use of iterative reconstruction technique. COMPARISON:  Chest CTA 04/15/2021. FINDINGS: Lower chest: Mild atelectasis or scarring at both lung bases. Mild atherosclerosis of the aorta and coronary arteries. Apparent new occlusive device in the left atrial appendage. Hepatobiliary: The liver appears unremarkable as imaged in the noncontrast state. No evidence of significant biliary dilatation status post cholecystectomy. Pancreas: Unremarkable. No pancreatic ductal dilatation or surrounding inflammatory changes. Spleen: Normal in size without focal abnormality. Adrenals/Urinary Tract: Both adrenal glands appear normal. No evidence of urinary tract calculus, suspicious renal lesion or hydronephrosis. The bladder appears unremarkable for its degree of distention. Stomach/Bowel: No enteric contrast administered. The stomach appears unremarkable for its degree of distension. No evidence of bowel wall thickening, distention or surrounding inflammatory change. The appendix appears normal. Minimal distal colonic diverticulosis. Vascular/Lymphatic: There are no enlarged abdominal or pelvic lymph nodes. Mild aortic and branch vessel atherosclerosis. Reproductive: Status post hysterectomy. No evidence of adnexal mass. Probable pelvic floor laxity. Other: No evidence of abdominal wall mass or hernia. No ascites. Musculoskeletal: No acute or significant osseous findings. There is a nonacute appearing mild superior endplate compression deformity at L3-4, a grade 1 degenerative anterolisthesis at L4-5 and additional  mild multilevel spondylosis. IMPRESSION: 1. No acute findings or explanation for the patient's symptoms. 2. Minimal distal colonic diverticulosis. 3. Probable pelvic floor laxity. 4.  Aortic Atherosclerosis (ICD10-I70.0).  Electronically Signed   By: Richardean Sale M.D.   On: 07/02/2022 11:33    Microbiology: Results for orders placed or performed during the hospital encounter of 07/02/22  Resp Panel by RT-PCR (Flu A&B, Covid) Anterior Nasal Swab     Status: None   Collection Time: 07/03/22 11:25 AM   Specimen: Anterior Nasal Swab  Result Value Ref Range Status   SARS Coronavirus 2 by RT PCR NEGATIVE NEGATIVE Final    Comment: (NOTE) SARS-CoV-2 target nucleic acids are NOT DETECTED.  The SARS-CoV-2 RNA is generally detectable in upper respiratory specimens during the acute phase of infection. The lowest concentration of SARS-CoV-2 viral copies this assay can detect is 138 copies/mL. A negative result does not preclude SARS-Cov-2 infection and should not be used as the sole basis for treatment or other patient management decisions. A negative result may occur with  improper specimen collection/handling, submission of specimen other than nasopharyngeal swab, presence of viral mutation(s) within the areas targeted by this assay, and inadequate number of viral copies(<138 copies/mL). A negative result must be combined with clinical observations, patient history, and epidemiological information. The expected result is Negative.  Fact Sheet for Patients:  EntrepreneurPulse.com.au  Fact Sheet for Healthcare Providers:  IncredibleEmployment.be  This test is no t yet approved or cleared by the [place] FDA and  has been authorized for detection and/or diagnosis of SARS-CoV-2 by FDA under an Emergency Use Authorization (EUA). This EUA will remain  in effect (meaning this test can be used) for the duration of the COVID-19 declaration under Section 564(b)(1) of the Act, 21 U.S.C.section 360bbb-3(b)(1), unless the authorization is terminated  or revoked sooner.       Influenza A by PCR NEGATIVE NEGATIVE Final   Influenza B by PCR NEGATIVE NEGATIVE Final    Comment:  (NOTE) The Xpert Xpress SARS-CoV-2/FLU/RSV plus assay is intended as an aid in the diagnosis of influenza from Nasopharyngeal swab specimens and should not be used as a sole basis for treatment. Nasal washings and aspirates are unacceptable for Xpert Xpress SARS-CoV-2/FLU/RSV testing.  Fact Sheet for Patients: EntrepreneurPulse.com.au  Fact Sheet for Healthcare Providers: IncredibleEmployment.be  This test is not yet approved or cleared by the [place] FDA and has been authorized for detection and/or diagnosis of SARS-CoV-2 by FDA under an Emergency Use Authorization (EUA). This EUA will remain in effect (meaning this test can be used) for the duration of the COVID-19 declaration under Section 564(b)(1) of the Act, 21 U.S.C. section 360bbb-3(b)(1), unless the authorization is terminated or revoked.  Performed at [facility], [address] [place], [place] [address]     Labs: CBC: Recent Labs  Lab 07/02/22 1426 07/03/22 0754  WBC 6.4 4.7  NEUTROABS 4.7  --   HGB 12.7 11.9*  HCT 37.9 34.7*  MCV 91.3 91.6  PLT 198 786   Basic Metabolic Panel: Recent Labs  Lab 07/02/22 1426 07/03/22 0754  NA 132* 136  K 3.0* 3.3*  CL 93* 100  CO2 28 28  GLUCOSE 94 96  BUN 13 11  CREATININE 0.96 0.85  CALCIUM 8.6* 8.6*  MG  --  1.7   Liver Function Tests: Recent Labs  Lab 07/02/22 1426  AST 20  ALT 14  ALKPHOS 76  BILITOT 0.6  PROT 6.6  ALBUMIN 3.3*  CBG: Recent Labs  Lab 07/02/22 1333  GLUCAP 109*    Discharge time spent: greater than 30 minutes.  This record has been created using Systems analyst. Errors have been sought and corrected,but may not always be located. Such creation errors do not reflect on the standard of care.   Signed: Lorella Nimrod, MD [facility] 07/03/2022

## 2022-07-04 LAB — T3: T3, Total: 77 ng/dL (ref 71–180)

## 2022-07-07 DIAGNOSIS — R4182 Altered mental status, unspecified: Secondary | ICD-10-CM | POA: Diagnosis not present

## 2022-07-07 DIAGNOSIS — Z09 Encounter for follow-up examination after completed treatment for conditions other than malignant neoplasm: Secondary | ICD-10-CM | POA: Diagnosis not present

## 2022-07-07 DIAGNOSIS — I10 Essential (primary) hypertension: Secondary | ICD-10-CM | POA: Diagnosis not present

## 2022-07-07 DIAGNOSIS — I480 Paroxysmal atrial fibrillation: Secondary | ICD-10-CM | POA: Diagnosis not present

## 2022-07-08 DIAGNOSIS — E669 Obesity, unspecified: Secondary | ICD-10-CM | POA: Diagnosis not present

## 2022-07-08 DIAGNOSIS — I480 Paroxysmal atrial fibrillation: Secondary | ICD-10-CM | POA: Diagnosis not present

## 2022-07-08 DIAGNOSIS — R410 Disorientation, unspecified: Secondary | ICD-10-CM | POA: Diagnosis not present

## 2022-07-08 DIAGNOSIS — F419 Anxiety disorder, unspecified: Secondary | ICD-10-CM | POA: Diagnosis not present

## 2022-07-08 DIAGNOSIS — E782 Mixed hyperlipidemia: Secondary | ICD-10-CM | POA: Diagnosis not present

## 2022-07-08 DIAGNOSIS — I872 Venous insufficiency (chronic) (peripheral): Secondary | ICD-10-CM | POA: Diagnosis not present

## 2022-07-08 DIAGNOSIS — Z720 Tobacco use: Secondary | ICD-10-CM | POA: Diagnosis not present

## 2022-07-08 DIAGNOSIS — R519 Headache, unspecified: Secondary | ICD-10-CM | POA: Diagnosis not present

## 2022-07-08 DIAGNOSIS — E041 Nontoxic single thyroid nodule: Secondary | ICD-10-CM | POA: Diagnosis not present

## 2022-07-15 DIAGNOSIS — G2581 Restless legs syndrome: Secondary | ICD-10-CM | POA: Diagnosis not present

## 2022-07-15 DIAGNOSIS — R413 Other amnesia: Secondary | ICD-10-CM | POA: Diagnosis not present

## 2022-07-15 DIAGNOSIS — Z87440 Personal history of urinary (tract) infections: Secondary | ICD-10-CM | POA: Diagnosis not present

## 2022-07-15 DIAGNOSIS — R42 Dizziness and giddiness: Secondary | ICD-10-CM | POA: Diagnosis not present

## 2022-07-15 DIAGNOSIS — E041 Nontoxic single thyroid nodule: Secondary | ICD-10-CM | POA: Diagnosis not present

## 2022-07-15 DIAGNOSIS — I480 Paroxysmal atrial fibrillation: Secondary | ICD-10-CM | POA: Diagnosis not present

## 2022-07-15 DIAGNOSIS — I10 Essential (primary) hypertension: Secondary | ICD-10-CM | POA: Diagnosis not present

## 2022-07-15 DIAGNOSIS — R7989 Other specified abnormal findings of blood chemistry: Secondary | ICD-10-CM | POA: Diagnosis not present

## 2022-07-23 DIAGNOSIS — I480 Paroxysmal atrial fibrillation: Secondary | ICD-10-CM | POA: Diagnosis not present

## 2022-07-28 DIAGNOSIS — E041 Nontoxic single thyroid nodule: Secondary | ICD-10-CM | POA: Diagnosis not present

## 2022-07-28 DIAGNOSIS — Z6830 Body mass index (BMI) 30.0-30.9, adult: Secondary | ICD-10-CM | POA: Diagnosis not present

## 2022-07-28 DIAGNOSIS — F419 Anxiety disorder, unspecified: Secondary | ICD-10-CM | POA: Diagnosis not present

## 2022-07-28 DIAGNOSIS — M5441 Lumbago with sciatica, right side: Secondary | ICD-10-CM | POA: Diagnosis not present

## 2022-07-28 DIAGNOSIS — I10 Essential (primary) hypertension: Secondary | ICD-10-CM | POA: Diagnosis not present

## 2022-07-28 DIAGNOSIS — G4733 Obstructive sleep apnea (adult) (pediatric): Secondary | ICD-10-CM | POA: Diagnosis not present

## 2022-07-28 DIAGNOSIS — R7309 Other abnormal glucose: Secondary | ICD-10-CM | POA: Diagnosis not present

## 2022-07-28 DIAGNOSIS — Z1231 Encounter for screening mammogram for malignant neoplasm of breast: Secondary | ICD-10-CM | POA: Diagnosis not present

## 2022-07-28 DIAGNOSIS — E7800 Pure hypercholesterolemia, unspecified: Secondary | ICD-10-CM | POA: Diagnosis not present

## 2022-07-28 DIAGNOSIS — I480 Paroxysmal atrial fibrillation: Secondary | ICD-10-CM | POA: Diagnosis not present

## 2022-07-28 DIAGNOSIS — Z720 Tobacco use: Secondary | ICD-10-CM | POA: Diagnosis not present

## 2022-08-04 DIAGNOSIS — I480 Paroxysmal atrial fibrillation: Secondary | ICD-10-CM | POA: Diagnosis not present

## 2022-08-05 DIAGNOSIS — F325 Major depressive disorder, single episode, in full remission: Secondary | ICD-10-CM | POA: Diagnosis not present

## 2022-08-05 DIAGNOSIS — F17210 Nicotine dependence, cigarettes, uncomplicated: Secondary | ICD-10-CM | POA: Diagnosis not present

## 2022-08-05 DIAGNOSIS — K5900 Constipation, unspecified: Secondary | ICD-10-CM | POA: Diagnosis not present

## 2022-08-05 DIAGNOSIS — Z23 Encounter for immunization: Secondary | ICD-10-CM | POA: Diagnosis not present

## 2022-08-05 DIAGNOSIS — Z6828 Body mass index (BMI) 28.0-28.9, adult: Secondary | ICD-10-CM | POA: Diagnosis not present

## 2022-08-05 DIAGNOSIS — Z87891 Personal history of nicotine dependence: Secondary | ICD-10-CM | POA: Diagnosis not present

## 2022-08-05 DIAGNOSIS — Z0000 Encounter for general adult medical examination without abnormal findings: Secondary | ICD-10-CM | POA: Diagnosis not present

## 2022-08-05 DIAGNOSIS — Z1331 Encounter for screening for depression: Secondary | ICD-10-CM | POA: Diagnosis not present

## 2022-08-05 DIAGNOSIS — I119 Hypertensive heart disease without heart failure: Secondary | ICD-10-CM | POA: Diagnosis not present

## 2022-08-05 DIAGNOSIS — I480 Paroxysmal atrial fibrillation: Secondary | ICD-10-CM | POA: Diagnosis not present

## 2022-08-11 DIAGNOSIS — I10 Essential (primary) hypertension: Secondary | ICD-10-CM | POA: Diagnosis not present

## 2022-08-11 DIAGNOSIS — Z95818 Presence of other cardiac implants and grafts: Secondary | ICD-10-CM | POA: Diagnosis not present

## 2022-08-11 DIAGNOSIS — I480 Paroxysmal atrial fibrillation: Secondary | ICD-10-CM | POA: Diagnosis not present

## 2022-08-11 DIAGNOSIS — I2089 Other forms of angina pectoris: Secondary | ICD-10-CM | POA: Diagnosis not present

## 2022-08-11 DIAGNOSIS — E782 Mixed hyperlipidemia: Secondary | ICD-10-CM | POA: Diagnosis not present

## 2022-08-11 DIAGNOSIS — J439 Emphysema, unspecified: Secondary | ICD-10-CM | POA: Diagnosis not present

## 2022-08-11 DIAGNOSIS — E041 Nontoxic single thyroid nodule: Secondary | ICD-10-CM | POA: Diagnosis not present

## 2022-08-11 DIAGNOSIS — Z720 Tobacco use: Secondary | ICD-10-CM | POA: Diagnosis not present

## 2022-08-11 DIAGNOSIS — I872 Venous insufficiency (chronic) (peripheral): Secondary | ICD-10-CM | POA: Diagnosis not present

## 2022-08-11 DIAGNOSIS — I5032 Chronic diastolic (congestive) heart failure: Secondary | ICD-10-CM | POA: Diagnosis not present

## 2022-08-18 DIAGNOSIS — E041 Nontoxic single thyroid nodule: Secondary | ICD-10-CM | POA: Diagnosis not present

## 2022-08-20 DIAGNOSIS — T148XXA Other injury of unspecified body region, initial encounter: Secondary | ICD-10-CM | POA: Diagnosis not present

## 2022-10-15 DIAGNOSIS — G2581 Restless legs syndrome: Secondary | ICD-10-CM | POA: Diagnosis not present

## 2022-10-15 DIAGNOSIS — I10 Essential (primary) hypertension: Secondary | ICD-10-CM | POA: Diagnosis not present

## 2022-10-15 DIAGNOSIS — R413 Other amnesia: Secondary | ICD-10-CM | POA: Diagnosis not present

## 2022-10-15 DIAGNOSIS — I480 Paroxysmal atrial fibrillation: Secondary | ICD-10-CM | POA: Diagnosis not present

## 2022-10-15 DIAGNOSIS — R42 Dizziness and giddiness: Secondary | ICD-10-CM | POA: Diagnosis not present

## 2022-10-19 DIAGNOSIS — Z0100 Encounter for examination of eyes and vision without abnormal findings: Secondary | ICD-10-CM | POA: Diagnosis not present

## 2022-10-19 DIAGNOSIS — H524 Presbyopia: Secondary | ICD-10-CM | POA: Diagnosis not present

## 2022-11-30 DIAGNOSIS — I10 Essential (primary) hypertension: Secondary | ICD-10-CM | POA: Diagnosis not present

## 2022-11-30 DIAGNOSIS — I480 Paroxysmal atrial fibrillation: Secondary | ICD-10-CM | POA: Diagnosis not present

## 2022-11-30 DIAGNOSIS — K5900 Constipation, unspecified: Secondary | ICD-10-CM | POA: Diagnosis not present

## 2022-11-30 DIAGNOSIS — E049 Nontoxic goiter, unspecified: Secondary | ICD-10-CM | POA: Diagnosis not present

## 2022-11-30 DIAGNOSIS — R7309 Other abnormal glucose: Secondary | ICD-10-CM | POA: Diagnosis not present

## 2022-11-30 DIAGNOSIS — F325 Major depressive disorder, single episode, in full remission: Secondary | ICD-10-CM | POA: Diagnosis not present

## 2022-11-30 DIAGNOSIS — Z6828 Body mass index (BMI) 28.0-28.9, adult: Secondary | ICD-10-CM | POA: Diagnosis not present

## 2022-12-07 DIAGNOSIS — I480 Paroxysmal atrial fibrillation: Secondary | ICD-10-CM | POA: Diagnosis not present

## 2022-12-07 DIAGNOSIS — I5032 Chronic diastolic (congestive) heart failure: Secondary | ICD-10-CM | POA: Diagnosis not present

## 2022-12-07 DIAGNOSIS — R221 Localized swelling, mass and lump, neck: Secondary | ICD-10-CM | POA: Diagnosis not present

## 2022-12-07 DIAGNOSIS — E782 Mixed hyperlipidemia: Secondary | ICD-10-CM | POA: Diagnosis not present

## 2022-12-07 DIAGNOSIS — I10 Essential (primary) hypertension: Secondary | ICD-10-CM | POA: Diagnosis not present

## 2022-12-07 DIAGNOSIS — I872 Venous insufficiency (chronic) (peripheral): Secondary | ICD-10-CM | POA: Diagnosis not present

## 2022-12-07 DIAGNOSIS — F325 Major depressive disorder, single episode, in full remission: Secondary | ICD-10-CM | POA: Diagnosis not present

## 2022-12-07 DIAGNOSIS — J449 Chronic obstructive pulmonary disease, unspecified: Secondary | ICD-10-CM | POA: Diagnosis not present

## 2022-12-07 DIAGNOSIS — J439 Emphysema, unspecified: Secondary | ICD-10-CM | POA: Diagnosis not present

## 2022-12-07 DIAGNOSIS — Z720 Tobacco use: Secondary | ICD-10-CM | POA: Diagnosis not present

## 2022-12-07 DIAGNOSIS — G4733 Obstructive sleep apnea (adult) (pediatric): Secondary | ICD-10-CM | POA: Diagnosis not present

## 2022-12-07 DIAGNOSIS — R195 Other fecal abnormalities: Secondary | ICD-10-CM | POA: Diagnosis not present

## 2022-12-07 DIAGNOSIS — I2089 Other forms of angina pectoris: Secondary | ICD-10-CM | POA: Diagnosis not present

## 2022-12-07 DIAGNOSIS — Z95818 Presence of other cardiac implants and grafts: Secondary | ICD-10-CM | POA: Diagnosis not present

## 2022-12-07 DIAGNOSIS — F17210 Nicotine dependence, cigarettes, uncomplicated: Secondary | ICD-10-CM | POA: Diagnosis not present

## 2022-12-22 ENCOUNTER — Telehealth: Payer: Self-pay | Admitting: *Deleted

## 2022-12-22 NOTE — Telephone Encounter (Signed)
Spoke with pt to set up another lung cancer screening CT. PT states that she is getting ready to go out of town and would like to call us to schedule when she gets back. I gave her our direct extension to call back to schedule.

## 2023-02-04 DIAGNOSIS — I700 Atherosclerosis of aorta: Secondary | ICD-10-CM | POA: Insufficient documentation

## 2023-02-08 ENCOUNTER — Other Ambulatory Visit: Payer: Self-pay | Admitting: Family Medicine

## 2023-02-08 DIAGNOSIS — Z1231 Encounter for screening mammogram for malignant neoplasm of breast: Secondary | ICD-10-CM

## 2023-03-15 ENCOUNTER — Ambulatory Visit
Admission: RE | Admit: 2023-03-15 | Discharge: 2023-03-15 | Disposition: A | Discharge status: Home / Self Care | Payer: Medicare HMO | Source: Ambulatory Visit | Attending: Family Medicine | Admitting: Family Medicine

## 2023-03-15 DIAGNOSIS — Z1231 Encounter for screening mammogram for malignant neoplasm of breast: Secondary | ICD-10-CM | POA: Diagnosis present

## 2023-03-16 DIAGNOSIS — E042 Nontoxic multinodular goiter: Secondary | ICD-10-CM | POA: Insufficient documentation

## 2023-08-22 ENCOUNTER — Other Ambulatory Visit: Payer: Self-pay

## 2023-08-22 ENCOUNTER — Inpatient Hospital Stay
Admission: EM | Admit: 2023-08-22 | Discharge: 2023-08-24 | DRG: 287 | Disposition: A | Discharge status: Home / Self Care | Payer: Medicare HMO | Attending: Internal Medicine | Admitting: Internal Medicine

## 2023-08-22 ENCOUNTER — Emergency Department: Payer: Medicare HMO

## 2023-08-22 DIAGNOSIS — Z7982 Long term (current) use of aspirin: Secondary | ICD-10-CM

## 2023-08-22 DIAGNOSIS — Z9049 Acquired absence of other specified parts of digestive tract: Secondary | ICD-10-CM | POA: Diagnosis not present

## 2023-08-22 DIAGNOSIS — I5022 Chronic systolic (congestive) heart failure: Secondary | ICD-10-CM | POA: Diagnosis present

## 2023-08-22 DIAGNOSIS — E876 Hypokalemia: Secondary | ICD-10-CM | POA: Diagnosis present

## 2023-08-22 DIAGNOSIS — I480 Paroxysmal atrial fibrillation: Principal | ICD-10-CM

## 2023-08-22 DIAGNOSIS — R197 Diarrhea, unspecified: Secondary | ICD-10-CM | POA: Diagnosis present

## 2023-08-22 DIAGNOSIS — I428 Other cardiomyopathies: Secondary | ICD-10-CM | POA: Diagnosis present

## 2023-08-22 DIAGNOSIS — R079 Chest pain, unspecified: Secondary | ICD-10-CM | POA: Diagnosis present

## 2023-08-22 DIAGNOSIS — F32A Depression, unspecified: Secondary | ICD-10-CM | POA: Diagnosis present

## 2023-08-22 DIAGNOSIS — Z79899 Other long term (current) drug therapy: Secondary | ICD-10-CM | POA: Diagnosis not present

## 2023-08-22 DIAGNOSIS — F17210 Nicotine dependence, cigarettes, uncomplicated: Secondary | ICD-10-CM | POA: Diagnosis present

## 2023-08-22 DIAGNOSIS — I2489 Other forms of acute ischemic heart disease: Secondary | ICD-10-CM | POA: Diagnosis present

## 2023-08-22 DIAGNOSIS — I110 Hypertensive heart disease with heart failure: Secondary | ICD-10-CM | POA: Diagnosis present

## 2023-08-22 DIAGNOSIS — R9431 Abnormal electrocardiogram [ECG] [EKG]: Secondary | ICD-10-CM | POA: Diagnosis present

## 2023-08-22 DIAGNOSIS — E782 Mixed hyperlipidemia: Secondary | ICD-10-CM | POA: Insufficient documentation

## 2023-08-22 DIAGNOSIS — I4892 Unspecified atrial flutter: Secondary | ICD-10-CM | POA: Diagnosis present

## 2023-08-22 DIAGNOSIS — J439 Emphysema, unspecified: Secondary | ICD-10-CM | POA: Diagnosis present

## 2023-08-22 DIAGNOSIS — R519 Headache, unspecified: Secondary | ICD-10-CM | POA: Diagnosis present

## 2023-08-22 DIAGNOSIS — F419 Anxiety disorder, unspecified: Secondary | ICD-10-CM | POA: Diagnosis present

## 2023-08-22 DIAGNOSIS — K921 Melena: Secondary | ICD-10-CM | POA: Diagnosis present

## 2023-08-22 DIAGNOSIS — G2581 Restless legs syndrome: Secondary | ICD-10-CM | POA: Diagnosis present

## 2023-08-22 DIAGNOSIS — E785 Hyperlipidemia, unspecified: Secondary | ICD-10-CM | POA: Diagnosis present

## 2023-08-22 DIAGNOSIS — I10 Essential (primary) hypertension: Secondary | ICD-10-CM | POA: Diagnosis present

## 2023-08-22 DIAGNOSIS — Z95818 Presence of other cardiac implants and grafts: Secondary | ICD-10-CM

## 2023-08-22 DIAGNOSIS — Z01810 Encounter for preprocedural cardiovascular examination: Secondary | ICD-10-CM | POA: Diagnosis not present

## 2023-08-22 DIAGNOSIS — I214 Non-ST elevation (NSTEMI) myocardial infarction: Principal | ICD-10-CM | POA: Diagnosis present

## 2023-08-22 DIAGNOSIS — G4733 Obstructive sleep apnea (adult) (pediatric): Secondary | ICD-10-CM | POA: Diagnosis present

## 2023-08-22 LAB — COMPREHENSIVE METABOLIC PANEL
ALT: 15 U/L (ref 0–44)
AST: 20 U/L (ref 15–41)
Albumin: 3.9 g/dL (ref 3.5–5.0)
Alkaline Phosphatase: 69 U/L (ref 38–126)
Anion gap: 13 (ref 5–15)
BUN: 17 mg/dL (ref 8–23)
CO2: 26 mmol/L (ref 22–32)
Calcium: 9.4 mg/dL (ref 8.9–10.3)
Chloride: 99 mmol/L (ref 98–111)
Creatinine, Ser: 1.1 mg/dL — ABNORMAL HIGH (ref 0.44–1.00)
GFR, Estimated: 53 mL/min — ABNORMAL LOW (ref >60)
Glucose, Bld: 96 mg/dL (ref 70–99)
Potassium: 3.2 mmol/L — ABNORMAL LOW (ref 3.5–5.1)
Sodium: 138 mmol/L (ref 135–145)
Total Bilirubin: 0.7 mg/dL (ref <1.2)
Total Protein: 7.3 g/dL (ref 6.5–8.1)

## 2023-08-22 LAB — CBC WITH DIFFERENTIAL/PLATELET
Abs Immature Granulocytes: 0.01 10*3/uL (ref 0.00–0.07)
Basophils Absolute: 0 10*3/uL (ref 0.0–0.1)
Basophils Relative: 0 %
Eosinophils Absolute: 0.1 10*3/uL (ref 0.0–0.5)
Eosinophils Relative: 1 %
HCT: 43.8 % (ref 36.0–46.0)
Hemoglobin: 15 g/dL (ref 12.0–15.0)
Immature Granulocytes: 0 %
Lymphocytes Relative: 32 %
Lymphs Abs: 2.2 10*3/uL (ref 0.7–4.0)
MCH: 32.3 pg (ref 26.0–34.0)
MCHC: 34.2 g/dL (ref 30.0–36.0)
MCV: 94.2 fL (ref 80.0–100.0)
Monocytes Absolute: 0.3 10*3/uL (ref 0.1–1.0)
Monocytes Relative: 5 %
Neutro Abs: 4.1 10*3/uL (ref 1.7–7.7)
Neutrophils Relative %: 62 %
Platelets: 209 10*3/uL (ref 150–400)
RBC: 4.65 MIL/uL (ref 3.87–5.11)
RDW: 13.1 % (ref 11.5–15.5)
WBC: 6.7 10*3/uL (ref 4.0–10.5)
nRBC: 0 % (ref 0.0–0.2)

## 2023-08-22 LAB — MAGNESIUM: Magnesium: 2.4 mg/dL (ref 1.7–2.4)

## 2023-08-22 LAB — LIPASE, BLOOD: Lipase: 38 U/L (ref 11–51)

## 2023-08-22 LAB — TROPONIN I (HIGH SENSITIVITY): Troponin I (High Sensitivity): 237 ng/L (ref <18)

## 2023-08-22 MED ORDER — HEPARIN (PORCINE) 25000 UT/250ML-% IV SOLN
950.0000 [IU]/h | INTRAVENOUS | Status: DC
  Administered 2023-08-23: 950 [IU]/h via INTRAVENOUS
  Filled 2023-08-22: qty 250

## 2023-08-22 MED ORDER — METOPROLOL TARTRATE 5 MG/5ML IV SOLN
5.0000 mg | Freq: Once | INTRAVENOUS | Status: AC
  Administered 2023-08-22: 5 mg via INTRAVENOUS
  Filled 2023-08-22: qty 5

## 2023-08-22 MED ORDER — SODIUM CHLORIDE 0.9 % IV BOLUS
1000.0000 mL | Freq: Once | INTRAVENOUS | Status: AC
  Administered 2023-08-22: 1000 mL via INTRAVENOUS

## 2023-08-22 MED ORDER — HEPARIN BOLUS VIA INFUSION
4000.0000 [IU] | Freq: Once | INTRAVENOUS | Status: AC
  Administered 2023-08-23: 4000 [IU] via INTRAVENOUS
  Filled 2023-08-22: qty 4000

## 2023-08-22 NOTE — ED Provider Notes (Signed)
[facility] Provider Note    Event Date/Time   First MD Initiated Contact with Patient 08/22/23 2140     (approximate)   History   Chief Complaint: Chest Pain   HPI  Stephanie [name] is a 72 y.o. female with a history of hypertension, atrial fibrillation status post watchman implantation in 2023 who was in her usual state of health until 8:00 PM when she had abrupt onset of chest pressure and shortness of breath.  No diaphoresis or vomiting.  Nonradiating, no aggravating alleviating factors.  No fever or cough.  Does note that she has had poor fluid intake recently and has not eaten anything today.  She attributes this to chronic upset stomach and diarrhea that is been going on for several months.          Physical Exam   Triage Vital Signs: ED Triage Vitals  Encounter Vitals Group     BP 08/22/23 2133 (!) 134/93     Systolic BP Percentile --      Diastolic BP Percentile --      Pulse Rate 08/22/23 2136 (!) 125     Resp 08/22/23 2136 16     Temp 08/22/23 2133 97.9 F (36.6 C)     Temp Source 08/22/23 2133 Oral     SpO2 08/22/23 2136 99 %     Weight 08/22/23 2132 170 lb (77.1 kg)     Height 08/22/23 2132 5\' 4"  (1.626 m)     Head Circumference --      Peak Flow --      Pain Score 08/22/23 2132 0     Pain Loc --      Pain Education --      Exclude from Growth Chart --     Most recent vital signs: Vitals:   08/22/23 2230 08/22/23 2300  BP: 113/78 99/81  Pulse: (!) 101   Resp: 20 19  Temp:    SpO2: 98% 99%    General: Awake, no distress.  CV:  Good peripheral perfusion.  Irregular rhythm, heart rate 110-130 Resp:  Normal effort.  Clear to auscultation bilaterally Abd:  No distention.  Soft with epigastric tenderness Other:  No lower extremity edema   ED Results / Procedures / Treatments   Labs (all labs ordered are listed, but only abnormal results are displayed) Labs Reviewed  COMPREHENSIVE METABOLIC PANEL - Abnormal; Notable  for the following components:      Result Value   Potassium 3.2 (*)    Creatinine, Ser 1.10 (*)    GFR, Estimated 53 (*)    All other components within normal limits  TROPONIN I (HIGH SENSITIVITY) - Abnormal; Notable for the following components:   Troponin I (High Sensitivity) 237 (*)    All other components within normal limits  CBC WITH DIFFERENTIAL/PLATELET  LIPASE, BLOOD  MAGNESIUM  APTT  PROTIME-INR  HEPARIN LEVEL (UNFRACTIONATED)  TROPONIN I (HIGH SENSITIVITY)     EKG Interpreted by me Atrial fibrillation, rate of 121.  Normal axis, normal intervals.  Poor R wave progression.  Left bundle branch block.   RADIOLOGY Chest x-ray interpreted by me, appears normal.  Radiology report reviewed   PROCEDURES:  .Critical Care  Performed by: Sharman Cheek, MD Authorized by: Sharman Cheek, MD   Critical care provider statement:    Critical care time (minutes):  35   Critical care time was exclusive of:  Separately billable procedures and treating other patients   Critical care was necessary  to treat or prevent imminent or life-threatening deterioration of the following conditions:  Cardiac failure   Critical care was time spent personally by me on the following activities:  Development of treatment plan with patient or surrogate, discussions with consultants, evaluation of patient's response to treatment, examination of patient, obtaining history from patient or surrogate, ordering and performing treatments and interventions, ordering and review of laboratory studies, ordering and review of radiographic studies, pulse oximetry, re-evaluation of patient's condition and review of old charts   Care discussed with: admitting provider      MEDICATIONS ORDERED IN ED: Medications  heparin bolus via infusion 4,000 Units (has no administration in time range)  heparin ADULT infusion 100 units/mL (25000 units/250mL) (has no administration in time range)  sodium chloride 0.9 %  bolus 1,000 mL (1,000 mLs Intravenous New Bag/Given 08/22/23 2159)  metoprolol tartrate (LOPRESSOR) injection 5 mg (5 mg Intravenous Given 08/22/23 2158)     IMPRESSION / MDM / ASSESSMENT AND PLAN / ED COURSE  I reviewed the triage vital signs and the nursing notes.  DDx: Dehydration, paroxysmal atrial fibrillation, electrolyte abnormality, AKI, non-STEMI  Patient's presentation is most consistent with acute presentation with potential threat to life or bodily function.  Patient presents with chest pressure, shortness of breath, found to be in rapid A-fib.  Symptoms most likely due to symptomatic atrial fibrillation.  Will hydrate with IV fluids, give IV metoprolol bolus and check labs.    ----------------------------------------- 11:00 PM on 08/22/2023 ----------------------------------------- Troponin result is 230 concerning for NSTEMI with her chest pressure.  Chest pressure is now improved, vital signs are stable, she is in A-fib/flutter with a heart rate of about 100.  Will start heparin, pt took asa 324 PTA in ED. plan to admit.  ----------------------------------------- 11:15 PM on 08/22/2023 ----------------------------------------- Case discussed with hospitalist     FINAL CLINICAL IMPRESSION(S) / ED DIAGNOSES   Final diagnoses:  NSTEMI (non-ST elevated myocardial infarction) (HCC)     Rx / DC Orders   ED Discharge Orders     None        Note:  This document was prepared using Dragon voice recognition software and may include unintentional dictation errors.   Sharman Cheek, MD 08/22/23 [phone]

## 2023-08-22 NOTE — H&P (Signed)
[facility]   PATIENT NAME: Stephanie [name]    MR#:  [id number]  DATE OF BIRTH:  [birth year]  DATE OF ADMISSION:  08/22/2023  PRIMARY CARE PHYSICIAN: Leanord Asal, Nelva Bush, MD   Patient is coming from: Home  REQUESTING/REFERRING PHYSICIAN: Alfonse Flavors, MD  CHIEF COMPLAINT:   Chief Complaint  Patient presents with   Chest Pain    HISTORY OF PRESENT ILLNESS:  Stephanie [name] is a 72 y.o. Caucasian female with medical history significant for atrial fibrillation, osteoarthritis, hypertension and dyslipidemia, who presented to the emergency room with acute onset of midsternal chest pain, moderate in intensity and felt as: "Somebody hit her" with a feeling of funny left arm, as well as dyspnea and palpitations, headache and dizziness.  No nausea or vomiting or diaphoresis.  No cough or wheezing or hemoptysis.  No leg pain or edema or recent travels or surgeries.  ED Course: When the patient came to the ER BP was 134/93 with heart rate 125 with otherwise normal vital signs.  Labs revealed hypokalemia 3.2 and a creatinine of 1.1 with a high sensitive troponin I of 237 and later 1481.  CBC was within normal. EKG as reviewed by me : EKG showed atrial paced rhythm with a rate of 121 with multiple.  2 complexes ventricular and supraventricular.  Repeat EKG showed atrial flutter and fibrillation with controlled ventricular response of 98 with slightly prolonged QT interval. Imaging: Portable chest x-ray showed no acute cardiopulmonary disease.  The patient was given 1 L bolus of IV normal saline and 5 mg of IV Lopressor as well as IV heparin bolus and drip.  She will be admitted to a cardiac telemetry bed for further evaluation and management. PAST MEDICAL HISTORY:   Past Medical History:  Diagnosis Date   A-fib (HCC)    Arthritis    Chronic diarrhea    Diverticulosis    Dysrhythmia    ATRIAL FIB   Hyperlipidemia    Hypertension    Restless leg     PAST SURGICAL HISTORY:    Past Surgical History:  Procedure Laterality Date   CARDIOVERSION N/A 05/12/2018   Procedure: CARDIOVERSION;  Surgeon: Lamar Blinks, MD;  Location: [facility] ORS;  Service: Cardiovascular;  Laterality: N/A;   CARDIOVERSION N/A 06/19/2021   Procedure: CARDIOVERSION;  Surgeon: Alwyn Pea, MD;  Location: [facility] ORS;  Service: Cardiovascular;  Laterality: N/A;   CHOLECYSTECTOMY  2004   COLONOSCOPY WITH PROPOFOL N/A 05/24/2019   Procedure: COLONOSCOPY WITH PROPOFOL;  Surgeon: Toledo, Boykin Nearing, MD;  Location: [facility] ENDOSCOPY;  Service: Gastroenterology;  Laterality: N/A;   ESOPHAGOGASTRODUODENOSCOPY (EGD) WITH PROPOFOL N/A 05/24/2019   Procedure: ESOPHAGOGASTRODUODENOSCOPY (EGD) WITH PROPOFOL;  Surgeon: Toledo, Boykin Nearing, MD;  Location: [facility] ENDOSCOPY;  Service: Gastroenterology;  Laterality: N/A;   KNEE SURGERY     KNEE SURGERY     TEE WITHOUT CARDIOVERSION N/A 05/12/2018   Procedure: TRANSESOPHAGEAL ECHOCARDIOGRAM (TEE);  Surgeon: Lamar Blinks, MD;  Location: [facility] ORS;  Service: Cardiovascular;  Laterality: N/A;   TOE SURGERY      SOCIAL HISTORY:   Social History   Tobacco Use   Smoking status: Former    Current packs/day: 1.00    Average packs/day: 1 pack/day for 40.0 years (40.0 ttl pk-yrs)    Types: Cigarettes   Smokeless tobacco: Never  Substance Use Topics   Alcohol use: Not Currently    Comment: social    FAMILY HISTORY:   Family History  Problem Relation  Age of Onset   Breast cancer Neg Hx     DRUG ALLERGIES:   Allergies  Allergen Reactions   Codeine Other (See Comments)    Cannot tolerate alone, only when mixed with another medication. Other reaction(s): Other (See Comments) Makes patient feel spaced out. Patient has use codeine combined with other medications.   Lactulose     Other reaction(s): Other (See Comments) diarrhea   Lactose Intolerance (Gi)    Phenazopyridine Hcl Hives    REVIEW OF SYSTEMS:   ROS As per history of present illness. All  pertinent systems were reviewed above. Constitutional, HEENT, cardiovascular, respiratory, GI, GU, musculoskeletal, neuro, psychiatric, endocrine, integumentary and hematologic systems were reviewed and are otherwise negative/unremarkable except for positive findings mentioned above in the HPI.   MEDICATIONS AT HOME:   Prior to Admission medications   Medication Sig Start Date End Date Taking? Authorizing Provider  Ascorbic Acid (VITAMIN C PO) Take 1 tablet by mouth daily.   Yes [provider]  calcium citrate-vitamin D (CITRACAL+D) 315-200 MG-UNIT per tablet Take 1 tablet by mouth daily.    Yes [provider]  colestipol (COLESTID) 1 g tablet Take 1 g by mouth daily. 06/15/22  Yes [provider]  fluorometholone (FML) 0.1 % ophthalmic suspension Place 1 drop into both eyes 4 (four) times daily. 07/15/23  Yes [provider]  hydrochlorothiazide (HYDRODIURIL) 25 MG tablet Take 25 mg by mouth daily. 03/07/21  Yes [provider]  metoprolol succinate (TOPROL-XL) 25 MG 24 hr tablet Take 25 mg by mouth daily. 06/26/22  Yes [provider]  sertraline (ZOLOFT) 50 MG tablet Take 50 mg by mouth daily.   Yes [provider]  amiodarone (PACERONE) 100 MG tablet Take 1 tablet (100 mg total) by mouth daily. Please hold until see your cardiologist Patient not taking: Reported on 08/22/2023 07/03/22   [name], MD  dicyclomine (BENTYL) 20 MG tablet Take 20 mg by mouth 2 (two) times daily as needed. Patient not taking: Reported on 08/22/2023 06/12/22   [provider]  ELIQUIS 5 MG TABS tablet Take 5 mg by mouth 2 (two) times daily. Patient not taking: Reported on 08/22/2023 05/03/18   [provider]  Polyethyl Glycol-Propyl Glycol (SYSTANE) 0.4-0.3 % GEL ophthalmic gel Place 1 application into both eyes daily as needed (dry eyes). Patient not taking: Reported on 08/22/2023    [provider]      VITAL SIGNS:   Blood pressure 126/86, pulse 73, temperature (!) 97.5 F (36.4 C), temperature source Oral, resp. rate 14, height 5\' 4"  (1.626 m), weight 77.1 kg, SpO2 100%.  PHYSICAL EXAMINATION:  Physical Exam  GENERAL:  72 y.o.-year-old Caucasian female patient lying in the bed with no acute distress.  EYES: Pupils equal, round, reactive to light and accommodation. No scleral icterus. Extraocular muscles intact.  HEENT: Head atraumatic, normocephalic. Oropharynx and nasopharynx clear.  NECK:  Supple, no jugular venous distention. No thyroid enlargement, no tenderness.  LUNGS: Normal breath sounds bilaterally, no wheezing, rales,rhonchi or crepitation. No use of accessory muscles of respiration.  CARDIOVASCULAR: Irregularly irregular rhythm, S1, S2 normal. No murmurs, rubs, or gallops.  ABDOMEN: Soft, nondistended, nontender. Bowel sounds present. No organomegaly or mass.  EXTREMITIES: No pedal edema, cyanosis, or clubbing.  NEUROLOGIC: Cranial nerves II through XII are intact. Muscle strength 5/5 in all extremities. Sensation intact. Gait not checked.  PSYCHIATRIC: The patient is alert and oriented x 3.  Normal affect and good eye contact. SKIN: No  obvious rash, lesion, or ulcer.   LABORATORY PANEL:   CBC Recent Labs  Lab 08/23/23 0445  WBC 5.4  HGB 12.0  HCT 34.8*  PLT 155   ------------------------------------------------------------------------------------------------------------------  Chemistries  Recent Labs  Lab 08/22/23 2137 08/23/23 0445  NA 138 137  K 3.2* 3.0*  CL 99 107  CO2 26 25  GLUCOSE 96 90  BUN 17 15  CREATININE 1.10* 0.90  CALCIUM 9.4 7.8*  MG 2.4  --   AST 20  --   ALT 15  --   ALKPHOS 69  --   BILITOT 0.7  --    ------------------------------------------------------------------------------------------------------------------  Cardiac Enzymes No results for input(s): "TROPONINI" in the last 168  hours. ------------------------------------------------------------------------------------------------------------------  RADIOLOGY:  DG Chest Portable 1 View  Result Date: 08/22/2023 CLINICAL DATA:  Shortness of breath. Sudden onset chest pain, dizziness, and shortness of breath tonight. EXAM: PORTABLE CHEST 1 VIEW COMPARISON:  04/15/2021 FINDINGS: Postoperative changes in the heart. Heart size and pulmonary vascularity are normal. Lungs are clear. No pleural effusions. No pneumothorax. Mediastinal contours appear intact. Degenerative changes in the spine. IMPRESSION: No active disease. Electronically Signed   By: Burman Nieves M.D.   On: 08/22/2023 21:55      IMPRESSION AND PLAN:  Assessment and Plan: * NSTEMI (non-ST elevated myocardial infarction) [facility]) - The patient will be admitted to a cardiac telemetry bed. - Will follow serial troponins and EKGs. - The patient will be placed on aspirin as well as p.r.n. sublingual nitroglycerin and morphine sulfate for pain. - She will be placed on high-dose statin therapy and will check fasting lipids as well as continued beta-blocker therapy. - We will continue IV heparin to replace Eliquis. - We will obtain a cardiology consult and 2D echo. - I notified Dr. Juliann Pares about the patient   Paroxysmal atrial fibrillation with RVR (HCC) - We will continue Toprol-XL and amiodarone. - Eliquis is replaced with IV heparin.  Essential hypertension - We will continue antihypertensive therapy.  Dyslipidemia - The patient will be placed on high-dose statin as mentioned above.  Anxiety and depression - We will continue Zoloft.   DVT prophylaxis: IV heparin. Advanced Care Planning:  Code Status: full code. Family Communication:  The plan of care was discussed in details with the patient (and family). I answered all questions. The patient agreed to proceed with the above mentioned plan. Further management will depend upon hospital  course. Disposition Plan: Back to previous home environment Consults called: Cardioology All the records are reviewed and case discussed with ED provider.  Status is: Inpatient  At the time of the admission, it appears that the appropriate admission status for this patient is inpatient.  This is judged to be reasonable and necessary in order to provide the required intensity of service to ensure the patient's safety given the presenting symptoms, physical exam findings and initial radiographic and laboratory data in the context of comorbid conditions.  The patient requires inpatient status due to high intensity of service, high risk of further deterioration and high frequency of surveillance required.  I certify that at the time of admission, it is my clinical judgment that the patient will require inpatient hospital care extending more than 2 midnights.                            Dispo: The patient is from: Home              Anticipated  d/c is to: Home              Patient currently is not medically stable to d/c.              Difficult to place patient: No  Hannah Beat M.D on 08/23/2023 at 5:54 AM  [facility]   From 7 PM-7 AM, contact night-coverage [email or web address]  CC: Primary care physician; Leanord Asal, Nelva Bush, MD

## 2023-08-22 NOTE — Progress Notes (Signed)
ANTICOAGULATION CONSULT NOTE  Pharmacy Consult for heparin infusion Indication: ACS/STEMI  Allergies  Allergen Reactions   Codeine Other (See Comments)    Cannot tolerate alone, only when mixed with another medication. Other reaction(s): Other (See Comments) Makes patient feel spaced out. Patient has use codeine combined with other medications.   Lactulose     Other reaction(s): Other (See Comments) diarrhea   Lactose Intolerance (Gi)    Phenazopyridine Hcl Hives    Patient Measurements: Height: 5\' 4"  (162.6 cm) Weight: 77.1 kg (170 lb) IBW/kg (Calculated) : 54.7 Heparin Dosing Weight: 71 kg  Vital Signs: Temp: 97.9 F (36.6 C) (12/01 2133) Temp Source: Oral (12/01 2133) BP: 99/81 (12/01 2300) Pulse Rate: 101 (12/01 2230)  Labs: Recent Labs    08/22/23 2137  HGB 15.0  HCT 43.8  PLT 209  CREATININE 1.10*  TROPONINIHS 237*    Estimated Creatinine Clearance: 45.1 mL/min (A) (by C-G formula based on SCr of 1.1 mg/dL (H)).   Medical History: Past Medical History:  Diagnosis Date   A-fib [facility])    Arthritis    Chronic diarrhea    Diverticulosis    Dysrhythmia    ATRIAL FIB   Hyperlipidemia    Hypertension    Restless leg     Assessment: Pt is a 72 yo female presenting to ED c/o "of sudden onset of CP, dizziness, and SOB tonight at approx 2000. EMS reports pt was in afib with HR 130 and had taken 324 aspirin - denies blood thinners."  Pt found with elevated troponin I level.   Goal of Therapy:  Heparin level 0.3-0.7 units/ml Monitor platelets by anticoagulation protocol: Yes   Plan:  Bolus 4000 units x 1 Start heparin infusion at 950 units/hr Will check HL in 8 hr after start of infusion CBC daily while on heparin  Otelia Sergeant, PharmD, [facility] 08/22/2023 11:10 PM

## 2023-08-22 NOTE — ED Notes (Signed)
ERP notified of critical troponin

## 2023-08-22 NOTE — ED Triage Notes (Signed)
Pt arrives via [facility] with CC of sudden onset of CP, dizziness, and SOB tonight at approx 2000. EMS reports pt was in afib with HR 130 and had taken 324 aspirin - denies blood thinners. Denies CP at this time - reports cont SOB and dizziness.

## 2023-08-23 ENCOUNTER — Inpatient Hospital Stay: Payer: Medicare HMO

## 2023-08-23 ENCOUNTER — Encounter
Admission: EM | Disposition: A | Discharge status: Home / Self Care | Payer: Self-pay | Source: Home / Self Care | Attending: Internal Medicine

## 2023-08-23 ENCOUNTER — Inpatient Hospital Stay (HOSPITAL_COMMUNITY)
Admit: 2023-08-23 | Discharge: 2023-08-23 | Disposition: A | Discharge status: Home / Self Care | Payer: Medicare HMO | Attending: Student

## 2023-08-23 DIAGNOSIS — E782 Mixed hyperlipidemia: Secondary | ICD-10-CM | POA: Insufficient documentation

## 2023-08-23 DIAGNOSIS — I214 Non-ST elevation (NSTEMI) myocardial infarction: Secondary | ICD-10-CM | POA: Diagnosis not present

## 2023-08-23 DIAGNOSIS — E785 Hyperlipidemia, unspecified: Secondary | ICD-10-CM | POA: Insufficient documentation

## 2023-08-23 DIAGNOSIS — I480 Paroxysmal atrial fibrillation: Secondary | ICD-10-CM

## 2023-08-23 HISTORY — PX: LEFT HEART CATH AND CORONARY ANGIOGRAPHY: CATH118249

## 2023-08-23 LAB — ECHOCARDIOGRAM COMPLETE
AR max vel: 2.88 cm2
AV Area VTI: 2.85 cm2
AV Area mean vel: 2.13 cm2
AV Mean grad: 1 mm[Hg]
AV Peak grad: 2.2 mm[Hg]
Ao pk vel: 0.74 m/s
Area-P 1/2: 3.81 cm2
Height: 64 in
MV VTI: 1.7 cm2
S' Lateral: 3.8 cm
Weight: 2720 [oz_av]

## 2023-08-23 LAB — CBC
HCT: 34.8 % — ABNORMAL LOW (ref 36.0–46.0)
Hemoglobin: 12 g/dL (ref 12.0–15.0)
MCH: 32.2 pg (ref 26.0–34.0)
MCHC: 34.5 g/dL (ref 30.0–36.0)
MCV: 93.3 fL (ref 80.0–100.0)
Platelets: 155 10*3/uL (ref 150–400)
RBC: 3.73 MIL/uL — ABNORMAL LOW (ref 3.87–5.11)
RDW: 13.1 % (ref 11.5–15.5)
WBC: 5.4 10*3/uL (ref 4.0–10.5)
nRBC: 0 % (ref 0.0–0.2)

## 2023-08-23 LAB — BASIC METABOLIC PANEL
Anion gap: 5 (ref 5–15)
BUN: 15 mg/dL (ref 8–23)
CO2: 25 mmol/L (ref 22–32)
Calcium: 7.8 mg/dL — ABNORMAL LOW (ref 8.9–10.3)
Chloride: 107 mmol/L (ref 98–111)
Creatinine, Ser: 0.9 mg/dL (ref 0.44–1.00)
GFR, Estimated: >60 mL/min (ref >60)
Glucose, Bld: 90 mg/dL (ref 70–99)
Potassium: 3 mmol/L — ABNORMAL LOW (ref 3.5–5.1)
Sodium: 137 mmol/L (ref 135–145)

## 2023-08-23 LAB — PROTIME-INR
INR: 1 (ref 0.8–1.2)
Prothrombin Time: 13 s (ref 11.4–15.2)

## 2023-08-23 LAB — LIPID PANEL
Cholesterol: 148 mg/dL (ref 0–200)
HDL: 48 mg/dL (ref >40)
LDL Cholesterol: 90 mg/dL (ref 0–99)
Total CHOL/HDL Ratio: 3.1 {ratio}
Triglycerides: 49 mg/dL (ref <150)
VLDL: 10 mg/dL (ref 0–40)

## 2023-08-23 LAB — HEPARIN LEVEL (UNFRACTIONATED): Heparin Unfractionated: 0.5 [IU]/mL (ref 0.30–0.70)

## 2023-08-23 LAB — APTT: aPTT: 27 s (ref 24–36)

## 2023-08-23 LAB — TROPONIN I (HIGH SENSITIVITY)
Troponin I (High Sensitivity): 1481 ng/L (ref <18)
Troponin I (High Sensitivity): 3088 ng/L (ref <18)

## 2023-08-23 LAB — CARDIAC CATHETERIZATION: Cath EF Quantitative: 60 %

## 2023-08-23 SURGERY — LEFT HEART CATH AND CORONARY ANGIOGRAPHY
Anesthesia: Moderate Sedation

## 2023-08-23 MED ORDER — SODIUM CHLORIDE 0.9 % WEIGHT BASED INFUSION: 1.0000 mL/kg/h | INTRAVENOUS | Status: DC

## 2023-08-23 MED ORDER — SODIUM CHLORIDE 0.9% FLUSH: 3.0000 mL | Freq: Two times a day (BID) | INTRAVENOUS | Status: DC

## 2023-08-23 MED ORDER — IOHEXOL 350 MG/ML SOLN
60.0000 mL | Freq: Once | INTRAVENOUS | Status: AC | PRN
  Administered 2023-08-23: 60 mL via INTRAVENOUS

## 2023-08-23 MED ORDER — VERAPAMIL HCL 2.5 MG/ML IV SOLN
INTRAVENOUS | Status: AC
  Filled 2023-08-23: qty 2

## 2023-08-23 MED ORDER — TRAZODONE HCL 50 MG PO TABS: 25.0000 mg | ORAL_TABLET | Freq: Every evening | ORAL | Status: DC | PRN

## 2023-08-23 MED ORDER — ASPIRIN 300 MG RE SUPP: 300.0000 mg | RECTAL | Status: DC

## 2023-08-23 MED ORDER — ASPIRIN 81 MG PO TBEC
81.0000 mg | DELAYED_RELEASE_TABLET | Freq: Every day | ORAL | Status: DC
  Administered 2023-08-23 – 2023-08-24 (×2): 81 mg via ORAL
  Filled 2023-08-23 (×2): qty 1

## 2023-08-23 MED ORDER — CALCIUM CITRATE 950 (200 CA) MG PO TABS
200.0000 mg | ORAL_TABLET | Freq: Every day | ORAL | Status: DC
  Administered 2023-08-23 – 2023-08-24 (×2): 200 mg via ORAL
  Filled 2023-08-23 (×2): qty 1

## 2023-08-23 MED ORDER — MAGNESIUM HYDROXIDE 400 MG/5ML PO SUSP: 30.0000 mL | Freq: Every day | ORAL | Status: DC | PRN

## 2023-08-23 MED ORDER — FLUOROMETHOLONE 0.1 % OP SUSP
1.0000 [drp] | Freq: Four times a day (QID) | OPHTHALMIC | Status: DC
  Administered 2023-08-23 – 2023-08-24 (×3): 1 [drp] via OPHTHALMIC
  Filled 2023-08-23 (×3): qty 5

## 2023-08-23 MED ORDER — LIDOCAINE HCL 1 % IJ SOLN
INTRAMUSCULAR | Status: AC
  Filled 2023-08-23: qty 20

## 2023-08-23 MED ORDER — ALPRAZOLAM 0.25 MG PO TABS: 0.2500 mg | ORAL_TABLET | Freq: Two times a day (BID) | ORAL | Status: DC | PRN

## 2023-08-23 MED ORDER — IOHEXOL 300 MG/ML  SOLN
INTRAMUSCULAR | Status: DC | PRN
  Administered 2023-08-23: 65 mL

## 2023-08-23 MED ORDER — COLESTIPOL HCL 1 G PO TABS
1.0000 g | ORAL_TABLET | Freq: Every day | ORAL | Status: DC
  Administered 2023-08-23 – 2023-08-24 (×2): 1 g via ORAL
  Filled 2023-08-23 (×2): qty 1

## 2023-08-23 MED ORDER — MORPHINE SULFATE (PF) 2 MG/ML IV SOLN: 2.0000 mg | INTRAVENOUS | Status: DC | PRN

## 2023-08-23 MED ORDER — SODIUM CHLORIDE 0.9 % WEIGHT BASED INFUSION: 3.0000 mL/kg/h | INTRAVENOUS | Status: AC

## 2023-08-23 MED ORDER — VITAMIN D 25 MCG (1000 UNIT) PO TABS
500.0000 [IU] | ORAL_TABLET | Freq: Every day | ORAL | Status: DC
  Administered 2023-08-23 – 2023-08-24 (×2): 500 [IU] via ORAL
  Filled 2023-08-23 (×2): qty 1

## 2023-08-23 MED ORDER — HYDROCHLOROTHIAZIDE 25 MG PO TABS
25.0000 mg | ORAL_TABLET | Freq: Every day | ORAL | Status: DC
  Administered 2023-08-23: 25 mg via ORAL
  Filled 2023-08-23: qty 1

## 2023-08-23 MED ORDER — FENTANYL CITRATE (PF) 100 MCG/2ML IJ SOLN
INTRAMUSCULAR | Status: AC
  Filled 2023-08-23: qty 2

## 2023-08-23 MED ORDER — METOCLOPRAMIDE HCL 5 MG/ML IJ SOLN: 10.0000 mg | Freq: Four times a day (QID) | INTRAMUSCULAR | Status: DC | PRN

## 2023-08-23 MED ORDER — SODIUM CHLORIDE 0.9 % IV SOLN: INTRAVENOUS | Status: DC

## 2023-08-23 MED ORDER — LIDOCAINE HCL (PF) 1 % IJ SOLN
INTRAMUSCULAR | Status: DC | PRN
  Administered 2023-08-23: 2 mL

## 2023-08-23 MED ORDER — SERTRALINE HCL 50 MG PO TABS
50.0000 mg | ORAL_TABLET | Freq: Every day | ORAL | Status: DC
  Administered 2023-08-23 – 2023-08-24 (×2): 50 mg via ORAL
  Filled 2023-08-23 (×2): qty 1

## 2023-08-23 MED ORDER — ASPIRIN 81 MG PO CHEW: 324.0000 mg | CHEWABLE_TABLET | ORAL | Status: DC

## 2023-08-23 MED ORDER — SODIUM CHLORIDE 0.9% FLUSH: 3.0000 mL | INTRAVENOUS | Status: DC | PRN

## 2023-08-23 MED ORDER — ACETAMINOPHEN 325 MG PO TABS: 650.0000 mg | ORAL_TABLET | ORAL | Status: DC | PRN

## 2023-08-23 MED ORDER — METOPROLOL SUCCINATE ER 25 MG PO TB24: 25.0000 mg | ORAL_TABLET | Freq: Every day | ORAL | Status: DC

## 2023-08-23 MED ORDER — POTASSIUM CHLORIDE CRYS ER 20 MEQ PO TBCR
40.0000 meq | EXTENDED_RELEASE_TABLET | Freq: Two times a day (BID) | ORAL | Status: AC
  Administered 2023-08-23 (×2): 40 meq via ORAL
  Filled 2023-08-23 (×2): qty 2

## 2023-08-23 MED ORDER — HEPARIN (PORCINE) IN NACL 1000-0.9 UT/500ML-% IV SOLN
INTRAVENOUS | Status: DC | PRN
  Administered 2023-08-23 (×2): 500 mL

## 2023-08-23 MED ORDER — METOPROLOL SUCCINATE ER 50 MG PO TB24
50.0000 mg | ORAL_TABLET | Freq: Every day | ORAL | Status: DC
  Administered 2023-08-23 – 2023-08-24 (×2): 50 mg via ORAL
  Filled 2023-08-23 (×2): qty 1

## 2023-08-23 MED ORDER — HEPARIN SODIUM (PORCINE) 1000 UNIT/ML IJ SOLN
INTRAMUSCULAR | Status: AC
  Filled 2023-08-23: qty 10

## 2023-08-23 MED ORDER — MIDAZOLAM HCL 2 MG/2ML IJ SOLN
INTRAMUSCULAR | Status: DC | PRN
  Administered 2023-08-23: 1 mg via INTRAVENOUS

## 2023-08-23 MED ORDER — CALCIUM CITRATE-VITAMIN D 315-200 MG-UNIT PO TABS: 1.0000 | ORAL_TABLET | Freq: Every day | ORAL | Status: DC

## 2023-08-23 MED ORDER — FENTANYL CITRATE (PF) 100 MCG/2ML IJ SOLN
INTRAMUSCULAR | Status: DC | PRN
  Administered 2023-08-23: 25 ug via INTRAVENOUS

## 2023-08-23 MED ORDER — HEPARIN SODIUM (PORCINE) 1000 UNIT/ML IJ SOLN
INTRAMUSCULAR | Status: DC | PRN
  Administered 2023-08-23: 3500 [IU] via INTRAVENOUS

## 2023-08-23 MED ORDER — SODIUM CHLORIDE 0.9 % IV SOLN: 250.0000 mL | INTRAVENOUS | Status: DC | PRN

## 2023-08-23 MED ORDER — MIDAZOLAM HCL 2 MG/2ML IJ SOLN
INTRAMUSCULAR | Status: AC
Start: 2023-08-23 — End: ?
  Filled 2023-08-23: qty 2

## 2023-08-23 MED ORDER — LOPERAMIDE HCL 2 MG PO CAPS
2.0000 mg | ORAL_CAPSULE | Freq: Four times a day (QID) | ORAL | Status: DC | PRN
  Administered 2023-08-23: 2 mg via ORAL
  Filled 2023-08-23: qty 1

## 2023-08-23 MED ORDER — ATORVASTATIN CALCIUM 80 MG PO TABS
80.0000 mg | ORAL_TABLET | Freq: Every day | ORAL | Status: DC
  Administered 2023-08-23 – 2023-08-24 (×2): 80 mg via ORAL
  Filled 2023-08-23: qty 4
  Filled 2023-08-23: qty 1

## 2023-08-23 MED ORDER — VERAPAMIL HCL 2.5 MG/ML IV SOLN
INTRAVENOUS | Status: DC | PRN
  Administered 2023-08-23: 2.5 mg via INTRA_ARTERIAL

## 2023-08-23 MED ORDER — NITROGLYCERIN 0.4 MG SL SUBL: 0.4000 mg | SUBLINGUAL_TABLET | SUBLINGUAL | Status: DC | PRN

## 2023-08-23 MED ORDER — HEPARIN (PORCINE) IN NACL 1000-0.9 UT/500ML-% IV SOLN
INTRAVENOUS | Status: AC
  Filled 2023-08-23: qty 1000

## 2023-08-23 MED ORDER — LABETALOL HCL 5 MG/ML IV SOLN: 10.0000 mg | INTRAVENOUS | Status: AC | PRN

## 2023-08-23 SURGICAL SUPPLY — 8 items
CATH 5FR JL3.5 JR4 ANG PIG MP (CATHETERS) IMPLANT
DEVICE RAD TR BAND REGULAR (VASCULAR PRODUCTS) IMPLANT
DRAPE BRACHIAL (DRAPES) IMPLANT
GLIDESHEATH SLEND SS 6F .021 (SHEATH) IMPLANT
GUIDEWIRE INQWIRE 1.5J.035X260 (WIRE) IMPLANT
INQWIRE 1.5J .035X260CM (WIRE) ×1
PACK CARDIAC CATH (CUSTOM PROCEDURE TRAY) ×1 IMPLANT
WIRE HITORQ VERSACORE ST 145CM (WIRE) IMPLANT

## 2023-08-23 NOTE — Progress Notes (Signed)
*  PRELIMINARY RESULTS* Echocardiogram 2D Echocardiogram has been performed.  Carolyne Fiscal 08/23/2023, 10:24 AM

## 2023-08-23 NOTE — Consult Note (Signed)
[facility] CLINIC CARDIOLOGY CONSULT NOTE       Patient ID: Stephanie [name] MRN: [id number] DOB/AGE: 1951-09-12 72 y.o.  Admit date: 08/22/2023 Referring Physician Dr. Valente David Primary Physician Leanord Asal, Nelva Bush, MD  Primary Cardiologist Dr. Dorothyann Peng Reason for Consultation NSTEMI  HPI: Stephanie [name] is a 72 y.o. female  with a past medical history of paroxysmal atrial fibrillation s/p Watchman 05/2022, chronic HFmrEF, hypertension, hyperlipidemia, OSA on CPAP, former tobacco use, pulmonary emphysema who presented to the ED on 08/22/2023 for chest pain. Cardiology was consulted for further evaluation.   Patient reports that she was in her usual state of health yesterday.  States that she sat down to watch TV and after about an hour she had a sudden onset of chest pain.  She describes the pain as pressure and reports that it felt like "someone sitting on her chest".  At this time she called EMS and was brought to the ED for further evaluation.  Reports that the pain persisted until she arrived to the ED and gradually wore off.  Workup in the ED notable for creatinine 1.10, potassium 3.2, magnesium 2.4, hemoglobin 15.0, WBC 6.7.  Troponins trended 237 > 1481. EKG with atrial fibrillation RVR, nonspecific ST-T changes. She was started on IV heparin in the ED.    At the time of my evaluation this AM she was resting comfortably in ED stretcher. Denies any active CP. Has not had recurrence since last night. Discussed her episode in detail. She reports associated dizziness and headache which have both since resolved. Denies any palpitations, states she can usually tell when she is in atrial fibrillation but cannot tell currently. Denies SOB. Overall is feeling much better now.   Review of systems complete and found to be negative unless listed above    Past Medical History:  Diagnosis Date   A-fib (HCC)    Arthritis    Chronic diarrhea    Diverticulosis    Dysrhythmia    ATRIAL FIB    Hyperlipidemia    Hypertension    Restless leg     Past Surgical History:  Procedure Laterality Date   CARDIOVERSION N/A 05/12/2018   Procedure: CARDIOVERSION;  Surgeon: Lamar Blinks, MD;  Location: [facility] ORS;  Service: Cardiovascular;  Laterality: N/A;   CARDIOVERSION N/A 06/19/2021   Procedure: CARDIOVERSION;  Surgeon: Alwyn Pea, MD;  Location: [facility] ORS;  Service: Cardiovascular;  Laterality: N/A;   CHOLECYSTECTOMY  2004   COLONOSCOPY WITH PROPOFOL N/A 05/24/2019   Procedure: COLONOSCOPY WITH PROPOFOL;  Surgeon: Toledo, Boykin Nearing, MD;  Location: [facility] ENDOSCOPY;  Service: Gastroenterology;  Laterality: N/A;   ESOPHAGOGASTRODUODENOSCOPY (EGD) WITH PROPOFOL N/A 05/24/2019   Procedure: ESOPHAGOGASTRODUODENOSCOPY (EGD) WITH PROPOFOL;  Surgeon: Toledo, Boykin Nearing, MD;  Location: [facility] ENDOSCOPY;  Service: Gastroenterology;  Laterality: N/A;   KNEE SURGERY     KNEE SURGERY     TEE WITHOUT CARDIOVERSION N/A 05/12/2018   Procedure: TRANSESOPHAGEAL ECHOCARDIOGRAM (TEE);  Surgeon: Lamar Blinks, MD;  Location: [facility] ORS;  Service: Cardiovascular;  Laterality: N/A;   TOE SURGERY      (Not in a hospital admission)  Social History   Socioeconomic History   Marital status: Single    Spouse name: Not on file   Number of children: Not on file   Years of education: Not on file   Highest education level: Not on file  Occupational History   Not on file  Tobacco Use   Smoking status: Former  Current packs/day: 1.00    Average packs/day: 1 pack/day for 40.0 years (40.0 ttl pk-yrs)    Types: Cigarettes   Smokeless tobacco: Never  Vaping Use   Vaping status: Never Used  Substance and Sexual Activity   Alcohol use: Not Currently    Comment: social   Drug use: Never   Sexual activity: Never  Other Topics Concern   Not on file  Social History Narrative   Divorced   [place] moved from [place].   Originally from [place].   Works as a Occupational psychologist for [employer].    Enjoys visiting friends, walking.   Social Determinants of Health   Financial Resource Strain: High Risk (12/07/2022)   Received from [facility], [facility]   Overall Financial Resource Strain (CARDIA)    Difficulty of Paying Living Expenses: Hard  Food Insecurity: Food Insecurity Present (12/07/2022)   Received from [facility], [facility]   Hunger Vital Sign    Worried About Running Out of Food in the Last Year: Sometimes true    Ran Out of Food in the Last Year: Sometimes true  Transportation Needs: No Transportation Needs (12/07/2022)   Received from [facility], [facility]   [facility] - Transportation    In the past 12 months, has lack of transportation kept you from medical appointments or from getting medications?: No    Lack of Transportation (Non-Medical): No  Physical Activity: Not on file  Stress: Not on file  Social Connections: Not on file  Intimate Partner Violence: Not At Risk (07/03/2022)   Humiliation, Afraid, Rape, and Kick questionnaire    Fear of Current or Ex-Partner: No    Emotionally Abused: No    Physically Abused: No    Sexually Abused: No    Family History  Problem Relation Age of Onset   Breast cancer Neg Hx      Vitals:   08/23/23 0800 08/23/23 0830 08/23/23 0900 08/23/23 0930  BP: 121/73 125/65 126/77 130/71  Pulse: 96 73 82 73  Resp: 13 19 (!) 22 20  Temp:      TempSrc:      SpO2: 96% 98% 99% 96%  Weight:      Height:        PHYSICAL EXAM General: Well appearing female, well nourished, in no acute distress laying nearly flat in ED stretcher. HEENT: Normocephalic and atraumatic. Neck: No JVD.  Lungs: Normal respiratory effort on room air. Clear bilaterally to auscultation. No wheezes, crackles, rhonchi.  Heart: Irregularly irregular. Normal S1 and S2 without gallops or murmurs.  Abdomen: Non-distended appearing.  Msk: Normal  strength and tone for age. Extremities: Warm and well perfused. No clubbing, cyanosis. No` edema.  Neuro: Alert and oriented X 3. Psych: Answers questions appropriately.   Labs: Basic Metabolic Panel: Recent Labs    08/22/23 2137 08/23/23 0445  NA 138 137  K 3.2* 3.0*  CL 99 107  CO2 26 25  GLUCOSE 96 90  BUN 17 15  CREATININE 1.10* 0.90  CALCIUM 9.4 7.8*  MG 2.4  --    Liver Function Tests: Recent Labs    08/22/23 2137  AST 20  ALT 15  ALKPHOS 69  BILITOT 0.7  PROT 7.3  ALBUMIN 3.9   Recent Labs    08/22/23 2137  LIPASE 38   CBC: Recent Labs    08/22/23 2137 08/23/23 0445  WBC 6.7 5.4  NEUTROABS 4.1  --  HGB 15.0 12.0  HCT 43.8 34.8*  MCV 94.2 93.3  PLT 209 155   Cardiac Enzymes: Recent Labs    08/22/23 2137 08/22/23 2358 08/23/23 0445  TROPONINIHS 237* 1,481* 3,088*   BNP: No results for input(s): "BNP" in the last 72 hours. D-Dimer: No results for input(s): "DDIMER" in the last 72 hours. Hemoglobin A1C: No results for input(s): "HGBA1C" in the last 72 hours. Fasting Lipid Panel: Recent Labs    08/23/23 0445  CHOL 148  HDL 48  LDLCALC 90  TRIG 49  CHOLHDL 3.1   Thyroid Function Tests: No results for input(s): "TSH", "T4TOTAL", "T3FREE", "THYROIDAB" in the last 72 hours.  Invalid input(s): "FREET3" Anemia Panel: No results for input(s): "VITAMINB12", "FOLATE", "FERRITIN", "TIBC", "IRON", "RETICCTPCT" in the last 72 hours.   Radiology: DG Chest Portable 1 View  Result Date: 08/22/2023 CLINICAL DATA:  Shortness of breath. Sudden onset chest pain, dizziness, and shortness of breath tonight. EXAM: PORTABLE CHEST 1 VIEW COMPARISON:  04/15/2021 FINDINGS: Postoperative changes in the heart. Heart size and pulmonary vascularity are normal. Lungs are clear. No pleural effusions. No pneumothorax. Mediastinal contours appear intact. Degenerative changes in the spine. IMPRESSION: No active disease. Electronically Signed   By: Burman Nieves  M.D.   On: 08/22/2023 21:55    ECHO pending  TELEMETRY reviewed by me 08/23/2023: atrial fibrillation rate 80s  EKG reviewed by me: atrial fibrillation rate 121 bpm, nonspecific ST-T changes  Data reviewed by me 08/23/2023: last 24h vitals tele labs imaging I/O ED provider note, admission H&P  Principal Problem:   NSTEMI (non-ST elevated myocardial infarction) (HCC) Active Problems:   Essential hypertension   Anxiety and depression   Paroxysmal atrial fibrillation with RVR (HCC)   Dyslipidemia    ASSESSMENT AND PLAN:  Stephanie [name] is a 72 y.o. female  with a past medical history of paroxysmal atrial fibrillation s/p Watchman 05/2022, chronic HFmrEF, hypertension, hyperlipidemia, OSA on CPAP, former tobacco use, pulmonary emphysema who presented to the ED on 08/22/2023 for chest pain. Cardiology was consulted for further evaluation.   # NSTEMI # Hypertension # Hyperlipidemia Patient presented to ED after episode of CP. Troponins 237 > 1481 > 3088. EKG atrial fibrillation RVR, nonspecific ST-T changes. CP resolved in the ED, no recurrence.  -Continue heparin infusion.  -S/p ASA 325 with EMS. Continue atorvastatin 80 mg daily and aspirin 81 mg daily.  -Continue hydrochlorothiazide 25 mg daily and metoprolol as below. -Discussed the risks and benefits of proceeding with LHC for further evaluation with the patient.  He is agreeable to proceed.  NPO until LHC this afternoon (12/2) with Dr. Juliann Pares.  Written consent will be obtained.  Further recommendations following LHC.    # Paroxysmal atrial fibrillation Patient with hx of PAF, underwent Watchman implant 05/2022 and has been off of Eliquis since last year. HR initially elevated on admission now improved.  -Increase metoprolol to 50 mg daily.  TIMI Risk Score for Unstable Angina or Non-ST Elevation MI:   The patient's TIMI risk score is 5, which indicates a 26% risk of all cause mortality, new or recurrent myocardial infarction or need  for urgent revascularization in the next 14 days.   This patient's plan of care was discussed and created with Dr. Melton Alar and she is in agreement.  Signed: Gale Journey, PA-C  08/23/2023, 10:38 AM [facility] Cardiology

## 2023-08-23 NOTE — Assessment & Plan Note (Signed)
-   We will continue anti-hypertensive therapy. 

## 2023-08-23 NOTE — Assessment & Plan Note (Signed)
-   The patient will be placed on high-dose statin as mentioned above.

## 2023-08-23 NOTE — CV Procedure (Signed)
Brief post cath note  In Patient possible non-STEMI peak troponin was 3000  Right radial approach  Left ventriculogram Mild reduced left ventricular function globally EF around 45 to 50%  Coronaries Left main large short free of disease LAD large free of disease Circumflex large free of disease RCA large free of disease Right dominant system  No evidence of obstructive coronary disease Intervention deferred not indicated Recommend evaluation for alternate etiology of elevated troponin Spasm [facility] [place] Hard ischemia Leveda Anna PE Patient tolerated the procedure well No complications  Full cath note to follow [facility] Interventional cardiology

## 2023-08-23 NOTE — Progress Notes (Signed)
Dr. Juliann Pares speaking with pt. Grand daughter Stephanie [name] re: cath results. MD also spoke with pt. Directly. Both verbalize understanding of conversation.

## 2023-08-23 NOTE — Assessment & Plan Note (Signed)
-   We will continue Zoloft 

## 2023-08-23 NOTE — ED Notes (Signed)
Consent form not signed. Pt stated that a provider has not spoken to her about the risks and benefits of procedure. Consent form and patient labels will be sent with pt when pt goes to cath lab

## 2023-08-23 NOTE — Progress Notes (Signed)
ANTICOAGULATION CONSULT NOTE  Pharmacy Consult for heparin infusion Indication: ACS/STEMI  Allergies  Allergen Reactions   Codeine Other (See Comments)    Cannot tolerate alone, only when mixed with another medication. Other reaction(s): Other (See Comments) Makes patient feel spaced out. Patient has use codeine combined with other medications.   Lactulose     Other reaction(s): Other (See Comments) diarrhea   Lactose Intolerance (Gi)    Phenazopyridine Hcl Hives   Patient Measurements: Height: 5\' 4"  (162.6 cm) Weight: 77.1 kg (170 lb) IBW/kg (Calculated) : 54.7 Heparin Dosing Weight: 71 kg  Vital Signs: Temp: 97.5 F (36.4 C) (12/02 0725) Temp Source: Oral (12/02 0725) BP: 130/71 (12/02 0930) Pulse Rate: 73 (12/02 0930)  Labs: Recent Labs    08/22/23 2137 08/22/23 2358 08/23/23 0445 08/23/23 0846  HGB 15.0  --  12.0  --   HCT 43.8  --  34.8*  --   PLT 209  --  155  --   APTT 27  --   --   --   LABPROT 13.0  --   --   --   INR 1.0  --   --   --   HEPARINUNFRC  --   --   --  0.50  CREATININE 1.10*  --  0.90  --   TROPONINIHS 237* 1,481* 3,088*  --    Estimated Creatinine Clearance: 55.1 mL/min (by C-G formula based on SCr of 0.9 mg/dL).  Medical History: Past Medical History:  Diagnosis Date   A-fib [facility])    Arthritis    Chronic diarrhea    Diverticulosis    Dysrhythmia    ATRIAL FIB   Hyperlipidemia    Hypertension    Restless leg    Assessment: Pt is a 72 yo female presenting to ED c/o "of sudden onset of CP, dizziness, and SOB tonight at approx 2000. EMS reports pt was in afib with HR 130 and had taken 324 aspirin - denies blood thinners."  Pt found with elevated troponin I level.   Goal of Therapy:  Heparin level 0.3-0.7 units/ml Monitor platelets by anticoagulation protocol: Yes  Labs:  1202 0846 HL 0.50 - therapeutic x 1   Plan:  HL therapeutic x 1  Continue heparin infusion at 950 units/hr Recheck HL in 8 hours to confirm rate  Monitor  CBC daily while on heparin  Littie Deeds, PharmD Pharmacy Resident  08/23/2023 11:11 AM

## 2023-08-23 NOTE — ED Notes (Signed)
Echo at the bedside °

## 2023-08-23 NOTE — ED Notes (Signed)
Cardiology PA at bedside. 

## 2023-08-23 NOTE — ED Notes (Signed)
Pt ambulated to the in room toilet without assistance from staff. Pt returned to bed and reconnected to VS equipment. Pt tolerated activity well. Bed is in the lowest, locked position, with call bell in reach. Pt denies any other needs at this time.

## 2023-08-23 NOTE — Assessment & Plan Note (Signed)
-   We will continue Toprol-XL and amiodarone. - Eliquis is replaced with IV heparin.

## 2023-08-23 NOTE — Assessment & Plan Note (Addendum)
-   The patient will be admitted to a cardiac telemetry bed. - Will follow serial troponins and EKGs. - The patient will be placed on aspirin as well as p.r.n. sublingual nitroglycerin and morphine sulfate for pain. - She will be placed on high-dose statin therapy and will check fasting lipids as well as continued beta-blocker therapy. - We will continue IV heparin to replace Eliquis. - We will obtain a cardiology consult and 2D echo. - I notified Dr. Juliann Pares about the patient

## 2023-08-23 NOTE — Progress Notes (Signed)
[facility]  - [place] at [facility]   PATIENT NAME: Stephanie [name]    MR#:  [id number]  DATE OF BIRTH:  10-09-1950  SUBJECTIVE:  patient's daughter at bedside. Came in with chest pain while she was watching TV yesterday. Started on IV heparin drip. Patient ruled in for non-STEMI. Currently on IV heparin drip. Plans for cardiac cath per cardiology later today. Denies any chest pain today VITALS:  Blood pressure (!) 134/96, pulse 87, temperature (!) 97.5 F (36.4 C), temperature source Oral, resp. rate 15, height 5\' 4"  (1.626 m), weight 77.1 kg, SpO2 99%.  PHYSICAL EXAMINATION:   GENERAL:  72 y.o.-year-old patient with no acute distress.  LUNGS: Normal breath sounds bilaterally, no wheezing CARDIOVASCULAR: S1, S2 normal. Mild tachy ABDOMEN: Soft, nontender, nondistended. Bowel sounds present.  EXTREMITIES: No  edema b/l.    NEUROLOGIC: nonfocal  patient is alert and awake  LABORATORY PANEL:  CBC Recent Labs  Lab 08/23/23 0445  WBC 5.4  HGB 12.0  HCT 34.8*  PLT 155    Chemistries  Recent Labs  Lab 08/22/23 2137 08/23/23 0445  NA 138 137  K 3.2* 3.0*  CL 99 107  CO2 26 25  GLUCOSE 96 90  BUN 17 15  CREATININE 1.10* 0.90  CALCIUM 9.4 7.8*  MG 2.4  --   AST 20  --   ALT 15  --   ALKPHOS 69  --   BILITOT 0.7  --     DG Chest Portable 1 View  Result Date: 08/22/2023 CLINICAL DATA:  Shortness of breath. Sudden onset chest pain, dizziness, and shortness of breath tonight. EXAM: PORTABLE CHEST 1 VIEW COMPARISON:  04/15/2021 FINDINGS: Postoperative changes in the heart. Heart size and pulmonary vascularity are normal. Lungs are clear. No pleural effusions. No pneumothorax. Mediastinal contours appear intact. Degenerative changes in the spine. IMPRESSION: No active disease. Electronically Signed   By: Burman Nieves M.D.   On: 08/22/2023 21:55    Assessment and Plan   Stephanie [name] is a 43 y.o. Caucasian female with medical history significant for  atrial fibrillation, h/o Watchman's in 05/2022, osteoarthritis, hypertension and dyslipidemia, who presented to the emergency room with acute onset of midsternal chest pain, moderate in intensity and felt as: "Somebody hit her" with a feeling of funny left arm, as well as dyspnea and palpitations, headache and dizziness chest x-ray showed no acute cardiopulmonary disease.   NSTEMI (non-ST elevated myocardial infarction) (HCC) -- troponin 237-- 1400-- 3000 - on aspirin as well as p.r.n. sublingual nitroglycerin and morphine sulfate for pain. -cont statins --on asa, prn nitro, beta-blocker - lipid profile within normal limit [facility] cardiology input appreciated.-- Cardiac cath today -- continue heparin drip  Paroxysmal atrial fibrillation with RVR (HCC) H/o Watchman's procedure 05/2022 -  continue Toprol-XL  - Currently on heparin drip -- patient is not take eliquis or amiodarone   Essential hypertension - continue beta-blocker   Dyslipidemia - on statins  Anxiety and depression - continue Zoloft.   Procedures: Family communication :dter Consults :[facility] cardiology CODE STATUS: full DVT Prophylaxis :heparin gtt Level of care: Telemetry Cardiac Status is: Inpatient Remains inpatient appropriate because: NSTEMI    TOTAL TIME TAKING CARE OF THIS PATIENT: 35 minutes.  >50% time spent on counselling and coordination of care  Note: This dictation was prepared with Dragon dictation along with smaller phrase technology. Any transcriptional errors that result from this process are unintentional.  Enedina Finner M.D    [facility]  CC: Primary care physician; Leanord Asal, Nelva Bush, MD

## 2023-08-24 ENCOUNTER — Encounter: Payer: Self-pay | Admitting: Internal Medicine

## 2023-08-24 DIAGNOSIS — I214 Non-ST elevation (NSTEMI) myocardial infarction: Secondary | ICD-10-CM | POA: Diagnosis not present

## 2023-08-24 LAB — CBC
HCT: 34.4 % — ABNORMAL LOW (ref 36.0–46.0)
Hemoglobin: 11.9 g/dL — ABNORMAL LOW (ref 12.0–15.0)
MCH: 32.3 pg (ref 26.0–34.0)
MCHC: 34.6 g/dL (ref 30.0–36.0)
MCV: 93.5 fL (ref 80.0–100.0)
Platelets: 143 10*3/uL — ABNORMAL LOW (ref 150–400)
RBC: 3.68 MIL/uL — ABNORMAL LOW (ref 3.87–5.11)
RDW: 13.2 % (ref 11.5–15.5)
WBC: 5.1 10*3/uL (ref 4.0–10.5)
nRBC: 0 % (ref 0.0–0.2)

## 2023-08-24 LAB — BASIC METABOLIC PANEL
Anion gap: 6 (ref 5–15)
BUN: 15 mg/dL (ref 8–23)
CO2: 24 mmol/L (ref 22–32)
Calcium: 8.3 mg/dL — ABNORMAL LOW (ref 8.9–10.3)
Chloride: 109 mmol/L (ref 98–111)
Creatinine, Ser: 1.07 mg/dL — ABNORMAL HIGH (ref 0.44–1.00)
GFR, Estimated: 55 mL/min — ABNORMAL LOW (ref >60)
Glucose, Bld: 91 mg/dL (ref 70–99)
Potassium: 4.4 mmol/L (ref 3.5–5.1)
Sodium: 139 mmol/L (ref 135–145)

## 2023-08-24 LAB — LIPOPROTEIN A (LPA): Lipoprotein (a): 13.7 nmol/L (ref <75.0)

## 2023-08-24 MED ORDER — METOPROLOL SUCCINATE ER 50 MG PO TB24: 50.0000 mg | ORAL_TABLET | Freq: Every day | ORAL | 1 refills | Status: DC

## 2023-08-24 MED ORDER — AMIODARONE HCL 200 MG PO TABS: ORAL_TABLET | ORAL | 0 refills | Status: DC

## 2023-08-24 MED ORDER — AMIODARONE HCL 200 MG PO TABS: 200.0000 mg | ORAL_TABLET | Freq: Every day | ORAL | Status: DC

## 2023-08-24 MED ORDER — LOSARTAN POTASSIUM 25 MG PO TABS
25.0000 mg | ORAL_TABLET | Freq: Every day | ORAL | Status: DC
  Administered 2023-08-24: 25 mg via ORAL
  Filled 2023-08-24: qty 1

## 2023-08-24 MED ORDER — AMIODARONE HCL 200 MG PO TABS
400.0000 mg | ORAL_TABLET | Freq: Two times a day (BID) | ORAL | Status: DC
  Administered 2023-08-24: 400 mg via ORAL
  Filled 2023-08-24: qty 2

## 2023-08-24 MED ORDER — LOSARTAN POTASSIUM 25 MG PO TABS: 25.0000 mg | ORAL_TABLET | Freq: Every day | ORAL | 1 refills | Status: DC

## 2023-08-24 NOTE — Plan of Care (Signed)
  Problem: Education: Goal: Understanding of cardiac disease, CV risk reduction, and recovery process will improve Outcome: Progressing Goal: Individualized Educational Video(s) Outcome: Progressing   Problem: Activity: Goal: Ability to tolerate increased activity will improve Outcome: Progressing   Problem: Cardiac: Goal: Ability to achieve and maintain adequate cardiovascular perfusion will improve Outcome: Progressing   Problem: Health Behavior/Discharge Planning: Goal: Ability to safely manage health-related needs after discharge will improve Outcome: Progressing   Problem: Education: Goal: Knowledge of General Education information will improve Description: Including pain rating scale, medication(s)/side effects and non-pharmacologic comfort measures Outcome: Progressing   Problem: Health Behavior/Discharge Planning: Goal: Ability to manage health-related needs will improve Outcome: Progressing   Problem: Clinical Measurements: Goal: Ability to maintain clinical measurements within normal limits will improve Outcome: Progressing Goal: Will remain free from infection Outcome: Progressing Goal: Diagnostic test results will improve Outcome: Progressing Goal: Respiratory complications will improve Outcome: Progressing Goal: Cardiovascular complication will be avoided Outcome: Progressing   Problem: Activity: Goal: Risk for activity intolerance will decrease Outcome: Progressing   Problem: Nutrition: Goal: Adequate nutrition will be maintained Outcome: Progressing   Problem: Coping: Goal: Level of anxiety will decrease Outcome: Progressing   Problem: Elimination: Goal: Will not experience complications related to bowel motility Outcome: Progressing Goal: Will not experience complications related to urinary retention Outcome: Progressing   Problem: Pain Management: Goal: General experience of comfort will improve Outcome: Progressing   Problem:  Safety: Goal: Ability to remain free from injury will improve Outcome: Progressing   Problem: Skin Integrity: Goal: Risk for impaired skin integrity will decrease Outcome: Progressing   Problem: Education: Goal: Understanding of CV disease, CV risk reduction, and recovery process will improve Outcome: Progressing Goal: Individualized Educational Video(s) Outcome: Progressing   Problem: Activity: Goal: Ability to return to baseline activity level will improve Outcome: Progressing   Problem: Cardiovascular: Goal: Ability to achieve and maintain adequate cardiovascular perfusion will improve Outcome: Progressing Goal: Vascular access site(s) Level 0-1 will be maintained Outcome: Progressing   Problem: Health Behavior/Discharge Planning: Goal: Ability to safely manage health-related needs after discharge will improve Outcome: Progressing

## 2023-08-24 NOTE — Progress Notes (Signed)
[facility] CLINIC CARDIOLOGY PROGRESS NOTE       Patient ID: Stephanie [name] MRN: [id number] DOB/AGE: 06/11/1951 72 y.o.  Admit date: 08/22/2023 Referring Physician Dr. Valente David Primary Physician Leanord Asal, Nelva Bush, MD  Primary Cardiologist Dr. Dorothyann Peng Reason for Consultation NSTEMI  HPI: Stephanie [name] is a 72 y.o. female  with a past medical history of paroxysmal atrial fibrillation s/p Watchman 05/2022, chronic HFmrEF, hypertension, hyperlipidemia, OSA on CPAP, former tobacco use, pulmonary emphysema who presented to the ED on 08/22/2023 for chest pain. Cardiology was consulted for further evaluation.   Interval history: -Patient feeling well this AM, denies any chest pain or SOB.  -Remains in AF, rate averaging in the 90s. Denies any palpitations.  -Cath yesterday with no evidence of CAD, CT negative for PE.  -BP and HR well controlled.   Review of systems complete and found to be negative unless listed above    Past Medical History:  Diagnosis Date   A-fib (HCC)    Arthritis    Chronic diarrhea    Diverticulosis    Dysrhythmia    ATRIAL FIB   Hyperlipidemia    Hypertension    Restless leg     Past Surgical History:  Procedure Laterality Date   CARDIOVERSION N/A 05/12/2018   Procedure: CARDIOVERSION;  Surgeon: Lamar Blinks, MD;  Location: [facility] ORS;  Service: Cardiovascular;  Laterality: N/A;   CARDIOVERSION N/A 06/19/2021   Procedure: CARDIOVERSION;  Surgeon: Alwyn Pea, MD;  Location: [facility] ORS;  Service: Cardiovascular;  Laterality: N/A;   CHOLECYSTECTOMY  2004   COLONOSCOPY WITH PROPOFOL N/A 05/24/2019   Procedure: COLONOSCOPY WITH PROPOFOL;  Surgeon: Toledo, Boykin Nearing, MD;  Location: [facility] ENDOSCOPY;  Service: Gastroenterology;  Laterality: N/A;   ESOPHAGOGASTRODUODENOSCOPY (EGD) WITH PROPOFOL N/A 05/24/2019   Procedure: ESOPHAGOGASTRODUODENOSCOPY (EGD) WITH PROPOFOL;  Surgeon: Toledo, Boykin Nearing, MD;  Location: [facility] ENDOSCOPY;  Service:  Gastroenterology;  Laterality: N/A;   KNEE SURGERY     KNEE SURGERY     LEFT HEART CATH AND CORONARY ANGIOGRAPHY N/A 08/23/2023   Procedure: LEFT HEART CATH AND CORONARY ANGIOGRAPHY;  Surgeon: Alwyn Pea, MD;  Location: [facility] INVASIVE CV LAB;  Service: Cardiovascular;  Laterality: N/A;   TEE WITHOUT CARDIOVERSION N/A 05/12/2018   Procedure: TRANSESOPHAGEAL ECHOCARDIOGRAM (TEE);  Surgeon: Lamar Blinks, MD;  Location: [facility] ORS;  Service: Cardiovascular;  Laterality: N/A;   TOE SURGERY      Medications Prior to Admission  Medication Sig Dispense Refill Last Dose   Ascorbic Acid (VITAMIN C PO) Take 1 tablet by mouth daily.   08/22/2023 at am   calcium citrate-vitamin D (CITRACAL+D) 315-200 MG-UNIT per tablet Take 1 tablet by mouth daily.    08/22/2023 at am   colestipol (COLESTID) 1 g tablet Take 1 g by mouth daily.   08/22/2023 at am   fluorometholone (FML) 0.1 % ophthalmic suspension Place 1 drop into both eyes 4 (four) times daily.   Unknown at Unknown   hydrochlorothiazide (HYDRODIURIL) 25 MG tablet Take 25 mg by mouth daily.   08/22/2023 at am   metoprolol succinate (TOPROL-XL) 25 MG 24 hr tablet Take 25 mg by mouth daily.   08/22/2023 at am   sertraline (ZOLOFT) 50 MG tablet Take 50 mg by mouth daily.   08/22/2023 at am   Social History   Socioeconomic History   Marital status: Single    Spouse name: Not on file   Number of children: Not on file   Years of education:  Not on file   Highest education level: Not on file  Occupational History   Not on file  Tobacco Use   Smoking status: Former    Current packs/day: 1.00    Average packs/day: 1 pack/day for 40.0 years (40.0 ttl pk-yrs)    Types: Cigarettes   Smokeless tobacco: Never  Vaping Use   Vaping status: Never Used  Substance and Sexual Activity   Alcohol use: Not Currently    Comment: social   Drug use: Never   Sexual activity: Never  Other Topics Concern   Not on file  Social History Narrative   Divorced   [place]oved from [place].   Originally from [place].   Works as a Occupational psychologist for [employer].   Enjoys visiting friends, walking.   Social Determinants of Health   Financial Resource Strain: High Risk (12/07/2022)   Received from [facility], [facility]   Overall Financial Resource Strain (CARDIA)    Difficulty of Paying Living Expenses: Hard  Food Insecurity: No Food Insecurity (08/23/2023)   Hunger Vital Sign    Worried About Running Out of Food in the Last Year: Never true    Ran Out of Food in the Last Year: Never true  Transportation Needs: No Transportation Needs (08/23/2023)   PRAPARE - Administrator, Civil Service (Medical): No    Lack of Transportation (Non-Medical): No  Physical Activity: Not on file  Stress: Not on file  Social Connections: Not on file  Intimate Partner Violence: Not At Risk (08/23/2023)   Humiliation, Afraid, Rape, and Kick questionnaire    Fear of Current or Ex-Partner: No    Emotionally Abused: No    Physically Abused: No    Sexually Abused: No    Family History  Problem Relation Age of Onset   Breast cancer Neg Hx      Vitals:   08/23/23 1950 08/24/23 0501 08/24/23 0531 08/24/23 0850  BP: (!) 99/58 100/77 113/74 109/65  Pulse: 88 (!) 103 75 60  Resp: 19 19 18 18   Temp: 97.6 F (36.4 C) 97.7 F (36.5 C) 97.8 F (36.6 C) 97.7 F (36.5 C)  TempSrc: Oral Oral Oral Oral  SpO2: 100% 92% 97% 90%  Weight:      Height:        PHYSICAL EXAM General: Well appearing female, well nourished, in no acute distress laying nearly flat in hospital bed. HEENT: Normocephalic and atraumatic. Neck: No JVD.  Lungs: Normal respiratory effort on room air. Clear bilaterally to auscultation. No wheezes, crackles, rhonchi.  Heart: Irregularly irregular. Normal S1 and S2 without gallops or murmurs.  Abdomen: Non-distended appearing.  Msk: Normal strength and tone for age. Extremities: Warm and well  perfused. No clubbing, cyanosis. No` edema.  Neuro: Alert and oriented X 3. Psych: Answers questions appropriately.   Labs: Basic Metabolic Panel: Recent Labs    08/22/23 2137 08/23/23 0445 08/24/23 0554  NA 138 137 139  K 3.2* 3.0* 4.4  CL 99 107 109  CO2 26 25 24   GLUCOSE 96 90 91  BUN 17 15 15   CREATININE 1.10* 0.90 1.07*  CALCIUM 9.4 7.8* 8.3*  MG 2.4  --   --    Liver Function Tests: Recent Labs    08/22/23 2137  AST 20  ALT 15  ALKPHOS 69  BILITOT 0.7  PROT 7.3  ALBUMIN 3.9   Recent Labs    08/22/23 2137  LIPASE 38  CBC: Recent Labs    08/22/23 2137 08/23/23 0445 08/24/23 0554  WBC 6.7 5.4 5.1  NEUTROABS 4.1  --   --   HGB 15.0 12.0 11.9*  HCT 43.8 34.8* 34.4*  MCV 94.2 93.3 93.5  PLT 209 155 143*   Cardiac Enzymes: Recent Labs    08/22/23 2137 08/22/23 2358 08/23/23 0445  TROPONINIHS 237* 1,481* 3,088*   BNP: No results for input(s): "BNP" in the last 72 hours. D-Dimer: No results for input(s): "DDIMER" in the last 72 hours. Hemoglobin A1C: No results for input(s): "HGBA1C" in the last 72 hours. Fasting Lipid Panel: Recent Labs    08/23/23 0445  CHOL 148  HDL 48  LDLCALC 90  TRIG 49  CHOLHDL 3.1   Thyroid Function Tests: No results for input(s): "TSH", "T4TOTAL", "T3FREE", "THYROIDAB" in the last 72 hours.  Invalid input(s): "FREET3" Anemia Panel: No results for input(s): "VITAMINB12", "FOLATE", "FERRITIN", "TIBC", "IRON", "RETICCTPCT" in the last 72 hours.   Radiology: CT Angio Chest Pulmonary Embolism (PE) W or WO Contrast  Result Date: 08/23/2023 CLINICAL DATA:  Chronic dyspnea EXAM: CT ANGIOGRAPHY CHEST WITH CONTRAST TECHNIQUE: Multidetector CT imaging of the chest was performed using the standard protocol during bolus administration of intravenous contrast. Multiplanar CT image reconstructions and MIPs were obtained to evaluate the vascular anatomy. RADIATION DOSE REDUCTION: This exam was performed according to the  departmental dose-optimization program which includes automated exposure control, adjustment of the mA and/or kV according to patient size and/or use of iterative reconstruction technique. CONTRAST:  60mL OMNIPAQUE IOHEXOL 350 MG/ML SOLN COMPARISON:  Chest x-ray 08/22/2023, CT 04/15/2021 FINDINGS: Cardiovascular: Satisfactory opacification of the pulmonary arteries to the segmental level. No evidence of pulmonary embolism. Normal heart size. No pericardial effusion. Moderate aortic atherosclerosis. No aneurysm. Atrial appendage device. Cardiomegaly. No pericardial effusion. Biatrial enlargement. Mediastinum/Nodes: Patent trachea. 1.9 cm hypodense left thyroid nodule. Right low paratracheal node measuring 9 mm. AP window nodes up to 10 mm. Esophagus within normal limits. Lungs/Pleura: No acute airspace disease, pleural effusion, or pneumothorax. Upper Abdomen: No acute finding. Musculoskeletal: No acute osseous abnormality. Review of the MIP images confirms the above findings. IMPRESSION: 1. Negative for acute pulmonary embolus. 2. Cardiomegaly. 3. 1.9 cm hypodense left thyroid nodule. Recommend thyroid US (ref: J Am Coll Radiol. 2015 Feb;12(2): 143-50).This should be performed on a nonemergent basis. 4. Aortic atherosclerosis. Aortic Atherosclerosis (ICD10-I70.0). Electronically Signed   By: Jasmine Pang M.D.   On: 08/23/2023 21:34   CARDIAC CATHETERIZATION  Result Date: 08/23/2023   The left ventricular systolic function is normal.   LV end diastolic pressure is normal.   The left ventricular ejection fraction is 55-65% by visual estimate.   Anticipated discharge date to be determined.   Recommend uninterrupted dual antiplatelet therapy with Aspirin 81mg  daily and Clopidogrel 75mg  daily for a minimum of 6 months (stable ischemic heart disease-Class I recommendation). Conclusion Inpatient diagnostic cardiac cath because of elevated troponin possible non-STEMI Left ventriculogram showed normal left ventricular  function EF of 60% Coronaries Left main large free of disease LAD large free of disease Circumflex large free of disease RCA large free of disease Right dominant system No significant epicardial coronary disease intervention deferred not indicated Recommend evaluation for non ischemic source of chest pain and elevated troponin Discontinue heparin and anticoagulation Consider beta-blocker aspirin Plavix for 6 months Transferred patient back to telemetry Patient tolerated procedure well No complications   ECHOCARDIOGRAM COMPLETE  Result Date: 08/23/2023    ECHOCARDIOGRAM REPORT   Patient  Name:   JOMARA VOLKOV Date of Exam: 08/23/2023 Medical Rec #:  [id number]      Height:       64.0 in Accession #:    [id number]     Weight:       170.0 lb Date of Birth:  [birth year]      BSA:          1.826 m Patient Age:    72 years       BP:           128/75 mmHg Patient Gender: F              HR:           93 bpm. Exam Location:  [facility] Procedure: 2D Echo, Cardiac Doppler and Color Doppler Indications:     NSTEMI  History:         Patient has prior history of Echocardiogram examinations, most                  recent 07/03/2022. CHF, Acute MI, TIA, Arrythmias:Atrial                  Fibrillation; Risk Factors:Hypertension, Dyslipidemia and                  Current Smoker.  Sonographer:     Mikki Harbor Referring Phys:  [id number] Jeannene Tschetter Diagnosing Phys: Clotilde Dieter  Sonographer Comments: Technically difficult study due to poor echo windows and patient is obese. IMPRESSIONS  1. Left ventricular ejection fraction, by estimation, is 40 to 45%. Left ventricular ejection fraction by PLAX is 45 %. The left ventricle has mildly decreased function. The left ventricle demonstrates global hypokinesis. Left ventricular diastolic parameters are consistent with Grade I diastolic dysfunction (impaired relaxation).  2. Right ventricular systolic function is normal. The right ventricular size is normal. There is normal pulmonary  artery systolic pressure. The estimated right ventricular systolic pressure is 32.8 mmHg.  3. Left atrial size was mildly dilated.  4. Right atrial size was mildly dilated.  5. The mitral valve is degenerative. Moderate mitral valve regurgitation.  6. The tricuspid valve is degenerative. Tricuspid valve regurgitation is severe.  7. The aortic valve is normal in structure. Aortic valve regurgitation is not visualized. No aortic stenosis is present. FINDINGS  Left Ventricle: Left ventricular ejection fraction, by estimation, is 40 to 45%. Left ventricular ejection fraction by PLAX is 45 %. The left ventricle has mildly decreased function. The left ventricle demonstrates global hypokinesis. The left ventricular internal cavity size was normal in size. There is no left ventricular hypertrophy. Left ventricular diastolic parameters are consistent with Grade I diastolic dysfunction (impaired relaxation). Right Ventricle: The right ventricular size is normal. No increase in right ventricular wall thickness. Right ventricular systolic function is normal. There is normal pulmonary artery systolic pressure. The tricuspid regurgitant velocity is 2.73 m/s, and  with an assumed right atrial pressure of 3 mmHg, the estimated right ventricular systolic pressure is 32.8 mmHg. Left Atrium: Left atrial size was mildly dilated. Right Atrium: Right atrial size was mildly dilated. Pericardium: There is no evidence of pericardial effusion. Mitral Valve: The mitral valve is degenerative in appearance. Moderate mitral valve regurgitation. MV peak gradient, 3.0 mmHg. The mean mitral valve gradient is 1.0 mmHg. Tricuspid Valve: The tricuspid valve is degenerative in appearance. Tricuspid valve regurgitation is severe. Aortic Valve: The aortic valve is normal in structure. Aortic valve regurgitation is not visualized. No aortic stenosis is present.  Aortic valve mean gradient measures 1.0 mmHg. Aortic valve peak gradient measures 2.2 mmHg.  Aortic valve area, by VTI measures 2.85 cm. Pulmonic Valve: The pulmonic valve was grossly normal. Pulmonic valve regurgitation is not visualized. Aorta: The aortic root is normal in size and structure. IAS/Shunts: No atrial level shunt detected by color flow Doppler.  LEFT VENTRICLE PLAX 2D LV EF:         Left ventricular ejection fraction by PLAX is 45 %. LVIDd:         4.90 cm LVIDs:         3.80 cm LV PW:         1.40 cm LV IVS:        1.20 cm LVOT diam:     2.00 cm LV SV:         41 LV SV Index:   22 LVOT Area:     3.14 cm  RIGHT VENTRICLE RV Basal diam:  3.15 cm RV Mid diam:    3.30 cm RV S prime:     13.90 cm/s LEFT ATRIUM             Index        RIGHT ATRIUM           Index LA diam:        4.00 cm 2.19 cm/m   RA Area:     18.40 cm LA Vol (A2C):   57.8 ml 31.66 ml/m  RA Volume:   50.40 ml  27.60 ml/m LA Vol (A4C):   46.1 ml 25.25 ml/m LA Biplane Vol: 53.9 ml 29.52 ml/m  AORTIC VALVE                    PULMONIC VALVE AV Area (Vmax):    2.88 cm     PV Vmax:       0.71 m/s AV Area (Vmean):   2.13 cm     PV Peak grad:  2.0 mmHg AV Area (VTI):     2.85 cm AV Vmax:           73.70 cm/s AV Vmean:          51.250 cm/s AV VTI:            0.144 m AV Peak Grad:      2.2 mmHg AV Mean Grad:      1.0 mmHg LVOT Vmax:         67.50 cm/s LVOT Vmean:        34.700 cm/s LVOT VTI:          0.130 m LVOT/AV VTI ratio: 0.91  AORTA Ao Root diam: 3.20 cm MITRAL VALVE               TRICUSPID VALVE MV Area (PHT): 3.81 cm    TR Peak grad:   29.8 mmHg MV Area VTI:   1.70 cm    TR Vmax:        273.00 cm/s MV Peak grad:  3.0 mmHg MV Mean grad:  1.0 mmHg    SHUNTS MV Vmax:       0.87 m/s    Systemic VTI:  0.13 m MV Vmean:      43.1 cm/s   Systemic Diam: 2.00 cm MV Decel Time: 199 msec MV E velocity: 77.60 cm/s Rozell Searing Custovic Electronically signed by Clotilde Dieter Signature Date/Time: 08/23/2023/2:06:26 PM    Final    DG Chest Portable 1 View  Result Date: 08/22/2023 CLINICAL  DATA:  Shortness of breath. Sudden onset chest  pain, dizziness, and shortness of breath tonight. EXAM: PORTABLE CHEST 1 VIEW COMPARISON:  04/15/2021 FINDINGS: Postoperative changes in the heart. Heart size and pulmonary vascularity are normal. Lungs are clear. No pleural effusions. No pneumothorax. Mediastinal contours appear intact. Degenerative changes in the spine. IMPRESSION: No active disease. Electronically Signed   By: Burman Nieves M.D.   On: 08/22/2023 21:55    ECHO as above  TELEMETRY reviewed by me 08/24/2023: atrial fibrillation rate 90s  EKG reviewed by me: atrial fibrillation rate 121 bpm, nonspecific ST-T changes  Data reviewed by me 08/24/2023: last 24h vitals tele labs imaging I/O hospitalist progress note, cath report  Principal Problem:   NSTEMI (non-ST elevated myocardial infarction) (HCC) Active Problems:   Essential hypertension   Anxiety and depression   Paroxysmal atrial fibrillation with RVR (HCC)   Dyslipidemia    ASSESSMENT AND PLAN:  Stephanie [name] is a 72 y.o. female  with a past medical history of paroxysmal atrial fibrillation s/p Watchman 05/2022, chronic HFmrEF, hypertension, hyperlipidemia, OSA on CPAP, former tobacco use, pulmonary emphysema who presented to the ED on 08/22/2023 for chest pain. Cardiology was consulted for further evaluation.   # NSTEMI # Non-ischemic cardiomyopathy. # Hypertension # Hyperlipidemia Patient presented to ED after episode of CP. Troponins 237 > 1481 > 3088. EKG atrial fibrillation RVR, nonspecific ST-T changes. CP resolved in the ED, no recurrence. Echo with EF 40-45%. LHC 08/23/23 with no evidence of significant coronary disease. CTA chest negative for PE.  -Continue heparin infusion.  -S/p ASA 325 with EMS. Continue atorvastatin 80 mg daily and aspirin 81 mg daily.  -Discontinue hydrochlorothiazide and start losartan 25 mg daily. Continue metoprolol succinate 50 mg daily. Further additions to GDMT limited by BP. Plan to make adjustments to regimen at outpatient  appointment.  -No clear cause of troponin elevation or reduced EF given essentially normal coronaries and negative PE study. Possibly related to AF RVR.   # Paroxysmal atrial fibrillation Patient with hx of PAF, underwent Watchman implant 05/2022 and has been off of Eliquis since last year. HR initially elevated on admission now improved.  -Start po amiodarone load at 400 mg bid for 7 days then cut to 200 mg daily.  -Continue metoprolol 50 mg daily for rate control.  Ok for discharge today from a cardiac perspective. Will arrange for follow up in clinic with Dr. Juliann Pares in 1-2 weeks.    This patient's plan of care was discussed and created with Dr. Melton Alar and she is in agreement.  Signed: Gale Journey, PA-C  08/24/2023, 9:01 AM [facility] Cardiology

## 2023-08-24 NOTE — Discharge Summary (Addendum)
Physician Discharge Summary   Patient: Stephanie [name] MRN: [id number] DOB: [birth year]  Admit date:     08/22/2023  Discharge date: 08/24/23  Discharge Physician: Enedina Finner   PCP: Leanord Asal, Nelva Bush, MD   Recommendations at discharge:   follow-up Dr. Juliann Pares in 1 to 2 weeks follow-up PCP in 1 to 2 weeks  Discharge Diagnoses: Principal Problem:   NSTEMI (non-ST elevated myocardial infarction) [facility]) Active Problems:   Paroxysmal atrial fibrillation with RVR (HCC)   Essential hypertension   Anxiety and depression   Dyslipidemia  Stephanie [name] is a 57 y.o. Caucasian female with medical history significant for atrial fibrillation, h/o Watchman's in 05/2022, osteoarthritis, hypertension and dyslipidemia, who presented to the emergency room with acute onset of midsternal chest pain, moderate in intensity and felt as: "Somebody hit her" with a feeling of funny left arm, as well as dyspnea and palpitations, headache and dizziness chest x-ray showed no acute cardiopulmonary disease.    NSTEMI (non-ST elevated myocardial infarction) (HCC) non-ischemic cardiomyopathy EF 4245% by cardiac cath -- troponin 237-- 1400-- 3000 - on aspirin as well as p.r.n. sublingual nitroglycerin and morphine sulfate for pain. -cont statins --on asa, prn nitro, beta-blocker - lipid profile within normal limit [facility] cardiology input appreciated.-- Cardiac cath with no evidence of significant coronary disease. -- CTA chest negative for PE.  -- d/c heparin drip -- no clear cause for troponin elevated or reduced EF given essentially normal coronaries are negative PE study. Probably related to a fib with RVR   Paroxysmal atrial fibrillation with RVR (HCC) H/o Watchman's procedure 05/2022 -  continue Toprol-XL dose increase to 50 mg -- patient started on amiodarone by cardiology. HR 80's now -- Continue aspirin   Essential hypertension - continue beta-blocker -- discontinue hydrochlorothiazide. --  Started on losartan as part of GDMT   Dyslipidemia - on colestipol   Anxiety and depression - continue Zoloft.   Spoke with patient's granddaughter [name] on the phone. Updated above results and medication changes. Advised follow-up with cardiology closely. Daughter reported patient had blood in stools yesterday. She saw some blood in the toilet in the ER. Discussed with RN today and patient reported very small/drop of blood yesterday. None today. No history of constipation. Has diverticulosis on colonoscopy done four years ago. Hemoglobin stable. Defer to PCP for G.I. workup if send symptoms continue. Avoid constipation. Family ok with pt discharging.   Consultants: [facility] cardiology Procedures performed: [facility]  Disposition: Home Diet recommendation:  Discharge Diet Orders (From admission, onward)     Start     Ordered   08/24/23 0000  Diet - low sodium heart healthy        08/24/23 1123           Cardiac diet DISCHARGE MEDICATION: Allergies as of 08/24/2023       Reactions   Codeine Other (See Comments)   Cannot tolerate alone, only when mixed with another medication. Other reaction(s): Other (See Comments) Makes patient feel spaced out. Patient has use codeine combined with other medications.   Lactulose    Other reaction(s): Other (See Comments) diarrhea   Lactose Intolerance (gi)    Phenazopyridine Hcl Hives        Medication List     STOP taking these medications    hydrochlorothiazide 25 MG tablet Commonly known as: HYDRODIURIL       TAKE these medications    amiodarone 200 MG tablet Commonly known as: PACERONE Take 2 tablets (400 mg total) by  mouth 2 (two) times daily for 7 days, THEN 1 tablet (200 mg total) daily. Start taking on: August 24, 2023   calcium citrate-vitamin D 315-200 MG-UNIT tablet Commonly known as: CITRACAL+D Take 1 tablet by mouth daily.   colestipol 1 g tablet Commonly known as: COLESTID Take 1 g by mouth daily.    fluorometholone 0.1 % ophthalmic suspension Commonly known as: FML Place 1 drop into both eyes 4 (four) times daily.   losartan 25 MG tablet Commonly known as: COZAAR Take 1 tablet (25 mg total) by mouth daily. Start taking on: August 25, 2023   metoprolol succinate 50 MG 24 hr tablet Commonly known as: TOPROL-XL Take 1 tablet (50 mg total) by mouth daily. Take with or immediately following a meal. Start taking on: August 25, 2023 What changed:  medication strength how much to take additional instructions   sertraline 50 MG tablet Commonly known as: ZOLOFT Take 50 mg by mouth daily.   VITAMIN C PO Take 1 tablet by mouth daily.        Follow-up Information     Alwyn Pea, MD. Go in 1 week(s).   Specialties: Cardiology, Internal Medicine Contact information: [address] [place] [address] [phone]         Leanord Asal, Nelva Bush, MD. Schedule an appointment as soon as possible for a visit in 1 week(s).   Specialty: Family Medicine Contact information: [address] [address] [phone]                Discharge Exam: [name] Weights   08/22/23 2132 08/23/23 1617  Weight: 77.1 kg 71.8 kg   GENERAL:  72 y.o.-year-old patient with no acute distress.  LUNGS: Normal breath sounds bilaterally, no wheezing CARDIOVASCULAR: S1, S2 normal. Mild tachy ABDOMEN: Soft, nontender, nondistended. Bowel sounds present.  EXTREMITIES: No  edema b/l.    NEUROLOGIC: nonfocal  patient is alert and awake  Condition at discharge: fair  The results of significant diagnostics from this hospitalization (including imaging, microbiology, ancillary and laboratory) are listed below for reference.   Imaging Studies: CT Angio Chest Pulmonary Embolism (PE) W or WO Contrast  Result Date: 08/23/2023 CLINICAL DATA:  Chronic dyspnea EXAM: CT ANGIOGRAPHY CHEST WITH CONTRAST TECHNIQUE: Multidetector CT imaging of the chest was performed using  the standard protocol during bolus administration of intravenous contrast. Multiplanar CT image reconstructions and MIPs were obtained to evaluate the vascular anatomy. RADIATION DOSE REDUCTION: This exam was performed according to the departmental dose-optimization program which includes automated exposure control, adjustment of the mA and/or kV according to patient size and/or use of iterative reconstruction technique. CONTRAST:  60mL OMNIPAQUE IOHEXOL 350 MG/ML SOLN COMPARISON:  Chest x-ray 08/22/2023, CT 04/15/2021 FINDINGS: Cardiovascular: Satisfactory opacification of the pulmonary arteries to the segmental level. No evidence of pulmonary embolism. Normal heart size. No pericardial effusion. Moderate aortic atherosclerosis. No aneurysm. Atrial appendage device. Cardiomegaly. No pericardial effusion. Biatrial enlargement. Mediastinum/Nodes: Patent trachea. 1.9 cm hypodense left thyroid nodule. Right low paratracheal node measuring 9 mm. AP window nodes up to 10 mm. Esophagus within normal limits. Lungs/Pleura: No acute airspace disease, pleural effusion, or pneumothorax. Upper Abdomen: No acute finding. Musculoskeletal: No acute osseous abnormality. Review of the MIP images confirms the above findings. IMPRESSION: 1. Negative for acute pulmonary embolus. 2. Cardiomegaly. 3. 1.9 cm hypodense left thyroid nodule. Recommend thyroid US (ref: J Am Coll Radiol. 2015 Feb;12(2): 143-50).This should be performed on a nonemergent basis. 4. Aortic atherosclerosis. Aortic Atherosclerosis (ICD10-I70.0).  Electronically Signed   By: Jasmine Pang M.D.   On: 08/23/2023 21:34   CARDIAC CATHETERIZATION  Result Date: 08/23/2023   The left ventricular systolic function is normal.   LV end diastolic pressure is normal.   The left ventricular ejection fraction is 55-65% by visual estimate.   Anticipated discharge date to be determined.   Recommend uninterrupted dual antiplatelet therapy with Aspirin 81mg  daily and Clopidogrel  75mg  daily for a minimum of 6 months (stable ischemic heart disease-Class I recommendation). Conclusion Inpatient diagnostic cardiac cath because of elevated troponin possible non-STEMI Left ventriculogram showed normal left ventricular function EF of 60% Coronaries Left main large free of disease LAD large free of disease Circumflex large free of disease RCA large free of disease Right dominant system No significant epicardial coronary disease intervention deferred not indicated Recommend evaluation for non ischemic source of chest pain and elevated troponin Discontinue heparin and anticoagulation Consider beta-blocker aspirin Plavix for 6 months Transferred patient back to telemetry Patient tolerated procedure well No complications   ECHOCARDIOGRAM COMPLETE  Result Date: 08/23/2023    ECHOCARDIOGRAM REPORT   Patient Name:   YANNELI Blankenburg Date of Exam: 08/23/2023 Medical Rec #:  [id number]      Height:       64.0 in Accession #:    [id number]     Weight:       170.0 lb Date of Birth:  28-Apr-1951      BSA:          1.826 m Patient Age:    72 years       BP:           128/75 mmHg Patient Gender: F              HR:           93 bpm. Exam Location:  [facility] Procedure: 2D Echo, Cardiac Doppler and Color Doppler Indications:     NSTEMI  History:         Patient has prior history of Echocardiogram examinations, most                  recent 07/03/2022. CHF, Acute MI, TIA, Arrythmias:Atrial                  Fibrillation; Risk Factors:Hypertension, Dyslipidemia and                  Current Smoker.  Sonographer:     Mikki Harbor Referring Phys:  [id number] CARALYN HUDSON Diagnosing Phys: Clotilde Dieter  Sonographer Comments: Technically difficult study due to poor echo windows and patient is obese. IMPRESSIONS  1. Left ventricular ejection fraction, by estimation, is 40 to 45%. Left ventricular ejection fraction by PLAX is 45 %. The left ventricle has mildly decreased function. The left ventricle demonstrates global  hypokinesis. Left ventricular diastolic parameters are consistent with Grade I diastolic dysfunction (impaired relaxation).  2. Right ventricular systolic function is normal. The right ventricular size is normal. There is normal pulmonary artery systolic pressure. The estimated right ventricular systolic pressure is 32.8 mmHg.  3. Left atrial size was mildly dilated.  4. Right atrial size was mildly dilated.  5. The mitral valve is degenerative. Moderate mitral valve regurgitation.  6. The tricuspid valve is degenerative. Tricuspid valve regurgitation is severe.  7. The aortic valve is normal in structure. Aortic valve regurgitation is not visualized. No aortic stenosis is present. FINDINGS  Left Ventricle: Left ventricular ejection fraction, by estimation, is 40  to 45%. Left ventricular ejection fraction by PLAX is 45 %. The left ventricle has mildly decreased function. The left ventricle demonstrates global hypokinesis. The left ventricular internal cavity size was normal in size. There is no left ventricular hypertrophy. Left ventricular diastolic parameters are consistent with Grade I diastolic dysfunction (impaired relaxation). Right Ventricle: The right ventricular size is normal. No increase in right ventricular wall thickness. Right ventricular systolic function is normal. There is normal pulmonary artery systolic pressure. The tricuspid regurgitant velocity is 2.73 m/s, and  with an assumed right atrial pressure of 3 mmHg, the estimated right ventricular systolic pressure is 32.8 mmHg. Left Atrium: Left atrial size was mildly dilated. Right Atrium: Right atrial size was mildly dilated. Pericardium: There is no evidence of pericardial effusion. Mitral Valve: The mitral valve is degenerative in appearance. Moderate mitral valve regurgitation. MV peak gradient, 3.0 mmHg. The mean mitral valve gradient is 1.0 mmHg. Tricuspid Valve: The tricuspid valve is degenerative in appearance. Tricuspid valve regurgitation  is severe. Aortic Valve: The aortic valve is normal in structure. Aortic valve regurgitation is not visualized. No aortic stenosis is present. Aortic valve mean gradient measures 1.0 mmHg. Aortic valve peak gradient measures 2.2 mmHg. Aortic valve area, by VTI measures 2.85 cm. Pulmonic Valve: The pulmonic valve was grossly normal. Pulmonic valve regurgitation is not visualized. Aorta: The aortic root is normal in size and structure. IAS/Shunts: No atrial level shunt detected by color flow Doppler.  LEFT VENTRICLE PLAX 2D LV EF:         Left ventricular ejection fraction by PLAX is 45 %. LVIDd:         4.90 cm LVIDs:         3.80 cm LV PW:         1.40 cm LV IVS:        1.20 cm LVOT diam:     2.00 cm LV SV:         41 LV SV Index:   22 LVOT Area:     3.14 cm  RIGHT VENTRICLE RV Basal diam:  3.15 cm RV Mid diam:    3.30 cm RV S prime:     13.90 cm/s LEFT ATRIUM             Index        RIGHT ATRIUM           Index LA diam:        4.00 cm 2.19 cm/m   RA Area:     18.40 cm LA Vol (A2C):   57.8 ml 31.66 ml/m  RA Volume:   50.40 ml  27.60 ml/m LA Vol (A4C):   46.1 ml 25.25 ml/m LA Biplane Vol: 53.9 ml 29.52 ml/m  AORTIC VALVE                    PULMONIC VALVE AV Area (Vmax):    2.88 cm     PV Vmax:       0.71 m/s AV Area (Vmean):   2.13 cm     PV Peak grad:  2.0 mmHg AV Area (VTI):     2.85 cm AV Vmax:           73.70 cm/s AV Vmean:          51.250 cm/s AV VTI:            0.144 m AV Peak Grad:      2.2 mmHg AV Mean Grad:  1.0 mmHg LVOT Vmax:         67.50 cm/s LVOT Vmean:        34.700 cm/s LVOT VTI:          0.130 m LVOT/AV VTI ratio: 0.91  AORTA Ao Root diam: 3.20 cm MITRAL VALVE               TRICUSPID VALVE MV Area (PHT): 3.81 cm    TR Peak grad:   29.8 mmHg MV Area VTI:   1.70 cm    TR Vmax:        273.00 cm/s MV Peak grad:  3.0 mmHg MV Mean grad:  1.0 mmHg    SHUNTS MV Vmax:       0.87 m/s    Systemic VTI:  0.13 m MV Vmean:      43.1 cm/s   Systemic Diam: 2.00 cm MV Decel Time: 199 msec MV E  velocity: 77.60 cm/s Rozell Searing Custovic Electronically signed by Clotilde Dieter Signature Date/Time: 08/23/2023/2:06:26 PM    Final    DG Chest Portable 1 View  Result Date: 08/22/2023 CLINICAL DATA:  Shortness of breath. Sudden onset chest pain, dizziness, and shortness of breath tonight. EXAM: PORTABLE CHEST 1 VIEW COMPARISON:  04/15/2021 FINDINGS: Postoperative changes in the heart. Heart size and pulmonary vascularity are normal. Lungs are clear. No pleural effusions. No pneumothorax. Mediastinal contours appear intact. Degenerative changes in the spine. IMPRESSION: No active disease. Electronically Signed   By: Burman Nieves M.D.   On: 08/22/2023 21:55    Microbiology: Results for orders placed or performed during the hospital encounter of 07/02/22  Resp Panel by RT-PCR (Flu A&B, Covid) Anterior Nasal Swab     Status: None   Collection Time: 07/03/22 11:25 AM   Specimen: Anterior Nasal Swab  Result Value Ref Range Status   SARS Coronavirus 2 by RT PCR NEGATIVE NEGATIVE Final    Comment: (NOTE) SARS-CoV-2 target nucleic acids are NOT DETECTED.  The SARS-CoV-2 RNA is generally detectable in upper respiratory specimens during the acute phase of infection. The lowest concentration of SARS-CoV-2 viral copies this assay can detect is 138 copies/mL. A negative result does not preclude SARS-Cov-2 infection and should not be used as the sole basis for treatment or other patient management decisions. A negative result may occur with  improper specimen collection/handling, submission of specimen other than nasopharyngeal swab, presence of viral mutation(s) within the areas targeted by this assay, and inadequate number of viral copies(<138 copies/mL). A negative result must be combined with clinical observations, patient history, and epidemiological information. The expected result is Negative.  Fact Sheet for Patients:  BloggerCourse.com  Fact Sheet for Healthcare  Providers:  SeriousBroker.it  This test is no t yet approved or cleared by the [place] FDA and  has been authorized for detection and/or diagnosis of SARS-CoV-2 by FDA under an Emergency Use Authorization (EUA). This EUA will remain  in effect (meaning this test can be used) for the duration of the COVID-19 declaration under Section 564(b)(1) of the Act, 21 U.S.C.section 360bbb-3(b)(1), unless the authorization is terminated  or revoked sooner.       Influenza A by PCR NEGATIVE NEGATIVE Final   Influenza B by PCR NEGATIVE NEGATIVE Final    Comment: (NOTE) The Xpert Xpress SARS-CoV-2/FLU/RSV plus assay is intended as an aid in the diagnosis of influenza from Nasopharyngeal swab specimens and should not be used as a sole basis for treatment. Nasal washings and aspirates are unacceptable for Xpert  Xpress SARS-CoV-2/FLU/RSV testing.  Fact Sheet for Patients: BloggerCourse.com  Fact Sheet for Healthcare Providers: SeriousBroker.it  This test is not yet approved or cleared by the [place] FDA and has been authorized for detection and/or diagnosis of SARS-CoV-2 by FDA under an Emergency Use Authorization (EUA). This EUA will remain in effect (meaning this test can be used) for the duration of the COVID-19 declaration under Section 564(b)(1) of the Act, 21 U.S.C. section 360bbb-3(b)(1), unless the authorization is terminated or revoked.  Performed at [facility], [address] [place], [place] [address]     Labs: CBC: Recent Labs  Lab 08/22/23 2137 08/23/23 0445 08/24/23 0554  WBC 6.7 5.4 5.1  NEUTROABS 4.1  --   --   HGB 15.0 12.0 11.9*  HCT 43.8 34.8* 34.4*  MCV 94.2 93.3 93.5  PLT 209 155 143*   Basic Metabolic Panel: Recent Labs  Lab 08/22/23 2137 08/23/23 0445 08/24/23 0554  NA 138 137 139  K 3.2* 3.0* 4.4  CL 99 107 109  CO2 26 25 24   GLUCOSE 96 90 91  BUN  17 15 15   CREATININE 1.10* 0.90 1.07*  CALCIUM 9.4 7.8* 8.3*  MG 2.4  --   --    Liver Function Tests: Recent Labs  Lab 08/22/23 2137  AST 20  ALT 15  ALKPHOS 69  BILITOT 0.7  PROT 7.3  ALBUMIN 3.9    Discharge time spent: greater than 30 minutes.  Signed: Enedina Finner, MD [facility] 08/24/2023

## 2023-08-24 NOTE — TOC CM/SW Note (Signed)
Transition of Care [facility]) - Inpatient Brief Assessment   Patient Details  Name: Stephanie [name] MRN: [id number] Date of Birth: [birth year]  [facility]) CM/SW Contact:    Liliana Cline, LCSW Phone Number: 08/24/2023, 8:44 AM  Transition of Care Asessment: Insurance and Status: Insurance coverage has been reviewed Patient has primary care physician: Yes     Prior/Current Home Services: No current home services Social Determinants of Health Reivew: SDOH reviewed no interventions necessary Readmission risk has been reviewed: Yes Transition of care needs: no transition of care needs at this time

## 2023-08-24 NOTE — Progress Notes (Signed)
Pt being d/c, VSS, IV removed, education complete.   Balinda Quails, RN 08/24/2023 11:32 AM

## 2023-08-25 ENCOUNTER — Telehealth: Payer: Self-pay

## 2023-08-25 NOTE — Telephone Encounter (Signed)
Found clothing and medicine in personal belonging bag in [facility] closet and noted that patient had already discharged home.  Called [facility] [employer] and they are going to place belongings/medicine in Lost and Found and I called and left brief message on patient's voice mail at phone listed; [phone].

## 2023-09-14 ENCOUNTER — Other Ambulatory Visit
Admission: RE | Admit: 2023-09-14 | Discharge: 2023-09-14 | Disposition: A | Discharge status: Home / Self Care | Payer: Medicare HMO | Source: Ambulatory Visit | Attending: Nurse Practitioner | Admitting: Nurse Practitioner

## 2023-09-14 DIAGNOSIS — I38 Endocarditis, valve unspecified: Secondary | ICD-10-CM | POA: Diagnosis present

## 2023-09-14 DIAGNOSIS — I5022 Chronic systolic (congestive) heart failure: Secondary | ICD-10-CM | POA: Insufficient documentation

## 2023-09-14 DIAGNOSIS — R600 Localized edema: Secondary | ICD-10-CM | POA: Insufficient documentation

## 2023-09-14 LAB — BRAIN NATRIURETIC PEPTIDE: B Natriuretic Peptide: 498.7 pg/mL — ABNORMAL HIGH (ref 0.0–100.0)

## 2023-09-29 ENCOUNTER — Other Ambulatory Visit
Admission: RE | Admit: 2023-09-29 | Discharge: 2023-09-29 | Disposition: A | Discharge status: Home / Self Care | Payer: Medicare HMO | Source: Ambulatory Visit | Attending: Nurse Practitioner | Admitting: Nurse Practitioner

## 2023-09-29 DIAGNOSIS — I5022 Chronic systolic (congestive) heart failure: Secondary | ICD-10-CM | POA: Insufficient documentation

## 2023-09-29 LAB — BRAIN NATRIURETIC PEPTIDE: B Natriuretic Peptide: 350.4 pg/mL — ABNORMAL HIGH (ref 0.0–100.0)

## 2023-10-22 ENCOUNTER — Ambulatory Visit: Payer: Medicare HMO | Admitting: Cardiology

## 2023-11-11 ENCOUNTER — Emergency Department (HOSPITAL_COMMUNITY): Payer: HMO

## 2023-11-11 ENCOUNTER — Other Ambulatory Visit: Payer: Self-pay

## 2023-11-11 ENCOUNTER — Encounter (HOSPITAL_COMMUNITY): Payer: Self-pay

## 2023-11-11 ENCOUNTER — Emergency Department (HOSPITAL_COMMUNITY)
Admission: EM | Admit: 2023-11-11 | Discharge: 2023-11-11 | Disposition: A | Discharge status: Home / Self Care | Payer: HMO | Attending: Emergency Medicine | Admitting: Emergency Medicine

## 2023-11-11 DIAGNOSIS — R110 Nausea: Secondary | ICD-10-CM | POA: Diagnosis not present

## 2023-11-11 DIAGNOSIS — J101 Influenza due to other identified influenza virus with other respiratory manifestations: Secondary | ICD-10-CM | POA: Insufficient documentation

## 2023-11-11 DIAGNOSIS — D696 Thrombocytopenia, unspecified: Secondary | ICD-10-CM | POA: Insufficient documentation

## 2023-11-11 DIAGNOSIS — R0602 Shortness of breath: Secondary | ICD-10-CM | POA: Insufficient documentation

## 2023-11-11 DIAGNOSIS — Z79899 Other long term (current) drug therapy: Secondary | ICD-10-CM | POA: Diagnosis not present

## 2023-11-11 DIAGNOSIS — J111 Influenza due to unidentified influenza virus with other respiratory manifestations: Secondary | ICD-10-CM

## 2023-11-11 DIAGNOSIS — J90 Pleural effusion, not elsewhere classified: Secondary | ICD-10-CM | POA: Insufficient documentation

## 2023-11-11 DIAGNOSIS — I499 Cardiac arrhythmia, unspecified: Secondary | ICD-10-CM | POA: Diagnosis not present

## 2023-11-11 DIAGNOSIS — I4891 Unspecified atrial fibrillation: Secondary | ICD-10-CM | POA: Diagnosis not present

## 2023-11-11 DIAGNOSIS — R7989 Other specified abnormal findings of blood chemistry: Secondary | ICD-10-CM | POA: Insufficient documentation

## 2023-11-11 DIAGNOSIS — R000 Tachycardia, unspecified: Secondary | ICD-10-CM | POA: Diagnosis not present

## 2023-11-11 DIAGNOSIS — I959 Hypotension, unspecified: Secondary | ICD-10-CM | POA: Diagnosis not present

## 2023-11-11 LAB — CBC WITH DIFFERENTIAL/PLATELET
Abs Immature Granulocytes: 0.02 10*3/uL (ref 0.00–0.07)
Basophils Absolute: 0 10*3/uL (ref 0.0–0.1)
Basophils Relative: 0 %
Eosinophils Absolute: 0 10*3/uL (ref 0.0–0.5)
Eosinophils Relative: 0 %
HCT: 35.6 % — ABNORMAL LOW (ref 36.0–46.0)
Hemoglobin: 12.2 g/dL (ref 12.0–15.0)
Immature Granulocytes: 0 %
Lymphocytes Relative: 9 %
Lymphs Abs: 0.5 10*3/uL — ABNORMAL LOW (ref 0.7–4.0)
MCH: 32.6 pg (ref 26.0–34.0)
MCHC: 34.3 g/dL (ref 30.0–36.0)
MCV: 95.2 fL (ref 80.0–100.0)
Monocytes Absolute: 0.3 10*3/uL (ref 0.1–1.0)
Monocytes Relative: 5 %
Neutro Abs: 4.5 10*3/uL (ref 1.7–7.7)
Neutrophils Relative %: 86 %
Platelets: 119 10*3/uL — ABNORMAL LOW (ref 150–400)
RBC: 3.74 MIL/uL — ABNORMAL LOW (ref 3.87–5.11)
RDW: 13.6 % (ref 11.5–15.5)
WBC: 5.2 10*3/uL (ref 4.0–10.5)
nRBC: 0 % (ref 0.0–0.2)

## 2023-11-11 LAB — URINALYSIS, ROUTINE W REFLEX MICROSCOPIC
Bilirubin Urine: NEGATIVE
Glucose, UA: NEGATIVE mg/dL
Hgb urine dipstick: NEGATIVE
Ketones, ur: NEGATIVE mg/dL
Leukocytes,Ua: NEGATIVE
Nitrite: NEGATIVE
Protein, ur: NEGATIVE mg/dL
Specific Gravity, Urine: 1.008 (ref 1.005–1.030)
pH: 6 (ref 5.0–8.0)

## 2023-11-11 LAB — COMPREHENSIVE METABOLIC PANEL
ALT: 23 U/L (ref 0–44)
AST: 23 U/L (ref 15–41)
Albumin: 2.9 g/dL — ABNORMAL LOW (ref 3.5–5.0)
Alkaline Phosphatase: 58 U/L (ref 38–126)
Anion gap: 12 (ref 5–15)
BUN: 12 mg/dL (ref 8–23)
CO2: 21 mmol/L — ABNORMAL LOW (ref 22–32)
Calcium: 8.3 mg/dL — ABNORMAL LOW (ref 8.9–10.3)
Chloride: 104 mmol/L (ref 98–111)
Creatinine, Ser: 1.04 mg/dL — ABNORMAL HIGH (ref 0.44–1.00)
GFR, Estimated: 56 mL/min — ABNORMAL LOW (ref >60)
Glucose, Bld: 102 mg/dL — ABNORMAL HIGH (ref 70–99)
Potassium: 3.8 mmol/L (ref 3.5–5.1)
Sodium: 137 mmol/L (ref 135–145)
Total Bilirubin: 0.8 mg/dL (ref 0.0–1.2)
Total Protein: 5.8 g/dL — ABNORMAL LOW (ref 6.5–8.1)

## 2023-11-11 LAB — BRAIN NATRIURETIC PEPTIDE: B Natriuretic Peptide: 941.9 pg/mL — ABNORMAL HIGH (ref 0.0–100.0)

## 2023-11-11 LAB — RESP PANEL BY RT-PCR (RSV, FLU A&B, COVID)  RVPGX2
Influenza A by PCR: POSITIVE — AB
Influenza B by PCR: NEGATIVE
Resp Syncytial Virus by PCR: NEGATIVE
SARS Coronavirus 2 by RT PCR: NEGATIVE

## 2023-11-11 LAB — TROPONIN I (HIGH SENSITIVITY)
Troponin I (High Sensitivity): 19 ng/L — ABNORMAL HIGH (ref <18)
Troponin I (High Sensitivity): 16 ng/L (ref <18)

## 2023-11-11 LAB — MAGNESIUM: Magnesium: 1.7 mg/dL (ref 1.7–2.4)

## 2023-11-11 MED ORDER — OSELTAMIVIR PHOSPHATE 75 MG PO CAPS
75.0000 mg | ORAL_CAPSULE | Freq: Once | ORAL | Status: AC
  Administered 2023-11-11: 75 mg via ORAL
  Filled 2023-11-11: qty 1

## 2023-11-11 MED ORDER — OSELTAMIVIR PHOSPHATE 75 MG PO CAPS: 75.0000 mg | ORAL_CAPSULE | Freq: Two times a day (BID) | ORAL | 0 refills | Status: DC

## 2023-11-11 NOTE — Discharge Instructions (Addendum)
You were seen in the emergency department for shortness of breath body aches.  Your atrial fibrillation was rapid and you were given medication to slow your heart rate down.  You tested positive for influenza.  The main treatment is supportive, rest and drink plenty of fluids, Tylenol for pain.  We are prescribing you Tamiflu which may shorten and lessen your symptoms.  Please continue your regular medications and follow-up closely with your primary care doctor and your cardiology team in the next 1-2 weeks.  Return to ER if any worsening or concerning symptoms, chest pain, increased trouble breathing, fainting, persistent fast heart beating, or other concern.

## 2023-11-11 NOTE — ED Provider Notes (Signed)
Signed out to D/c to home when 2nd trop back, pt with flu.   Flu test is positive. Pt w chr afib, hx Watchmans.   No chest pain or sob. Currently hr 88. Breathing comfortably, sats 99%.   Repeat trop normal/not increasing.   Pt indicates feels ready for d/c, is calling family.  Pt currently appears stable for ED d/c.  Return precautions provided.    Cathren Laine, MD 11/11/23 [phone]

## 2023-11-11 NOTE — ED Provider Notes (Signed)
[facility] EMERGENCY DEPARTMENT AT [facility] Provider Note   CSN: [id number] Arrival date & time: 11/11/23  1103     History  Chief Complaint  Patient presents with   Atrial Fibrillation    Shortness of breath    Stephanie [name] is a 73 y.o. female.  She has a history of A-fib treated with Watchman and amiodarone.  Not on anticoagulation.  She said she has been in A-fib since December when she had an MI.  She is still to be getting an ablation with [facility] at some point.  For the last few days she has had cough body aches fatigue shortness of breath.  Called EMS this morning was found to be in a rapid A-fib of 1 30-1 60 and given 10 mg of Cardizem.  No chest pain no fever  The history is provided by the patient and the EMS personnel.  Shortness of Breath Severity:  Moderate Onset quality:  Gradual Duration:  3 days Timing:  Constant Progression:  Worsening Relieved by:  Nothing Worsened by:  Activity and coughing Ineffective treatments:  Rest Associated symptoms: cough   Associated symptoms: no abdominal pain, no chest pain, no fever, no hemoptysis, no sputum production and no vomiting        Home Medications Prior to Admission medications   Medication Sig Start Date End Date Taking? Authorizing Provider  amiodarone (PACERONE) 200 MG tablet Take 2 tablets (400 mg total) by mouth 2 (two) times daily for 7 days, THEN 1 tablet (200 mg total) daily. 08/24/23 09/30/23  Enedina Finner, MD  Ascorbic Acid (VITAMIN C PO) Take 1 tablet by mouth daily.    [provider]  calcium citrate-vitamin D (CITRACAL+D) 315-200 MG-UNIT per tablet Take 1 tablet by mouth daily.     [provider]  colestipol (COLESTID) 1 g tablet Take 1 g by mouth daily. 06/15/22   [provider]  fluorometholone (FML) 0.1 % ophthalmic suspension Place 1 drop into both eyes 4 (four) times daily. 07/15/23   [provider]  losartan (COZAAR) 25 MG tablet Take 1 tablet (25 mg  total) by mouth daily. 08/25/23   Enedina Finner, MD  metoprolol succinate (TOPROL-XL) 50 MG 24 hr tablet Take 1 tablet (50 mg total) by mouth daily. Take with or immediately following a meal. 08/25/23   Enedina Finner, MD  sertraline (ZOLOFT) 50 MG tablet Take 50 mg by mouth daily.    [provider]      Allergies    Codeine, Lactulose, Lactose intolerance (gi), and Phenazopyridine hcl    Review of Systems   Review of Systems  Constitutional:  Positive for fatigue. Negative for fever.  Respiratory:  Positive for cough and shortness of breath. Negative for hemoptysis and sputum production.   Cardiovascular:  Negative for chest pain.  Gastrointestinal:  Negative for abdominal pain and vomiting.  Musculoskeletal:  Positive for myalgias.  Neurological:  Negative for syncope.    Physical Exam Updated Vital Signs BP 113/79   Pulse 96   Temp 97.9 F (36.6 C) (Oral)   Resp (!) 21   Ht 5\' 5"  (1.651 m)   Wt 71.7 kg   SpO2 96%   BMI 26.29 kg/m  Physical Exam Vitals and nursing note reviewed.  Constitutional:      General: She is not in acute distress.    Appearance: Normal appearance. She is well-developed.  HENT:     Head: Normocephalic and atraumatic.  Eyes:  Conjunctiva/sclera: Conjunctivae normal.  Cardiovascular:     Rate and Rhythm: Tachycardia present. Rhythm irregular.     Heart sounds: No murmur heard. Pulmonary:     Effort: Pulmonary effort is normal. No respiratory distress.     Breath sounds: Rhonchi (few scattered) present.  Abdominal:     Palpations: Abdomen is soft.     Tenderness: There is no abdominal tenderness. There is no guarding or rebound.  Musculoskeletal:        General: No swelling.     Cervical back: Neck supple.  Skin:    General: Skin is warm and dry.     Capillary Refill: Capillary refill takes less than 2 seconds.  Neurological:     General: No focal deficit present.     Mental Status: She is alert.     Sensory: No sensory deficit.      ED Results / Procedures / Treatments   Labs (all labs ordered are listed, but only abnormal results are displayed) Labs Reviewed  RESP PANEL BY RT-PCR (RSV, FLU A&B, COVID)  RVPGX2 - Abnormal; Notable for the following components:      Result Value   Influenza A by PCR POSITIVE (*)    All other components within normal limits  COMPREHENSIVE METABOLIC PANEL - Abnormal; Notable for the following components:   CO2 21 (*)    Glucose, Bld 102 (*)    Creatinine, Ser 1.04 (*)    Calcium 8.3 (*)    Total Protein 5.8 (*)    Albumin 2.9 (*)    GFR, Estimated 56 (*)    All other components within normal limits  BRAIN NATRIURETIC PEPTIDE - Abnormal; Notable for the following components:   B Natriuretic Peptide 941.9 (*)    All other components within normal limits  CBC WITH DIFFERENTIAL/PLATELET - Abnormal; Notable for the following components:   RBC 3.74 (*)    HCT 35.6 (*)    Platelets 119 (*)    Lymphs Abs 0.5 (*)    All other components within normal limits  TROPONIN I (HIGH SENSITIVITY) - Abnormal; Notable for the following components:   Troponin I (High Sensitivity) 19 (*)    All other components within normal limits  MAGNESIUM  URINALYSIS, ROUTINE W REFLEX MICROSCOPIC  TROPONIN I (HIGH SENSITIVITY)    EKG EKG Interpretation Date/Time:  Thursday November 11 2023 11:40:32 EST Ventricular Rate:  79 PR Interval:    QRS Duration:  97 QT Interval:  404 QTC Calculation: 464 R Axis:   86  Text Interpretation: Atrial fibrillation Borderline right axis deviation Borderline repolarization abnormality axis back to baseline Confirmed by Meridee Score [phone]) on 11/11/2023 11:46:01 AM  Radiology DG Chest Port 1 View Result Date: 11/11/2023 CLINICAL DATA:  Shortness of breath and cough. EXAM: PORTABLE CHEST 1 VIEW COMPARISON:  Chest radiograph dated 08/22/2023 and CT dated 08/23/2023. FINDINGS: Mild central vascular congestion. Small left pleural effusion and left lung base  atelectasis or infiltrate. The right lung is clear. No pneumothorax. No acute osseous pathology. IMPRESSION: Small left pleural effusion and left lung base atelectasis or infiltrate. Electronically Signed   By: Elgie Collard M.D.   On: 11/11/2023 12:43    Procedures Procedures    Medications Ordered in ED Medications  oseltamivir (TAMIFLU) capsule 75 mg (75 mg Oral Given 11/11/23 1514)    ED Course/ Medical Decision Making/ A&P Clinical Course as of 11/11/23 1614  Thu Nov 11, 2023  1200 Chest x-ray with possible infiltrate left base.  Awaiting  radiology reading. [MB]    Clinical Course User Index [MB] Terrilee Files, MD                                 Medical Decision Making Amount and/or Complexity of Data Reviewed Labs: ordered. Radiology: ordered.  Risk Prescription drug management.   This patient complains of cough body aches shortness of breath atrial fibrillation; this involves an extensive number of treatment Options and is a complaint that carries with it a high risk of complications and morbidity. The differential includes A-fib, CHF, pneumonia, PE, flu, COVID  I ordered, reviewed and interpreted labs, which included CBC with newly low platelets, chemistries with mildly low bicarb, troponins mildly elevated but flat, BNP elevated, COVID negative flu positive, UA negative I ordered medication oral Tamiflu and reviewed PMP when indicated. I ordered imaging studies which included chest x-ray and I independently    visualized and interpreted imaging which showed atelectasis versus infiltrate left base Additional history obtained from EMS Previous records obtained and reviewed in epic including recent cardiology notes Cardiac monitoring reviewed, atrial fibrillation with controlled ventricular response Social determinants considered, financial insecurity Critical Interventions: None  After the interventions stated above, I reevaluated the patient and found  patient's heart rate to remain in the 80s and 90s in no respiratory distress Admission and further testing considered, her care is signed out to Dr. Denton Lank to follow-up on results of second troponin.  She currently looks pretty good and I feel she could probably be discharged on Tamiflu with close cardiology follow-up.         Final Clinical Impression(s) / ED Diagnoses Final diagnoses:  Atrial fibrillation with RVR (HCC)  Influenza    Rx / DC Orders ED Discharge Orders          Ordered    oseltamivir (TAMIFLU) 75 MG capsule  Every 12 hours        11/11/23 1519              Terrilee Files, MD 11/11/23 1616

## 2023-11-11 NOTE — ED Triage Notes (Signed)
PT BIB EMS for shortness of breath since last night, was in Afib with RVR at a rate of 130-160, history of Afib, multiple ablations in the past.    Cardizem 10 mg IV 22 right hand  500 NS bolus 118/60 98% RA 18 Resp

## 2023-11-16 ENCOUNTER — Other Ambulatory Visit: Payer: Self-pay

## 2023-11-16 ENCOUNTER — Emergency Department: Payer: HMO

## 2023-11-16 DIAGNOSIS — I10 Essential (primary) hypertension: Secondary | ICD-10-CM | POA: Diagnosis not present

## 2023-11-16 DIAGNOSIS — J181 Lobar pneumonia, unspecified organism: Secondary | ICD-10-CM | POA: Diagnosis not present

## 2023-11-16 DIAGNOSIS — Z7901 Long term (current) use of anticoagulants: Secondary | ICD-10-CM | POA: Diagnosis not present

## 2023-11-16 DIAGNOSIS — R059 Cough, unspecified: Secondary | ICD-10-CM | POA: Diagnosis present

## 2023-11-16 DIAGNOSIS — I4891 Unspecified atrial fibrillation: Secondary | ICD-10-CM | POA: Insufficient documentation

## 2023-11-16 DIAGNOSIS — R590 Localized enlarged lymph nodes: Secondary | ICD-10-CM | POA: Diagnosis not present

## 2023-11-16 DIAGNOSIS — J111 Influenza due to unidentified influenza virus with other respiratory manifestations: Secondary | ICD-10-CM | POA: Diagnosis not present

## 2023-11-16 DIAGNOSIS — J90 Pleural effusion, not elsewhere classified: Secondary | ICD-10-CM | POA: Diagnosis not present

## 2023-11-16 DIAGNOSIS — J189 Pneumonia, unspecified organism: Secondary | ICD-10-CM | POA: Diagnosis not present

## 2023-11-16 LAB — BASIC METABOLIC PANEL
Anion gap: 14 (ref 5–15)
BUN: 10 mg/dL (ref 8–23)
CO2: 18 mmol/L — ABNORMAL LOW (ref 22–32)
Calcium: 8.4 mg/dL — ABNORMAL LOW (ref 8.9–10.3)
Chloride: 105 mmol/L (ref 98–111)
Creatinine, Ser: UNDETERMINED mg/dL (ref 0.44–1.00)
Glucose, Bld: 108 mg/dL — ABNORMAL HIGH (ref 70–99)
Potassium: 3.3 mmol/L — ABNORMAL LOW (ref 3.5–5.1)
Sodium: 137 mmol/L (ref 135–145)

## 2023-11-16 LAB — TROPONIN I (HIGH SENSITIVITY): Troponin I (High Sensitivity): 11 ng/L (ref <18)

## 2023-11-16 NOTE — ED Triage Notes (Signed)
 Pt was dx with flu on Thursday, pt reports hx a fib. Pt states shortness of breath and heaviness in her chest for the past week. Pt taking medications as prescribed.

## 2023-11-17 ENCOUNTER — Other Ambulatory Visit: Payer: Self-pay

## 2023-11-17 ENCOUNTER — Emergency Department: Payer: HMO

## 2023-11-17 ENCOUNTER — Emergency Department
Admission: EM | Admit: 2023-11-17 | Discharge: 2023-11-17 | Disposition: A | Discharge status: Home / Self Care | Payer: HMO | Attending: Emergency Medicine | Admitting: Emergency Medicine

## 2023-11-17 DIAGNOSIS — J189 Pneumonia, unspecified organism: Secondary | ICD-10-CM | POA: Diagnosis not present

## 2023-11-17 DIAGNOSIS — R590 Localized enlarged lymph nodes: Secondary | ICD-10-CM | POA: Diagnosis not present

## 2023-11-17 DIAGNOSIS — J90 Pleural effusion, not elsewhere classified: Secondary | ICD-10-CM | POA: Diagnosis not present

## 2023-11-17 DIAGNOSIS — R0602 Shortness of breath: Secondary | ICD-10-CM

## 2023-11-17 DIAGNOSIS — J111 Influenza due to unidentified influenza virus with other respiratory manifestations: Secondary | ICD-10-CM | POA: Diagnosis not present

## 2023-11-17 LAB — CBC
HCT: 36.9 % (ref 36.0–46.0)
Hemoglobin: 12.9 g/dL (ref 12.0–15.0)
MCH: 32.3 pg (ref 26.0–34.0)
MCHC: 35 g/dL (ref 30.0–36.0)
MCV: 92.3 fL (ref 80.0–100.0)
Platelets: 189 10*3/uL (ref 150–400)
RBC: 4 MIL/uL (ref 3.87–5.11)
RDW: 13.2 % (ref 11.5–15.5)
WBC: 6.7 10*3/uL (ref 4.0–10.5)
nRBC: 0 % (ref 0.0–0.2)

## 2023-11-17 LAB — CREATININE, SERUM
Creatinine, Ser: 1.04 mg/dL — ABNORMAL HIGH (ref 0.44–1.00)
GFR, Estimated: 56 mL/min — ABNORMAL LOW (ref >60)

## 2023-11-17 LAB — TROPONIN I (HIGH SENSITIVITY): Troponin I (High Sensitivity): 13 ng/L (ref <18)

## 2023-11-17 MED ORDER — SODIUM CHLORIDE 0.9 % IV BOLUS
1000.0000 mL | Freq: Once | INTRAVENOUS | Status: AC
  Administered 2023-11-17: 1000 mL via INTRAVENOUS

## 2023-11-17 MED ORDER — AEROCHAMBER MV MISC: 0 refills | Status: AC

## 2023-11-17 MED ORDER — AEROCHAMBER MV MISC: 0 refills | Status: DC

## 2023-11-17 MED ORDER — ALBUTEROL SULFATE HFA 108 (90 BASE) MCG/ACT IN AERS: 2.0000 | INHALATION_SPRAY | Freq: Four times a day (QID) | RESPIRATORY_TRACT | 2 refills | Status: AC | PRN

## 2023-11-17 MED ORDER — IOHEXOL 350 MG/ML SOLN
75.0000 mL | Freq: Once | INTRAVENOUS | Status: AC | PRN
  Administered 2023-11-17: 75 mL via INTRAVENOUS

## 2023-11-17 MED ORDER — PREDNISONE 20 MG PO TABS
60.0000 mg | ORAL_TABLET | Freq: Once | ORAL | Status: AC
  Administered 2023-11-17: 60 mg via ORAL
  Filled 2023-11-17: qty 3

## 2023-11-17 MED ORDER — DOXYCYCLINE HYCLATE 50 MG PO CAPS: 100.0000 mg | ORAL_CAPSULE | Freq: Two times a day (BID) | ORAL | 0 refills | Status: AC

## 2023-11-17 MED ORDER — DOXYCYCLINE HYCLATE 50 MG PO CAPS
100.0000 mg | ORAL_CAPSULE | Freq: Two times a day (BID) | ORAL | 0 refills | Status: DC
Start: 2023-11-17 — End: 2023-11-17

## 2023-11-17 MED ORDER — ALBUTEROL SULFATE HFA 108 (90 BASE) MCG/ACT IN AERS: 2.0000 | INHALATION_SPRAY | Freq: Four times a day (QID) | RESPIRATORY_TRACT | 2 refills | Status: DC | PRN

## 2023-11-17 MED ORDER — PREDNISONE 50 MG PO TABS: 50.0000 mg | ORAL_TABLET | Freq: Every day | ORAL | 0 refills | Status: AC

## 2023-11-17 MED ORDER — DOXYCYCLINE HYCLATE 100 MG PO TABS
100.0000 mg | ORAL_TABLET | Freq: Once | ORAL | Status: AC
  Administered 2023-11-17: 100 mg via ORAL
  Filled 2023-11-17: qty 1

## 2023-11-17 MED ORDER — IPRATROPIUM-ALBUTEROL 0.5-2.5 (3) MG/3ML IN SOLN
6.0000 mL | Freq: Once | RESPIRATORY_TRACT | Status: AC
  Administered 2023-11-17: 6 mL via RESPIRATORY_TRACT
  Filled 2023-11-17: qty 3

## 2023-11-17 MED ORDER — PREDNISONE 50 MG PO TABS
50.0000 mg | ORAL_TABLET | Freq: Every day | ORAL | 0 refills | Status: DC
Start: 2023-11-17 — End: 2023-11-17

## 2023-11-17 NOTE — Discharge Instructions (Addendum)
 Take antibiotics, prednisone, and inhaler as prescribed.  Call your doctor for a follow-up visit.  Drink plenty of fluids to stay well-hydrated.  Thank you for choosing [facility] for your health care today!  Please see your primary doctor this week for a follow up appointment.   If you have any new, worsening, or unexpected symptoms call your doctor right away or come back to the emergency department for reevaluation.  It was my pleasure to care for you today.   Daneil Dan Modesto Charon, MD

## 2023-11-17 NOTE — ED Provider Notes (Addendum)
 [facility] Provider Note    Event Date/Time   First MD Initiated Contact with Patient 11/17/23 [phone]     (approximate)   History   Shortness of Breath   HPI  Stephanie [name] is a 73 y.o. female   Past medical history of atrial fibrillation on anticoagulation on a watchman's, hypertension hyperlipidemia presents to Emergency Department with shortness of breath that was worsening over the last 1 day in the setting of known influenza that has failed her over the last 1 week.  Typical influenza symptoms include myalgias, fever/chills, cough and generalized weakness.  Over last 1 day she has felt more short of breath.  She denies chest pain.  No other acute medical complaints.  External Medical Documents Reviewed: Emergency department notes from 11/11/2023 when she was diagnosed with influenza      Physical Exam   Triage Vital Signs: ED Triage Vitals  Encounter Vitals Group     BP 11/16/23 2109 130/77     Systolic BP Percentile --      Diastolic BP Percentile --      Pulse Rate 11/16/23 2109 (!) 113     Resp 11/16/23 2109 20     Temp 11/16/23 2110 97.8 F (36.6 C)     Temp Source 11/17/23 0110 Oral     SpO2 11/16/23 2109 100 %     Weight --      Height --      Head Circumference --      Peak Flow --      Pain Score 11/17/23 0227 2     Pain Loc --      Pain Education --      Exclude from Growth Chart --     Most recent vital signs: Vitals:   11/17/23 0110 11/17/23 0227  BP: (!) 139/97 (!) 145/90  Pulse: (!) 104 98  Resp: 20 20  Temp: 98.6 F (37 C) (!) 97.5 F (36.4 C)  SpO2: 98% 98%    General: Awake, no distress.  CV:  Good peripheral perfusion.  Resp:  Normal effort.  Abd:  No distention.  Other:  Tachypneic.  Otherwise vital signs are normal no hypoxemia.  She has coarse lung sounds bilaterally and some scant wheezing throughout.  No focality.   ED Results / Procedures / Treatments   Labs (all labs ordered are listed, but  only abnormal results are displayed) Labs Reviewed  BASIC METABOLIC PANEL - Abnormal; Notable for the following components:      Result Value   Potassium 3.3 (*)    CO2 18 (*)    Glucose, Bld 108 (*)    Calcium 8.4 (*)    All other components within normal limits  CREATININE, SERUM - Abnormal; Notable for the following components:   Creatinine, Ser 1.04 (*)    GFR, Estimated 56 (*)    All other components within normal limits  CBC  TROPONIN I (HIGH SENSITIVITY)  TROPONIN I (HIGH SENSITIVITY)     I ordered and reviewed the above labs they are notable for white blood cell count is within normal limits.  Troponins have been normal and flat x 2. EKG  ED ECG REPORT I, Pilar Jarvis, the attending physician, personally viewed and interpreted this ECG.   Date: 11/17/2023  EKG Time: 0233  Rate: 96  Rhythm: AF  Axis: nl  Intervals:none  ST&T Change: no stemi    RADIOLOGY I independently reviewed and interpreted chest x-ray and I see  a right lower lobe opacity concerning for pneumonia I also reviewed radiologist's formal read.   PROCEDURES:  Critical Care performed: No  Procedures   MEDICATIONS ORDERED IN ED: Medications  ipratropium-albuterol (DUONEB) 0.5-2.5 (3) MG/3ML nebulizer solution 6 mL (6 mLs Nebulization Given 11/17/23 0323)  predniSONE (DELTASONE) tablet 60 mg (60 mg Oral Given 11/17/23 0323)  sodium chloride 0.9 % bolus 1,000 mL (1,000 mLs Intravenous New Bag/Given 11/17/23 0340)  doxycycline (VIBRA-TABS) tablet 100 mg (100 mg Oral Given 11/17/23 0339)  iohexol (OMNIPAQUE) 350 MG/ML injection 75 mL (75 mLs Intravenous Contrast Given 11/17/23 0351)     IMPRESSION / MDM / ASSESSMENT AND PLAN / ED COURSE  I reviewed the triage vital signs and the nursing notes.                                Patient's presentation is most consistent with acute presentation with potential threat to life or bodily function.  Differential diagnosis includes, but is not limited to,  bacterial pneumonia, PE, ACS   The patient is on the cardiac monitor to evaluate for evidence of arrhythmia and/or significant heart rate changes.  MDM:    Her symptoms may be her ongoing symptoms related to her known influenza infection.  However given her worsening shortness of breath, would be prudent to check for superimposed bacterial pneumonia with chest x-ray, labs, also consider PE given her high risk with for inflammatory state with influenza as well as her own anticoagulated state with atrial fibrillation we will proceed with CT angiogram of the chest.  Patient is a smoker and has some wheezing in her lungs throughout.  Will give a DuoNeb and prednisone as well for asthma/COPD exacerbation.   Her chest x-ray shows some signs of a opacity in the right lower lobe concerning for pneumonia.  Will give CAP coverage.  Still need a CT angiogram to rule out PE. -- If CT angiogram is negative plan will be for discharge home given her stable state in the emergency department and add on doxycycline for antibiotic coverage for community-acquired pneumonia in addition to her influenza.  Will also give burst of prednisone and albuterol inhaler to treat mild COPD exacerbation suspected.   -- CT angiogram negative for PE.  Shows multifocal pneumonia.  I reassessed the patient she remains hemodynamically stable with a heart rate in the 90s and atrial fibrillation irregular and respiratory rate 20.  Oxygenation is 99% on room air and breathing comfortably.  I gave her the option of staying in the hospital given her generalized weakness/fatigue multifocal pneumonia at 73 years old however she would prefer to go home and follow-up with her PMD, with the understanding that should she worsen anyway to come back to the emergency department for reevaluation.     FINAL CLINICAL IMPRESSION(S) / ED DIAGNOSES   Final diagnoses:  SOB (shortness of breath)  Community acquired pneumonia of right lower lobe of  lung     Rx / DC Orders   ED Discharge Orders          Ordered    doxycycline (VIBRAMYCIN) 50 MG capsule  2 times daily        11/17/23 0336    predniSONE (DELTASONE) 50 MG tablet  Daily        11/17/23 0336    albuterol (VENTOLIN HFA) 108 (90 Base) MCG/ACT inhaler  Every 6 hours PRN  11/17/23 0336    Spacer/Aero-Holding Chambers (AEROCHAMBER MV) inhaler        11/17/23 0336             Note:  This document was prepared using Dragon voice recognition software and may include unintentional dictation errors.    Pilar Jarvis, MD 11/17/23 1914    Pilar Jarvis, MD 11/17/23 [phone]

## 2023-11-19 DIAGNOSIS — I480 Paroxysmal atrial fibrillation: Secondary | ICD-10-CM | POA: Diagnosis not present

## 2023-11-25 DIAGNOSIS — G4733 Obstructive sleep apnea (adult) (pediatric): Secondary | ICD-10-CM | POA: Diagnosis not present

## 2023-11-25 DIAGNOSIS — J449 Chronic obstructive pulmonary disease, unspecified: Secondary | ICD-10-CM | POA: Diagnosis not present

## 2023-11-25 DIAGNOSIS — Z9289 Personal history of other medical treatment: Secondary | ICD-10-CM | POA: Diagnosis not present

## 2023-11-25 DIAGNOSIS — Z87891 Personal history of nicotine dependence: Secondary | ICD-10-CM | POA: Diagnosis not present

## 2023-11-25 DIAGNOSIS — Z8709 Personal history of other diseases of the respiratory system: Secondary | ICD-10-CM | POA: Diagnosis not present

## 2023-11-25 DIAGNOSIS — Z8701 Personal history of pneumonia (recurrent): Secondary | ICD-10-CM | POA: Diagnosis not present

## 2023-11-25 DIAGNOSIS — Z7185 Encounter for immunization safety counseling: Secondary | ICD-10-CM | POA: Diagnosis not present

## 2023-11-27 ENCOUNTER — Inpatient Hospital Stay
Admission: EM | Admit: 2023-11-27 | Discharge: 2023-11-30 | DRG: 291 | Disposition: A | Discharge status: Home / Self Care | Attending: Osteopathic Medicine | Admitting: Osteopathic Medicine

## 2023-11-27 ENCOUNTER — Emergency Department

## 2023-11-27 ENCOUNTER — Other Ambulatory Visit: Payer: Self-pay

## 2023-11-27 DIAGNOSIS — Z87891 Personal history of nicotine dependence: Secondary | ICD-10-CM

## 2023-11-27 DIAGNOSIS — R918 Other nonspecific abnormal finding of lung field: Secondary | ICD-10-CM | POA: Diagnosis not present

## 2023-11-27 DIAGNOSIS — T502X5A Adverse effect of carbonic-anhydrase inhibitors, benzothiadiazides and other diuretics, initial encounter: Secondary | ICD-10-CM | POA: Diagnosis not present

## 2023-11-27 DIAGNOSIS — Z79899 Other long term (current) drug therapy: Secondary | ICD-10-CM | POA: Diagnosis not present

## 2023-11-27 DIAGNOSIS — I5042 Chronic combined systolic (congestive) and diastolic (congestive) heart failure: Secondary | ICD-10-CM | POA: Diagnosis present

## 2023-11-27 DIAGNOSIS — Z1152 Encounter for screening for COVID-19: Secondary | ICD-10-CM | POA: Diagnosis not present

## 2023-11-27 DIAGNOSIS — R0789 Other chest pain: Secondary | ICD-10-CM | POA: Diagnosis not present

## 2023-11-27 DIAGNOSIS — I700 Atherosclerosis of aorta: Secondary | ICD-10-CM | POA: Diagnosis not present

## 2023-11-27 DIAGNOSIS — F419 Anxiety disorder, unspecified: Secondary | ICD-10-CM | POA: Diagnosis not present

## 2023-11-27 DIAGNOSIS — E785 Hyperlipidemia, unspecified: Secondary | ICD-10-CM | POA: Diagnosis present

## 2023-11-27 DIAGNOSIS — I10 Essential (primary) hypertension: Secondary | ICD-10-CM | POA: Diagnosis not present

## 2023-11-27 DIAGNOSIS — I4891 Unspecified atrial fibrillation: Secondary | ICD-10-CM | POA: Diagnosis present

## 2023-11-27 DIAGNOSIS — I5043 Acute on chronic combined systolic (congestive) and diastolic (congestive) heart failure: Secondary | ICD-10-CM | POA: Diagnosis not present

## 2023-11-27 DIAGNOSIS — Z6826 Body mass index (BMI) 26.0-26.9, adult: Secondary | ICD-10-CM

## 2023-11-27 DIAGNOSIS — R079 Chest pain, unspecified: Secondary | ICD-10-CM | POA: Diagnosis not present

## 2023-11-27 DIAGNOSIS — Z95818 Presence of other cardiac implants and grafts: Secondary | ICD-10-CM

## 2023-11-27 DIAGNOSIS — N179 Acute kidney failure, unspecified: Secondary | ICD-10-CM | POA: Diagnosis not present

## 2023-11-27 DIAGNOSIS — I110 Hypertensive heart disease with heart failure: Secondary | ICD-10-CM | POA: Diagnosis not present

## 2023-11-27 DIAGNOSIS — Z9049 Acquired absence of other specified parts of digestive tract: Secondary | ICD-10-CM | POA: Diagnosis not present

## 2023-11-27 DIAGNOSIS — I509 Heart failure, unspecified: Secondary | ICD-10-CM

## 2023-11-27 DIAGNOSIS — J9811 Atelectasis: Secondary | ICD-10-CM | POA: Diagnosis not present

## 2023-11-27 DIAGNOSIS — R071 Chest pain on breathing: Principal | ICD-10-CM

## 2023-11-27 DIAGNOSIS — J90 Pleural effusion, not elsewhere classified: Secondary | ICD-10-CM | POA: Diagnosis not present

## 2023-11-27 DIAGNOSIS — J984 Other disorders of lung: Secondary | ICD-10-CM | POA: Diagnosis not present

## 2023-11-27 DIAGNOSIS — F32A Depression, unspecified: Secondary | ICD-10-CM | POA: Diagnosis not present

## 2023-11-27 DIAGNOSIS — E663 Overweight: Secondary | ICD-10-CM | POA: Diagnosis present

## 2023-11-27 DIAGNOSIS — G2581 Restless legs syndrome: Secondary | ICD-10-CM | POA: Diagnosis not present

## 2023-11-27 HISTORY — DX: Chronic combined systolic (congestive) and diastolic (congestive) heart failure: I50.42

## 2023-11-27 LAB — TROPONIN I (HIGH SENSITIVITY)
Troponin I (High Sensitivity): 15 ng/L (ref <18)
Troponin I (High Sensitivity): 14 ng/L (ref <18)

## 2023-11-27 LAB — BASIC METABOLIC PANEL
Anion gap: 5 (ref 5–15)
BUN: 20 mg/dL (ref 8–23)
CO2: 23 mmol/L (ref 22–32)
Calcium: 8.4 mg/dL — ABNORMAL LOW (ref 8.9–10.3)
Chloride: 109 mmol/L (ref 98–111)
Creatinine, Ser: 0.95 mg/dL (ref 0.44–1.00)
GFR, Estimated: >60 mL/min (ref >60)
Glucose, Bld: 115 mg/dL — ABNORMAL HIGH (ref 70–99)
Potassium: 3.9 mmol/L (ref 3.5–5.1)
Sodium: 137 mmol/L (ref 135–145)

## 2023-11-27 LAB — CBC
HCT: 39.9 % (ref 36.0–46.0)
Hemoglobin: 13.3 g/dL (ref 12.0–15.0)
MCH: 31.9 pg (ref 26.0–34.0)
MCHC: 33.3 g/dL (ref 30.0–36.0)
MCV: 95.7 fL (ref 80.0–100.0)
Platelets: 218 10*3/uL (ref 150–400)
RBC: 4.17 MIL/uL (ref 3.87–5.11)
RDW: 14.1 % (ref 11.5–15.5)
WBC: 8 10*3/uL (ref 4.0–10.5)
nRBC: 0 % (ref 0.0–0.2)

## 2023-11-27 LAB — RESP PANEL BY RT-PCR (RSV, FLU A&B, COVID)  RVPGX2
Influenza A by PCR: NEGATIVE
Influenza B by PCR: NEGATIVE
Resp Syncytial Virus by PCR: NEGATIVE
SARS Coronavirus 2 by RT PCR: NEGATIVE

## 2023-11-27 LAB — BRAIN NATRIURETIC PEPTIDE: B Natriuretic Peptide: 1072.2 pg/mL — ABNORMAL HIGH (ref 0.0–100.0)

## 2023-11-27 MED ORDER — DM-GUAIFENESIN ER 30-600 MG PO TB12: 1.0000 | ORAL_TABLET | Freq: Two times a day (BID) | ORAL | Status: DC | PRN

## 2023-11-27 MED ORDER — METOPROLOL SUCCINATE ER 50 MG PO TB24
50.0000 mg | ORAL_TABLET | Freq: Every day | ORAL | Status: DC
  Administered 2023-11-28: 50 mg via ORAL
  Filled 2023-11-27: qty 1

## 2023-11-27 MED ORDER — ONDANSETRON HCL 4 MG/2ML IJ SOLN
4.0000 mg | Freq: Three times a day (TID) | INTRAMUSCULAR | Status: DC | PRN
Start: 2023-11-27 — End: 2023-11-30
  Administered 2023-11-28: 4 mg via INTRAVENOUS
  Filled 2023-11-27: qty 2

## 2023-11-27 MED ORDER — ACETAMINOPHEN 325 MG PO TABS: 650.0000 mg | ORAL_TABLET | Freq: Four times a day (QID) | ORAL | Status: DC | PRN

## 2023-11-27 MED ORDER — FUROSEMIDE 10 MG/ML IJ SOLN
20.0000 mg | Freq: Once | INTRAMUSCULAR | Status: AC
  Administered 2023-11-27: 20 mg via INTRAVENOUS
  Filled 2023-11-27: qty 4

## 2023-11-27 MED ORDER — SERTRALINE HCL 50 MG PO TABS
50.0000 mg | ORAL_TABLET | Freq: Every day | ORAL | Status: DC
  Administered 2023-11-28 – 2023-11-30 (×3): 50 mg via ORAL
  Filled 2023-11-27 (×3): qty 1

## 2023-11-27 MED ORDER — FUROSEMIDE 10 MG/ML IJ SOLN
40.0000 mg | Freq: Two times a day (BID) | INTRAMUSCULAR | Status: DC
  Administered 2023-11-28: 40 mg via INTRAVENOUS
  Filled 2023-11-27: qty 4

## 2023-11-27 MED ORDER — OYSTER SHELL CALCIUM/D3 500-5 MG-MCG PO TABS
1.0000 | ORAL_TABLET | Freq: Every day | ORAL | Status: DC
  Administered 2023-11-28 – 2023-11-30 (×3): 1 via ORAL
  Filled 2023-11-27 (×3): qty 1

## 2023-11-27 MED ORDER — COLESTIPOL HCL 1 G PO TABS
1.0000 g | ORAL_TABLET | Freq: Every day | ORAL | Status: DC
  Administered 2023-11-28 – 2023-11-30 (×3): 1 g via ORAL
  Filled 2023-11-27 (×4): qty 1

## 2023-11-27 MED ORDER — METOPROLOL TARTRATE 5 MG/5ML IV SOLN: 5.0000 mg | INTRAVENOUS | Status: DC | PRN

## 2023-11-27 MED ORDER — HYDRALAZINE HCL 20 MG/ML IJ SOLN: 5.0000 mg | INTRAMUSCULAR | Status: DC | PRN

## 2023-11-27 MED ORDER — ENOXAPARIN SODIUM 40 MG/0.4ML IJ SOSY
40.0000 mg | PREFILLED_SYRINGE | INTRAMUSCULAR | Status: DC
  Administered 2023-11-27 – 2023-11-29 (×3): 40 mg via SUBCUTANEOUS
  Filled 2023-11-27 (×3): qty 0.4

## 2023-11-27 MED ORDER — ALBUTEROL SULFATE (2.5 MG/3ML) 0.083% IN NEBU: 3.0000 mL | INHALATION_SOLUTION | RESPIRATORY_TRACT | Status: DC | PRN

## 2023-11-27 MED ORDER — IOHEXOL 350 MG/ML SOLN
75.0000 mL | Freq: Once | INTRAVENOUS | Status: AC | PRN
  Administered 2023-11-27: 75 mL via INTRAVENOUS

## 2023-11-27 MED ORDER — METOPROLOL TARTRATE 5 MG/5ML IV SOLN
5.0000 mg | Freq: Once | INTRAVENOUS | Status: AC
  Administered 2023-11-27: 5 mg via INTRAVENOUS
  Filled 2023-11-27: qty 5

## 2023-11-27 MED ORDER — HEPARIN SODIUM (PORCINE) 5000 UNIT/ML IJ SOLN: 5000.0000 [IU] | Freq: Three times a day (TID) | INTRAMUSCULAR | Status: DC

## 2023-11-27 MED ORDER — VITAMIN C 500 MG PO TABS
500.0000 mg | ORAL_TABLET | Freq: Every day | ORAL | Status: DC
  Administered 2023-11-28 – 2023-11-30 (×3): 500 mg via ORAL
  Filled 2023-11-27 (×3): qty 1

## 2023-11-27 NOTE — ED Notes (Signed)
 First nurse note: From [facility] for cardiac workup, complained of dizziness and weakness. Hx afib, current HR 120-130. Hx MI on December. 116/85, 97.6.

## 2023-11-27 NOTE — ED Notes (Addendum)
 Pt stuck multiple times for IV in order to do CT angio with no success. Pt does have a 22 in the right AC that CT states they can't use.

## 2023-11-27 NOTE — ED Notes (Signed)
 Meal Tray and Ginger ale provided at patient request.

## 2023-11-27 NOTE — ED Triage Notes (Signed)
 Pt states she woke up at 4:30am and was unable to catch her breath. Used her inhaler. But still felt heaviness on her chest. Went to [facility] walk-in clinic and was sent to ED. Pt still complaining of not being able to get a deep breath. Denies pain, but states her chest feel heavy.

## 2023-11-27 NOTE — H&P (Signed)
 History and Physical    [name] ZOX:[id number] DOB: 1951-02-01 DOA: 11/27/2023  Referring MD/NP/PA:   PCP: Patient, No Pcp Per   Patient coming from:  The patient is coming from home.     Chief Complaint: SOB and palpitation  HPI: Stephanie [name] is a 73 y.o. female with medical history significant of CHF with EF 40-45%, HTN, HLD, depression, A-fib (s/p of Watchman device placement) not on anticoagulants, restless leg syndrome, who presents with SOB and palpitation.  Patient states that her shortness of breath started in this early morning, which has been progressively worsening.  Patient does not have cough or chest pain. She reports chest heaviness.  Patient also has palpitation.  No nausea, vomiting, diarrhea or abdominal pain.  No symptoms of UTI. Patient was seen in [place] [facility] today, found to have A-fib with RVR, heart rate up to 130s.  She was sent to ED for further evaluation and treatment.  Patient states that her cardiologist, Dr. Gerre Pebbles is planning to do A-fib ablation on 01/19/2024.  Per ED physician, her initial heart rate was up to 140s, which improved to 103 after giving 5 mg of IV metoprolol in ED.  Data reviewed independently and ED Course: pt was found to have BNP 1072, WBC 8.0, negative PCR for COVID, flu and RSV, troponin 15 --> 14, GFR> 60.  Chest x-ray showed bilateral basilar patchy infiltration.  CTA negative for PE.  Patient is admitted to PCU as inpatient.  CTA: 1. No pulmonary embolus. 2. Small to moderate bilateral pleural effusions, increased from prior CT. Associated compressive atelectasis. 3. Improvement but persistent patchy ground-glass opacities primarily in the right lung, likely resolving infection. 4. Mild cardiomegaly. Contrast refluxes into the hepatic veins and IVC suggesting right heart dysfunction. 5. Mildly enlarged right hilar lymph node, likely reactive.   Aortic Atherosclerosis (ICD10-I70.0).    EKG: I have personally  reviewed.  A-fib, QTc 451, heart rate 92, nonspecific T wave change.   Review of Systems:   General: no fevers, chills, no body weight gain, has fatigue HEENT: no blurry vision, hearing changes or sore throat Respiratory: has dyspnea, no coughing, wheezing CV: has chest heaviness, palpitations GI: no nausea, vomiting, abdominal pain, diarrhea, constipation GU: no dysuria, burning on urination, increased urinary frequency, hematuria  Ext: has leg edema Neuro: no unilateral weakness, numbness, or tingling, no vision change or hearing loss Skin: no rash, no skin tear. MSK: No muscle spasm, no deformity, no limitation of range of movement in spin Heme: No easy bruising.  Travel history: No recent long distant travel.   Allergy:  Allergies  Allergen Reactions   Codeine Other (See Comments)    Cannot tolerate alone, only when mixed with another medication. Makes patient feel spaced out. Patient has use codeine combined with other medications.   Lactulose Diarrhea   Lactose Intolerance (Gi)    Phenazopyridine Hcl Hives    Past Medical History:  Diagnosis Date   A-fib (HCC)    Arthritis    Chronic diarrhea    Diverticulosis    Dysrhythmia    ATRIAL FIB   Hyperlipidemia    Hypertension    Restless leg     Past Surgical History:  Procedure Laterality Date   CARDIOVERSION N/A 05/12/2018   Procedure: CARDIOVERSION;  Surgeon: Lamar Blinks, MD;  Location: [facility];  Service: Cardiovascular;  Laterality: N/A;   CARDIOVERSION N/A 06/19/2021   Procedure: CARDIOVERSION;  Surgeon: Alwyn Pea, MD;  Location: [facility];  Service:  Cardiovascular;  Laterality: N/A;   CHOLECYSTECTOMY  2004   COLONOSCOPY WITH PROPOFOL N/A 05/24/2019   Procedure: COLONOSCOPY WITH PROPOFOL;  Surgeon: Toledo, Boykin Nearing, MD;  Location: [facility];  Service: Gastroenterology;  Laterality: N/A;   ESOPHAGOGASTRODUODENOSCOPY (EGD) WITH PROPOFOL N/A 05/24/2019   Procedure: ESOPHAGOGASTRODUODENOSCOPY  (EGD) WITH PROPOFOL;  Surgeon: Toledo, Boykin Nearing, MD;  Location: [facility];  Service: Gastroenterology;  Laterality: N/A;   KNEE SURGERY     KNEE SURGERY     LEFT HEART CATH AND CORONARY ANGIOGRAPHY N/A 08/23/2023   Procedure: LEFT HEART CATH AND CORONARY ANGIOGRAPHY;  Surgeon: Alwyn Pea, MD;  Location: [facility];  Service: Cardiovascular;  Laterality: N/A;   TEE WITHOUT CARDIOVERSION N/A 05/12/2018   Procedure: TRANSESOPHAGEAL ECHOCARDIOGRAM (TEE);  Surgeon: Lamar Blinks, MD;  Location: [facility];  Service: Cardiovascular;  Laterality: N/A;   TOE SURGERY      Social History:  reports that she has quit smoking. Her smoking use included cigarettes. She has a 40 pack-year smoking history. She has never used smokeless tobacco. She reports that she does not currently use alcohol. She reports that she does not use drugs.  Family History:  Family History  Problem Relation Age of Onset   Breast cancer Neg Hx      Prior to Admission medications   Medication Sig Start Date End Date Taking? Authorizing Provider  albuterol (VENTOLIN HFA) 108 (90 Base) MCG/ACT inhaler Inhale 2 puffs into the lungs every 6 (six) hours as needed for wheezing or shortness of breath. 11/17/23   Pilar Jarvis, MD  amiodarone (PACERONE) 200 MG tablet Take 2 tablets (400 mg total) by mouth 2 (two) times daily for 7 days, THEN 1 tablet (200 mg total) daily. Patient not taking: Reported on 11/11/2023 08/24/23 09/30/23  Enedina Finner, MD  Ascorbic Acid (VITAMIN C PO) Take 1 tablet by mouth daily.    [provider]  calcium citrate-vitamin D (CITRACAL+D) 315-200 MG-UNIT per tablet Take 1 tablet by mouth daily.     [provider]  colestipol (COLESTID) 1 g tablet Take 1 g by mouth daily. 06/15/22   [provider]  fluorometholone (FML) 0.1 % ophthalmic suspension Place 1 drop into both eyes 4 (four) times daily. 07/15/23   [provider]  losartan (COZAAR) 25 MG tablet Take  1 tablet (25 mg total) by mouth daily. Patient not taking: Reported on 11/11/2023 08/25/23   Enedina Finner, MD  metoprolol succinate (TOPROL-XL) 50 MG 24 hr tablet Take 1 tablet (50 mg total) by mouth daily. Take with or immediately following a meal. Patient taking differently: Take 50 mg by mouth in the morning and at bedtime. Take with or immediately following a meal. 08/25/23   Enedina Finner, MD  oseltamivir (TAMIFLU) 75 MG capsule Take 1 capsule (75 mg total) by mouth every 12 (twelve) hours. 11/11/23   Terrilee Files, MD  sertraline (ZOLOFT) 50 MG tablet Take 50 mg by mouth daily.    [provider]  Spacer/Aero-Holding Chambers (AEROCHAMBER MV) inhaler Use as instructed 11/17/23   Pilar Jarvis, MD  VEVYE 0.1 % SOLN Place 1 drop into both eyes 2 (two) times daily. 11/09/23   [provider]    Physical Exam: Vitals:   11/27/23 1844 11/27/23 1900 11/27/23 1930 11/27/23 1956  BP:  (!) 124/102 (!) 150/90   Pulse:  (!) 118 88   Resp:  (!) 27 (!) 36 20  Temp: 97.7 F (36.5 C)   97.8  F (36.6 C)  TempSrc: Oral   Oral  SpO2:  98% 100%   Weight:      Height:       General: Not in acute distress HEENT:       Eyes: PERRL, EOMI, no jaundice       ENT: No discharge from the ears and nose, no pharynx injection, no tonsillar enlargement.        Neck: Positive JVD, no bruit, no mass felt. Heme: No neck lymph node enlargement. Cardiac: S1/S2, irregularly irregular rhythm, no murmurs, No gallops or rubs. Respiratory: has crackles bilaterally. GI: Soft, nondistended, nontender, no rebound pain, no organomegaly, BS present. GU: No hematuria Ext: 1+ pitting leg edema bilaterally. 1+DP/PT pulse bilaterally. Musculoskeletal: No joint deformities, No joint redness or warmth, no limitation of ROM in spin. Skin: No rashes.  Neuro: Alert, oriented X3, cranial nerves II-XII grossly intact, moves all extremities normally. Psych: Patient is not psychotic, no suicidal or hemocidal  ideation.  Labs on Admission: I have personally reviewed following labs and imaging studies  CBC: Recent Labs  Lab 11/27/23 1149  WBC 8.0  HGB 13.3  HCT 39.9  MCV 95.7  PLT 218   Basic Metabolic Panel: Recent Labs  Lab 11/27/23 1149  NA 137  K 3.9  CL 109  CO2 23  GLUCOSE 115*  BUN 20  CREATININE 0.95  CALCIUM 8.4*   GFR: Estimated Creatinine Clearance: 51.6 mL/min (by C-G formula based on SCr of 0.95 mg/dL). Liver Function Tests: No results for input(s): "AST", "ALT", "ALKPHOS", "BILITOT", "PROT", "ALBUMIN" in the last 168 hours. No results for input(s): "LIPASE", "AMYLASE" in the last 168 hours. No results for input(s): "AMMONIA" in the last 168 hours. Coagulation Profile: No results for input(s): "INR", "PROTIME" in the last 168 hours. Cardiac Enzymes: No results for input(s): "CKTOTAL", "CKMB", "CKMBINDEX", "TROPONINI" in the last 168 hours. BNP (last 3 results) No results for input(s): "PROBNP" in the last 8760 hours. HbA1C: No results for input(s): "HGBA1C" in the last 72 hours. CBG: No results for input(s): "GLUCAP" in the last 168 hours. Lipid Profile: No results for input(s): "CHOL", "HDL", "LDLCALC", "TRIG", "CHOLHDL", "LDLDIRECT" in the last 72 hours. Thyroid Function Tests: No results for input(s): "TSH", "T4TOTAL", "FREET4", "T3FREE", "THYROIDAB" in the last 72 hours. Anemia Panel: No results for input(s): "VITAMINB12", "FOLATE", "FERRITIN", "TIBC", "IRON", "RETICCTPCT" in the last 72 hours. Urine analysis:    Component Value Date/Time   COLORURINE YELLOW 11/11/2023 1500   APPEARANCEUR CLEAR 11/11/2023 1500   LABSPEC 1.008 11/11/2023 1500   PHURINE 6.0 11/11/2023 1500   GLUCOSEU NEGATIVE 11/11/2023 1500   HGBUR NEGATIVE 11/11/2023 1500   BILIRUBINUR NEGATIVE 11/11/2023 1500   KETONESUR NEGATIVE 11/11/2023 1500   PROTEINUR NEGATIVE 11/11/2023 1500   NITRITE NEGATIVE 11/11/2023 1500   LEUKOCYTESUR NEGATIVE 11/11/2023 1500   Sepsis  Labs: @LABRCNTIP (procalcitonin:4,lacticidven:4) ) Recent Results (from the past 240 hours)  Resp panel by RT-PCR (RSV, Flu A&B, Covid) Anterior Nasal Swab     Status: None   Collection Time: 11/27/23  2:45 PM   Specimen: Anterior Nasal Swab  Result Value Ref Range Status   SARS Coronavirus 2 by RT PCR NEGATIVE NEGATIVE Final    Comment: (NOTE) SARS-CoV-2 target nucleic acids are NOT DETECTED.  The SARS-CoV-2 RNA is generally detectable in upper respiratory specimens during the acute phase of infection. The lowest concentration of SARS-CoV-2 viral copies this assay can detect is 138 copies/mL. A negative result does not preclude SARS-Cov-2 infection and should  not be used as the sole basis for treatment or other patient management decisions. A negative result may occur with  improper specimen collection/handling, submission of specimen other than nasopharyngeal swab, presence of viral mutation(s) within the areas targeted by this assay, and inadequate number of viral copies(<138 copies/mL). A negative result must be combined with clinical observations, patient history, and epidemiological information. The expected result is Negative.  Fact Sheet for Patients:  BloggerCourse.com  Fact Sheet for Healthcare Providers:  SeriousBroker.it  This test is no t yet approved or cleared by the [place] FDA and  has been authorized for detection and/or diagnosis of SARS-CoV-2 by FDA under an Emergency Use Authorization (EUA). This EUA will remain  in effect (meaning this test can be used) for the duration of the COVID-19 declaration under Section 564(b)(1) of the Act, 21 U.S.C.section 360bbb-3(b)(1), unless the authorization is terminated  or revoked sooner.       Influenza A by PCR NEGATIVE NEGATIVE Final   Influenza B by PCR NEGATIVE NEGATIVE Final    Comment: (NOTE) The Xpert Xpress SARS-CoV-2/FLU/RSV plus assay is intended as an  aid in the diagnosis of influenza from Nasopharyngeal swab specimens and should not be used as a sole basis for treatment. Nasal washings and aspirates are unacceptable for Xpert Xpress SARS-CoV-2/FLU/RSV testing.  Fact Sheet for Patients: BloggerCourse.com  Fact Sheet for Healthcare Providers: SeriousBroker.it  This test is not yet approved or cleared by the [place] FDA and has been authorized for detection and/or diagnosis of SARS-CoV-2 by FDA under an Emergency Use Authorization (EUA). This EUA will remain in effect (meaning this test can be used) for the duration of the COVID-19 declaration under Section 564(b)(1) of the Act, 21 U.S.C. section 360bbb-3(b)(1), unless the authorization is terminated or revoked.     Resp Syncytial Virus by PCR NEGATIVE NEGATIVE Final    Comment: (NOTE) Fact Sheet for Patients: BloggerCourse.com  Fact Sheet for Healthcare Providers: SeriousBroker.it  This test is not yet approved or cleared by the [place] FDA and has been authorized for detection and/or diagnosis of SARS-CoV-2 by FDA under an Emergency Use Authorization (EUA). This EUA will remain in effect (meaning this test can be used) for the duration of the COVID-19 declaration under Section 564(b)(1) of the Act, 21 U.S.C. section 360bbb-3(b)(1), unless the authorization is terminated or revoked.  Performed at [facility], [address], [place], [place] [address]      Radiological Exams on Admission:   Assessment/Plan Principal Problem:   Acute on chronic combined systolic and diastolic heart failure (HCC) Active Problems:   Atrial fibrillation with rapid ventricular response (HCC)   Essential hypertension   Anxiety and depression   Overweight (BMI 25.0-29.9)   Assessment and Plan:  Acute on chronic combined systolic and diastolic heart failure [facility]):  Patient has some shortness breath, leg edema, elevated BNP 1072, positive JVD, crackles on auscultation, clinically consistent with CHF exacerbation.  No fever or leukocytosis, very low suspicions for pneumonia.  2D echo on 08/23/2023 showed EF of 40-45% with grade 1 diastolic dysfunction.  -Will admit to PCU  as inpatient -Lasix 40 mg bid by IV (pt is given two dose of 20 mg of lasix) -Daily weights -strict I/O's -Low salt diet -Fluid restriction -As needed bronchodilators for shortness of breath  Atrial fibrillation with rapid ventricular response [facility]): Current heart rates 100s.  Patient is s/p of Watchman device placement, not taking anticoagulants currently. -Continue aspirin -Continue home metoprolol 50 mg daily -  As needed IV metoprolol 5 mg every 2 hour for heart rate> 125.  If heart rate is not controlled, will start Cardizem drip.  Essential hypertension -IV hydralazine as needed -Metoprolol  Anxiety and depression -Zoloft  Overweight (BMI 25.0-29.9): Body weight 71.7 kg, BMI 26.29 -Encourage losing weight -Exercise and healthy diet    DVT ppx: SQ Lovenox  Code Status: Full code   Family Communication:  Yes, patient's son at bed side.    Disposition Plan:  Anticipate discharge back to previous environment  Consults called:  none  Admission status and Level of care: Progressive:   as inpt        Dispo: The patient is from: Home              Anticipated d/c is to: Home              Anticipated d/c date is: 2 days              Patient currently is not medically stable to d/c.    Severity of Illness:  The appropriate patient status for this patient is INPATIENT. Inpatient status is judged to be reasonable and necessary in order to provide the required intensity of service to ensure the patient's safety. The patient's presenting symptoms, physical exam findings, and initial radiographic and laboratory data in the context of their chronic comorbidities is felt to  place them at high risk for further clinical deterioration. Furthermore, it is not anticipated that the patient will be medically stable for discharge from the hospital within 2 midnights of admission.   * I certify that at the point of admission it is my clinical judgment that the patient will require inpatient hospital care spanning beyond 2 midnights from the point of admission due to high intensity of service, high risk for further deterioration and high frequency of surveillance required.*       Date of Service 11/27/2023    Lorretta Harp [facility]   If 7PM-7AM, please contact night-coverage [email or web address] 11/27/2023, 9:12 PM

## 2023-11-27 NOTE — ED Notes (Signed)
 Report called to Casimiro Needle, RN.  Patient transported to Cpod.

## 2023-11-27 NOTE — ED Provider Notes (Signed)
 3:34 PM Assumed care for off going team.   Blood pressure (!) 147/101, pulse 89, temperature 97.8 F (36.6 C), temperature source Oral, resp. rate 20, height 5\' 5"  (1.651 m), weight 71.7 kg, SpO2 100%.  See their HPI for full report but in brief penidng CT labs  IMPRESSION: 1. No pulmonary embolus. 2. Small to moderate bilateral pleural effusions, increased from prior CT. Associated compressive atelectasis. 3. Improvement but persistent patchy ground-glass opacities primarily in the right lung, likely resolving infection. 4. Mild cardiomegaly. Contrast refluxes into the hepatic veins and IVC suggesting right heart dysfunction. 5. Mildly enlarged right hilar lymph node, likely reactive.    Discussed with patient CT results will admit for concern for CHF.  Patient also in A-fib noted with heart rates going up to the 140s will give 5 of IV metoprolol given she takes oral metoprolol at home.   .Critical Care  Performed by: Concha Se, MD Authorized by: Concha Se, MD   Critical care provider statement:    Critical care time (minutes):  30   Critical care was necessary to treat or prevent imminent or life-threatening deterioration of the following conditions:  Cardiac failure   Critical care was time spent personally by me on the following activities:  Development of treatment plan with patient or surrogate, discussions with consultants, evaluation of patient's response to treatment, examination of patient, ordering and review of laboratory studies, ordering and review of radiographic studies, ordering and performing treatments and interventions, pulse oximetry, re-evaluation of patient's condition and review of old charts         Concha Se, MD 11/27/23 1850

## 2023-11-27 NOTE — ED Provider Notes (Signed)
 [facility] Provider Note    Event Date/Time   First MD Initiated Contact with Patient 11/27/23 1335     (approximate)   History   Chest Pain   HPI  Stephanie [name] is a 73 y.o. female past medical history significant for atrial fibrillation with watchman's procedure not on anticoagulation, CAD, who presents to the emergency department with shortness of breath and chest pain.  States that she woke up this morning with shortness of breath and felt like she was unable to catch her breath.  Felt like she was having chest pain that was worse with deep inspiration.  Tried an albuterol inhaler without significant improvement.  States that since arriving to the emergency department she has noted swelling to her legs that is worse in the right leg.  No history of heart failure and does not take any Lasix.  Not on anticoagulation since she has had the watchman's procedure.  Patient is chronically in atrial fibrillation.  Denies any history of DVT or PE.  Does endorse recent hospitalization for pneumonia.  Denies fever, chills, cough, nausea or vomiting.  Denies any fever or chills.     Physical Exam   Triage Vital Signs: ED Triage Vitals  Encounter Vitals Group     BP 11/27/23 1147 (!) 126/92     Systolic BP Percentile --      Diastolic BP Percentile --      Pulse Rate 11/27/23 1147 96     Resp 11/27/23 1147 20     Temp 11/27/23 1147 97.8 F (36.6 C)     Temp Source 11/27/23 1147 Oral     SpO2 11/27/23 1147 100 %     Weight 11/27/23 1145 157 lb 15.7 oz (71.7 kg)     Height 11/27/23 1145 5\' 5"  (1.651 m)     Head Circumference --      Peak Flow --      Pain Score 11/27/23 1144 4     Pain Loc --      Pain Education --      Exclude from Growth Chart --     Most recent vital signs: Vitals:   11/27/23 1430 11/27/23 1445  BP: (!) 147/101   Pulse: 99 89  Resp: (!) 26 20  Temp:    SpO2: 100% 100%    Physical Exam Constitutional:      Appearance: She is  well-developed.  HENT:     Head: Atraumatic.  Eyes:     Conjunctiva/sclera: Conjunctivae normal.  Cardiovascular:     Rate and Rhythm: Regular rhythm.     Heart sounds: Normal heart sounds.  Pulmonary:     Effort: No respiratory distress.     Breath sounds: Rhonchi (Bilateral lower lung fields) present.  Abdominal:     General: There is no distension.  Musculoskeletal:        General: Normal range of motion.     Cervical back: Normal range of motion.     Right lower leg: Edema present.     Left lower leg: Edema present.     Comments: Worsening swelling the right lower extremity when compared to the left.  +2 DP pulses that are equal bilaterally.  +2 radial pulses that are equal bilaterally.  No wheezing on exam.  Speaking full sentences.  Skin:    General: Skin is warm.     Capillary Refill: Capillary refill takes less than 2 seconds.  Neurological:     Mental Status: She  is alert. Mental status is at baseline.     IMPRESSION / MDM / ASSESSMENT AND PLAN / ED COURSE  I reviewed the triage vital signs and the nursing notes.  On chart review patient had a hospitalization in the end of December for an NSTEMI.  Recently treated for community-acquired pneumonia to the right lower lobe of the lung.  Differential diagnosis including pulmonary embolism, new onset heart failure, ACS, anemia, pneumonia  EKG  I, Corena Herter, the attending physician, personally viewed and interpreted this ECG.   Rate: Normal  Rhythm: Normal sinus  Axis: Normal  Intervals: Normal  ST&T Change: None  Atrial fibrillation on cardiac telemetry  RADIOLOGY I independently reviewed imaging, my interpretation of imaging: Chest x-ray with findings concerning for underlying atypical pneumonia versus edema.  No significant change when compared to prior chest x-ray done in February.  Read as persistent patchy bibasilar airspace disease suspicious for pneumonia.  LABS (all labs ordered are listed, but only  abnormal results are displayed) Labs interpreted as -    Labs Reviewed  BASIC METABOLIC PANEL - Abnormal; Notable for the following components:      Result Value   Glucose, Bld 115 (*)    Calcium 8.4 (*)    All other components within normal limits  BRAIN NATRIURETIC PEPTIDE - Abnormal; Notable for the following components:   B Natriuretic Peptide 1,072.2 (*)    All other components within normal limits  RESP PANEL BY RT-PCR (RSV, FLU A&B, COVID)  [id number]  CBC  TROPONIN I (HIGH SENSITIVITY)  TROPONIN I (HIGH SENSITIVITY)     MDM  Given pleuritic chest pain, tachycardia and right lower leg unilateral swelling moderate risk Wells criteria plan for CT angiography to further evaluate for pulmonary embolism.  Chest x-ray with findings of pneumonia.  Patient states that she has not been coughing and she has been feeling better since antibiotic treatment.  Initial troponin negative.  Adding on BNP to further evaluate for new onset heart failure.  BNP significantly elevated.  Will give 1 dose of IV Lasix of 20 mg.  CTA is currently pending.  Care transferred to incoming provider.     PROCEDURES:  Critical Care performed: No  Procedures  Patient's presentation is most consistent with acute presentation with potential threat to life or bodily function.   MEDICATIONS ORDERED IN ED: Medications  furosemide (LASIX) injection 20 mg (has no administration in time range)    FINAL CLINICAL IMPRESSION(S) / ED DIAGNOSES   Final diagnoses:  Chest pain on breathing     Rx / DC Orders   ED Discharge Orders     None        Note:  This document was prepared using Dragon voice recognition software and may include unintentional dictation errors.   Corena Herter, MD 11/27/23 1539

## 2023-11-28 DIAGNOSIS — I509 Heart failure, unspecified: Secondary | ICD-10-CM

## 2023-11-28 DIAGNOSIS — I5043 Acute on chronic combined systolic (congestive) and diastolic (congestive) heart failure: Secondary | ICD-10-CM | POA: Diagnosis not present

## 2023-11-28 LAB — BASIC METABOLIC PANEL
Anion gap: 7 (ref 5–15)
BUN: 17 mg/dL (ref 8–23)
CO2: 29 mmol/L (ref 22–32)
Calcium: 8.1 mg/dL — ABNORMAL LOW (ref 8.9–10.3)
Chloride: 105 mmol/L (ref 98–111)
Creatinine, Ser: 0.98 mg/dL (ref 0.44–1.00)
GFR, Estimated: >60 mL/min (ref >60)
Glucose, Bld: 96 mg/dL (ref 70–99)
Potassium: 3.4 mmol/L — ABNORMAL LOW (ref 3.5–5.1)
Sodium: 141 mmol/L (ref 135–145)

## 2023-11-28 LAB — MAGNESIUM: Magnesium: 2.2 mg/dL (ref 1.7–2.4)

## 2023-11-28 MED ORDER — FUROSEMIDE 10 MG/ML IJ SOLN
40.0000 mg | Freq: Once | INTRAMUSCULAR | Status: AC
  Administered 2023-11-28: 40 mg via INTRAVENOUS
  Filled 2023-11-28: qty 4

## 2023-11-28 MED ORDER — METOPROLOL SUCCINATE ER 100 MG PO TB24
100.0000 mg | ORAL_TABLET | Freq: Every day | ORAL | Status: DC
  Administered 2023-11-29 – 2023-11-30 (×2): 100 mg via ORAL
  Filled 2023-11-28: qty 2
  Filled 2023-11-28: qty 1

## 2023-11-28 MED ORDER — METOPROLOL TARTRATE 25 MG PO TABS
50.0000 mg | ORAL_TABLET | Freq: Once | ORAL | Status: AC
  Administered 2023-11-28: 50 mg via ORAL
  Filled 2023-11-28: qty 2

## 2023-11-28 MED ORDER — FUROSEMIDE 10 MG/ML IJ SOLN
40.0000 mg | Freq: Once | INTRAMUSCULAR | Status: AC
  Administered 2023-11-29: 40 mg via INTRAVENOUS
  Filled 2023-11-28: qty 4

## 2023-11-28 NOTE — Hospital Course (Addendum)
 Hospital course / significant events:   HPI: Stephanie [name] is a 73 y.o. female with medical history significant of CHF with EF 40-45%, HTN, HLD, depression, A-fib (s/p of Watchman device placement) not on anticoagulants, restless leg syndrome, who presents with SOB and palpitation early AM before coming to the ED mid-day sent by [facility] for Afib RVR.   03/08: in ED, HR 140s --> 103 following metoprolol IV. Admitted to hospitalist. Of note, CTA no PE, small/mod bilateral pleural effusions, R lung appears resolving infection, cardiomegaly w/ contrast refluxing into hepatic veins and IVC suggesting R heart dysfunction.  03/09: HR improved, still rales, SOB, edema. Continue diuresis.  03/10: good UOP, AKI however, lasix dc, on room air. Informal d/w cardiology, plan for echo today and start eliquis on discharge will try to get the ablation scheduled sooner if possible. Still tachycardic, will see how the day goes w/ high dose metoprolol      Consultants:  none  Procedures/Surgeries: none      ASSESSMENT & PLAN:   Acute on chronic combined systolic and diastolic heart failure  Patient has some shortness breath, leg edema, elevated BNP 1072, positive JVD, crackles on auscultation, clinically consistent with CHF exacerbation.  No fever or leukocytosis, very low suspicions for pneumonia.   Recent 2D echo on 08/23/2023 showed EF of 40-45% with grade 1 diastolic dysfunction. Will not repeat echo now.  Lasix to po  Daily weights strict I/O's Low salt diet Fluid restriction   Atrial fibrillation with rapid ventricular response, RVR resolved Patient is s/p Watchman device placement, not taking anticoagulants currently. Continue aspirin Increased home metoprolol 50 mg daily to 100 As needed IV metoprolol 5 mg every 2 hour for heart rate> 125.  If heart rate is not controlled, will start Cardizem drip. Pt requesting if cardiology can move up ablation procedure, will reach out on Monday,  do not anticipate change in schedule at this point   AKI d/t diuresis Holding lasix today Monitor BMP  Essential hypertension IV hydralazine as needed Metoprolol   Anxiety and depression Zoloft     overweight based on BMI: Body mass index is 26.29 kg/m.  Underweight - under 18  overweight - 25 to 29 obese - 30 or more Class 1 obesity: BMI of 30.0 to 34 Class 2 obesity: BMI of 35.0 to 39 Class 3 obesity: BMI of 40.0 to 49 Super Morbid Obesity: BMI 50-59 Super-super Morbid Obesity: BMI 60+ Significantly low or high BMI is associated with higher medical risk.  Weight management advised as adjunct to other disease management and risk reduction treatments    DVT prophylaxis: lovenox  IV fluids: no continuous IV fluids  Nutrition: cardiac Central lines / invasive devices: none  Code Status: FULL CODE ACP documentation reviewed:  none on file in [facility]  TOC needs: TBD Barriers to dispo / significant pending items: rate control, AKI vs diuresing, expect medically stable tomorrow

## 2023-11-28 NOTE — ED Notes (Signed)
 Pt provided with snacks. Denies any further needs at this time. CB remains within reach. Pt encouraged to alert staff to any needs.

## 2023-11-28 NOTE — Progress Notes (Signed)
 PROGRESS NOTE    Jaquetta Currier   JWJ:[id number] DOB: [birth year]  DOA: 11/27/2023 Date of Service: 11/28/23 which is hospital day 1  PCP: Patient, No Pcp Per    Hospital course / significant events:   HPI: Stephanie [name] is a 73 y.o. female with medical history significant of CHF with EF 40-45%, HTN, HLD, depression, A-fib (s/p of Watchman device placement) not on anticoagulants, restless leg syndrome, who presents with SOB and palpitation early AM before coming to the ED mid-day sent by [facility] for Afib RVR.   03/08: in ED, HR 140s --> 103 following metoprolol IV. Admitted to hospitalist. Of note, CTA no PE, small/mod bilateral pleural effusions, R lung appears resolving infection, cardiomegaly w/ contrast refluxing into hepatic veins and IVC suggesting R heart dysfunction.  03/09: HR improved, still rales, SOB, edema. Continue diuresis.      Consultants:  none  Procedures/Surgeries: none      ASSESSMENT & PLAN:   Acute on chronic combined systolic and diastolic heart failure  Patient has some shortness breath, leg edema, elevated BNP 1072, positive JVD, crackles on auscultation, clinically consistent with CHF exacerbation.  No fever or leukocytosis, very low suspicions for pneumonia.   Recent 2D echo on 08/23/2023 showed EF of 40-45% with grade 1 diastolic dysfunction. Will not repeat echo now.  Lasix 40 mg bid IV Daily weights strict I/O's Low salt diet Fluid restriction   Atrial fibrillation with rapid ventricular response, RVR resolved Patient is s/p Watchman device placement, not taking anticoagulants currently. Continue aspirin Increased home metoprolol 50 mg daily to 100 As needed IV metoprolol 5 mg every 2 hour for heart rate> 125.  If heart rate is not controlled, will start Cardizem drip. Pt requesting if cardiology can move up ablation procedure, will reach out on Monday, do not anticipate change in schedule at this point   Essential hypertension IV  hydralazine as needed Metoprolol   Anxiety and depression Zoloft     overweight based on BMI: Body mass index is 26.29 kg/m.  Underweight - under 18  overweight - 25 to 29 obese - 30 or more Class 1 obesity: BMI of 30.0 to 34 Class 2 obesity: BMI of 35.0 to 39 Class 3 obesity: BMI of 40.0 to 49 Super Morbid Obesity: BMI 50-59 Super-super Morbid Obesity: BMI 60+ Significantly low or high BMI is associated with higher medical risk.  Weight management advised as adjunct to other disease management and risk reduction treatments    DVT prophylaxis: lovenox  IV fluids: no continuous IV fluids  Nutrition: cardiac Central lines / invasive devices: none  Code Status: FULL CODE ACP documentation reviewed:  none on file in [facility]  TOC needs: TBD Barriers to dispo / significant pending items: rate control, diuresing, expect medically stable 1-2 days             Subjective / Brief ROS:  Patient reports SOB, tired, occaional palpiatations  Denies CP Pain controlled.  Denies new weakness.  Tolerating diet .  Reports no concerns w/ urination/defecation.   Family Communication: daughter at bedside on rounds     Objective Findings:  Vitals:   11/28/23 0615 11/28/23 0909 11/28/23 1000 11/28/23 1508  BP:  101/84 121/87 102/72  Pulse:  (!) 111 (!) 101 93  Resp:  19 13 (!) 22  Temp: 97.8 F (36.6 C) (!) 97.4 F (36.3 C)    TempSrc: Oral Oral    SpO2:  100% 100% 96%  Weight:  Height:        Intake/Output Summary (Last 24 hours) at 11/28/2023 1529 Last data filed at 11/28/2023 1200 Gross per 24 hour  Intake --  Output 1200 ml  Net -1200 ml   Filed Weights   11/27/23 1145  Weight: 71.7 kg    Examination:  Physical Exam Constitutional:      General: She is not in acute distress. Cardiovascular:     Rate and Rhythm: Tachycardia present. Rhythm irregular.  Pulmonary:     Effort: Pulmonary effort is normal.     Breath sounds: Rales present.  Abdominal:      Palpations: Abdomen is soft.  Skin:    General: Skin is warm and dry.  Neurological:     General: No focal deficit present.     Mental Status: She is alert and oriented to person, place, and time.  Psychiatric:        Mood and Affect: Mood normal.        Behavior: Behavior normal.          Scheduled Medications:   ascorbic acid  500 mg Oral Daily   calcium-vitamin D  1 tablet Oral Daily   colestipol  1 g Oral Daily   enoxaparin (LOVENOX) injection  40 mg Subcutaneous Q24H   [START ON 11/29/2023] furosemide  40 mg Intravenous Once   [START ON 11/29/2023] metoprolol succinate  100 mg Oral Daily   sertraline  50 mg Oral Daily    Continuous Infusions:   PRN Medications:  acetaminophen, albuterol, dextromethorphan-guaiFENesin, hydrALAZINE, metoprolol tartrate, ondansetron (ZOFRAN) IV  Antimicrobials from admission:  Anti-infectives (From admission, onward)    None           Data Reviewed:  I have personally reviewed the following...  CBC: Recent Labs  Lab 11/27/23 1149  WBC 8.0  HGB 13.3  HCT 39.9  MCV 95.7  PLT 218   Basic Metabolic Panel: Recent Labs  Lab 11/27/23 1149 11/28/23 0447  NA 137 141  K 3.9 3.4*  CL 109 105  CO2 23 29  GLUCOSE 115* 96  BUN 20 17  CREATININE 0.95 0.98  CALCIUM 8.4* 8.1*  MG  --  2.2   GFR: Estimated Creatinine Clearance: 50 mL/min (by C-G formula based on SCr of 0.98 mg/dL). Liver Function Tests: No results for input(s): "AST", "ALT", "ALKPHOS", "BILITOT", "PROT", "ALBUMIN" in the last 168 hours. No results for input(s): "LIPASE", "AMYLASE" in the last 168 hours. No results for input(s): "AMMONIA" in the last 168 hours. Coagulation Profile: No results for input(s): "INR", "PROTIME" in the last 168 hours. Cardiac Enzymes: No results for input(s): "CKTOTAL", "CKMB", "CKMBINDEX", "TROPONINI" in the last 168 hours. BNP (last 3 results) No results for input(s): "PROBNP" in the last 8760 hours. HbA1C: No  results for input(s): "HGBA1C" in the last 72 hours. CBG: No results for input(s): "GLUCAP" in the last 168 hours. Lipid Profile: No results for input(s): "CHOL", "HDL", "LDLCALC", "TRIG", "CHOLHDL", "LDLDIRECT" in the last 72 hours. Thyroid Function Tests: No results for input(s): "TSH", "T4TOTAL", "FREET4", "T3FREE", "THYROIDAB" in the last 72 hours. Anemia Panel: No results for input(s): "VITAMINB12", "FOLATE", "FERRITIN", "TIBC", "IRON", "RETICCTPCT" in the last 72 hours. Most Recent Urinalysis On File:     Component Value Date/Time   COLORURINE YELLOW 11/11/2023 1500   APPEARANCEUR CLEAR 11/11/2023 1500   LABSPEC 1.008 11/11/2023 1500   PHURINE 6.0 11/11/2023 1500   GLUCOSEU NEGATIVE 11/11/2023 1500   HGBUR NEGATIVE 11/11/2023 1500   BILIRUBINUR NEGATIVE  11/11/2023 1500   KETONESUR NEGATIVE 11/11/2023 1500   PROTEINUR NEGATIVE 11/11/2023 1500   NITRITE NEGATIVE 11/11/2023 1500   LEUKOCYTESUR NEGATIVE 11/11/2023 1500   Sepsis Labs: @LABRCNTIP (procalcitonin:4,lacticidven:4) Microbiology: Recent Results (from the past 240 hours)  Resp panel by RT-PCR (RSV, Flu A&B, Covid) Anterior Nasal Swab     Status: None   Collection Time: 11/27/23  2:45 PM   Specimen: Anterior Nasal Swab  Result Value Ref Range Status   SARS Coronavirus 2 by RT PCR NEGATIVE NEGATIVE Final    Comment: (NOTE) SARS-CoV-2 target nucleic acids are NOT DETECTED.  The SARS-CoV-2 RNA is generally detectable in upper respiratory specimens during the acute phase of infection. The lowest concentration of SARS-CoV-2 viral copies this assay can detect is 138 copies/mL. A negative result does not preclude SARS-Cov-2 infection and should not be used as the sole basis for treatment or other patient management decisions. A negative result may occur with  improper specimen collection/handling, submission of specimen other than nasopharyngeal swab, presence of viral mutation(s) within the areas targeted by this assay,  and inadequate number of viral copies(<138 copies/mL). A negative result must be combined with clinical observations, patient history, and epidemiological information. The expected result is Negative.  Fact Sheet for Patients:  BloggerCourse.com  Fact Sheet for Healthcare Providers:  SeriousBroker.it  This test is no t yet approved or cleared by the [place] FDA and  has been authorized for detection and/or diagnosis of SARS-CoV-2 by FDA under an Emergency Use Authorization (EUA). This EUA will remain  in effect (meaning this test can be used) for the duration of the COVID-19 declaration under Section 564(b)(1) of the Act, 21 U.S.C.section 360bbb-3(b)(1), unless the authorization is terminated  or revoked sooner.       Influenza A by PCR NEGATIVE NEGATIVE Final   Influenza B by PCR NEGATIVE NEGATIVE Final    Comment: (NOTE) The Xpert Xpress SARS-CoV-2/FLU/RSV plus assay is intended as an aid in the diagnosis of influenza from Nasopharyngeal swab specimens and should not be used as a sole basis for treatment. Nasal washings and aspirates are unacceptable for Xpert Xpress SARS-CoV-2/FLU/RSV testing.  Fact Sheet for Patients: BloggerCourse.com  Fact Sheet for Healthcare Providers: SeriousBroker.it  This test is not yet approved or cleared by the [place] FDA and has been authorized for detection and/or diagnosis of SARS-CoV-2 by FDA under an Emergency Use Authorization (EUA). This EUA will remain in effect (meaning this test can be used) for the duration of the COVID-19 declaration under Section 564(b)(1) of the Act, 21 U.S.C. section 360bbb-3(b)(1), unless the authorization is terminated or revoked.     Resp Syncytial Virus by PCR NEGATIVE NEGATIVE Final    Comment: (NOTE) Fact Sheet for Patients: BloggerCourse.com  Fact Sheet for  Healthcare Providers: SeriousBroker.it  This test is not yet approved or cleared by the [place] FDA and has been authorized for detection and/or diagnosis of SARS-CoV-2 by FDA under an Emergency Use Authorization (EUA). This EUA will remain in effect (meaning this test can be used) for the duration of the COVID-19 declaration under Section 564(b)(1) of the Act, 21 U.S.C. section 360bbb-3(b)(1), unless the authorization is terminated or revoked.  Performed at [facility], [address], [place], [place] [address]       Radiology Studies last 3 days: CT Angio Chest Pulmonary Embolism (PE) W or WO Contrast Result Date: 11/27/2023 CLINICAL DATA:  Pulmonary embolism (PE) suspected, high prob Chest pain. EXAM: CT ANGIOGRAPHY CHEST WITH CONTRAST TECHNIQUE:  Multidetector CT imaging of the chest was performed using the standard protocol during bolus administration of intravenous contrast. Multiplanar CT image reconstructions and MIPs were obtained to evaluate the vascular anatomy. RADIATION DOSE REDUCTION: This exam was performed according to the departmental dose-optimization program which includes automated exposure control, adjustment of the mA and/or kV according to patient size and/or use of iterative reconstruction technique. CONTRAST:  75mL OMNIPAQUE IOHEXOL 350 MG/ML SOLN COMPARISON:  Radiograph earlier today. Chest CTA 10 days ago 11/17/2023 FINDINGS: Cardiovascular: There are no filling defects within the pulmonary arteries to suggest pulmonary embolus. Aortic atherosclerosis without aneurysm. No periaortic stranding. No contrast in the aorta. Left atrial occlusion device. The heart is mildly enlarged. Contrast refluxes into the hepatic veins and IVC. No pericardial effusion. Mediastinum/Nodes: 18 mm right hilar node, previously 14 mm. Scattered prominent mediastinal lymph nodes. Again seen 17 mm left thyroid nodule. Decompressed esophagus. Lungs/Pleura:  Small to moderate bilateral pleural effusions, increased from prior CT. Associated compressive atelectasis. Improvement but persistent patchy ground-glass opacities primarily in the right lung. No new or progressive airspace disease. There is mild central bronchial thickening Upper Abdomen: Contrast refluxes into the hepatic veins and IVC. Musculoskeletal: Mild thoracic spondylosis. There are no acute or suspicious osseous abnormalities. Review of the MIP images confirms the above findings. IMPRESSION: 1. No pulmonary embolus. 2. Small to moderate bilateral pleural effusions, increased from prior CT. Associated compressive atelectasis. 3. Improvement but persistent patchy ground-glass opacities primarily in the right lung, likely resolving infection. 4. Mild cardiomegaly. Contrast refluxes into the hepatic veins and IVC suggesting right heart dysfunction. 5. Mildly enlarged right hilar lymph node, likely reactive. Aortic Atherosclerosis (ICD10-I70.0). Electronically Signed   By: Narda Rutherford M.D.   On: 11/27/2023 18:26   DG Chest 2 View Result Date: 11/27/2023 CLINICAL DATA:  Chest pain. EXAM: CHEST - 2 VIEW COMPARISON:  11/16/2023.  Chest CTA 11/17/2023. FINDINGS: Lower lung predominant patchy and nodular ill-defined airspace opacity noted. Nodular density/densities projecting over the lungs are compatible with pads for telemetry leads. There is dependent atelectasis with small left pleural effusion. Cardiopericardial silhouette is at upper limits of normal for size. Watchman device overlies the left atrial appendage. Bones are diffusely demineralized. IMPRESSION: Persistent basilar patchy and nodular airspace disease suspicious for pneumonia. Small left pleural effusion. Electronically Signed   By: Kennith Center M.D.   On: 11/27/2023 12:28          Sunnie Nielsen, DO [facility] 11/28/2023, 3:29 PM    Dictation software may have been used to generate the above note. Typos may occur and  escape review in typed/dictated notes. Please contact Dr Lyn Hollingshead directly for clarity if needed.  Staff may message me via secure chat in Epic  but this may not receive an immediate response,  please page me for urgent matters!  If 7PM-7AM, please contact night coverage [email or web address]

## 2023-11-29 DIAGNOSIS — I5043 Acute on chronic combined systolic (congestive) and diastolic (congestive) heart failure: Secondary | ICD-10-CM | POA: Diagnosis not present

## 2023-11-29 LAB — CBC
HCT: 41.7 % (ref 36.0–46.0)
Hemoglobin: 14 g/dL (ref 12.0–15.0)
MCH: 31.7 pg (ref 26.0–34.0)
MCHC: 33.6 g/dL (ref 30.0–36.0)
MCV: 94.3 fL (ref 80.0–100.0)
Platelets: 201 10*3/uL (ref 150–400)
RBC: 4.42 MIL/uL (ref 3.87–5.11)
RDW: 14.1 % (ref 11.5–15.5)
WBC: 7.6 10*3/uL (ref 4.0–10.5)
nRBC: 0 % (ref 0.0–0.2)

## 2023-11-29 LAB — BASIC METABOLIC PANEL
Anion gap: 12 (ref 5–15)
BUN: 24 mg/dL — ABNORMAL HIGH (ref 8–23)
CO2: 31 mmol/L (ref 22–32)
Calcium: 8.9 mg/dL (ref 8.9–10.3)
Chloride: 96 mmol/L — ABNORMAL LOW (ref 98–111)
Creatinine, Ser: 1.43 mg/dL — ABNORMAL HIGH (ref 0.44–1.00)
GFR, Estimated: 38 mL/min — ABNORMAL LOW (ref >60)
Glucose, Bld: 104 mg/dL — ABNORMAL HIGH (ref 70–99)
Potassium: 3.9 mmol/L (ref 3.5–5.1)
Sodium: 139 mmol/L (ref 135–145)

## 2023-11-29 MED ORDER — FUROSEMIDE 20 MG PO TABS
20.0000 mg | ORAL_TABLET | Freq: Two times a day (BID) | ORAL | Status: DC
  Administered 2023-11-30: 20 mg via ORAL
  Filled 2023-11-29: qty 1

## 2023-11-29 NOTE — Progress Notes (Signed)
 PROGRESS NOTE    Stephanie [name]   ZOX:[id number] DOB: Nov 12, 1950  DOA: 11/27/2023 Date of Service: 11/29/23 which is hospital day 2  PCP: Patient, No Pcp Per    Hospital course / significant events:   HPI: Stephanie [name] is a 73 y.o. female with medical history significant of CHF with EF 40-45%, HTN, HLD, depression, A-fib (s/p of Watchman device placement) not on anticoagulants, restless leg syndrome, who presents with SOB and palpitation early AM before coming to the ED mid-day sent by [facility] for Afib RVR.   03/08: in ED, HR 140s --> 103 following metoprolol IV. Admitted to hospitalist. Of note, CTA no PE, small/mod bilateral pleural effusions, R lung appears resolving infection, cardiomegaly w/ contrast refluxing into hepatic veins and IVC suggesting R heart dysfunction.  03/09: HR improved, still rales, SOB, edema. Continue diuresis.  03/10: good UOP, AKI however, lasix dc, on room air. Informal d/w cardiology, plan for echo today and start eliquis on discharge will try to get the ablation scheduled sooner if possible. Still tachycardic, will see how the day goes w/ high dose metoprolol      Consultants:  none  Procedures/Surgeries: none      ASSESSMENT & PLAN:   Acute on chronic combined systolic and diastolic heart failure  Patient has some shortness breath, leg edema, elevated BNP 1072, positive JVD, crackles on auscultation, clinically consistent with CHF exacerbation.  No fever or leukocytosis, very low suspicions for pneumonia.   Recent 2D echo on 08/23/2023 showed EF of 40-45% with grade 1 diastolic dysfunction. Will not repeat echo now.  Lasix to po  Daily weights strict I/O's Low salt diet Fluid restriction   Atrial fibrillation with rapid ventricular response, RVR resolved Patient is s/p Watchman device placement, not taking anticoagulants currently. Continue aspirin Increased home metoprolol 50 mg daily to 100 As needed IV metoprolol 5 mg every 2  hour for heart rate> 125.  If heart rate is not controlled, will start Cardizem drip. Pt requesting if cardiology can move up ablation procedure, will reach out on Monday, do not anticipate change in schedule at this point   AKI d/t diuresis Holding lasix today Monitor BMP  Essential hypertension IV hydralazine as needed Metoprolol   Anxiety and depression Zoloft     overweight based on BMI: Body mass index is 26.29 kg/m.  Underweight - under 18  overweight - 25 to 29 obese - 30 or more Class 1 obesity: BMI of 30.0 to 34 Class 2 obesity: BMI of 35.0 to 39 Class 3 obesity: BMI of 40.0 to 49 Super Morbid Obesity: BMI 50-59 Super-super Morbid Obesity: BMI 60+ Significantly low or high BMI is associated with higher medical risk.  Weight management advised as adjunct to other disease management and risk reduction treatments    DVT prophylaxis: lovenox  IV fluids: no continuous IV fluids  Nutrition: cardiac Central lines / invasive devices: none  Code Status: FULL CODE ACP documentation reviewed:  none on file in [facility]  TOC needs: TBD Barriers to dispo / significant pending items: rate control, AKI vs diuresing, expect medically stable tomorrow             Subjective / Brief ROS:  Patient reports SOB, tired, occaional palpiatations  Denies CP Pain controlled.  Denies new weakness.  Tolerating diet .  Reports no concerns w/ urination/defecation.   Family Communication: daughter at bedside on rounds     Objective Findings:  Vitals:   11/29/23 1145 11/29/23 1200 11/29/23 1245  11/29/23 1300  BP:  101/74    Pulse: (!) 119 100 (!) 115 96  Resp: (!) 23 (!) 23 (!) 23 17  Temp:      TempSrc:      SpO2: 99% 97% 96% 98%  Weight:      Height:       No intake or output data in the 24 hours ending 11/29/23 1346  Filed Weights   11/27/23 1145  Weight: 71.7 kg    Examination:  Physical Exam Constitutional:      General: She is not in acute  distress. Cardiovascular:     Rate and Rhythm: Tachycardia present. Rhythm irregular.  Pulmonary:     Effort: Pulmonary effort is normal.     Breath sounds: Rales (improved from yesterday) present.  Abdominal:     Palpations: Abdomen is soft.  Skin:    General: Skin is warm and dry.  Neurological:     General: No focal deficit present.     Mental Status: She is alert and oriented to person, place, and time.  Psychiatric:        Mood and Affect: Mood normal.        Behavior: Behavior normal.          Scheduled Medications:   ascorbic acid  500 mg Oral Daily   calcium-vitamin D  1 tablet Oral Daily   colestipol  1 g Oral Daily   enoxaparin (LOVENOX) injection  40 mg Subcutaneous Q24H   [START ON 11/30/2023] furosemide  20 mg Oral BID   metoprolol succinate  100 mg Oral Daily   sertraline  50 mg Oral Daily    Continuous Infusions:   PRN Medications:  acetaminophen, albuterol, dextromethorphan-guaiFENesin, hydrALAZINE, metoprolol tartrate, ondansetron (ZOFRAN) IV  Antimicrobials from admission:  Anti-infectives (From admission, onward)    None           Data Reviewed:  I have personally reviewed the following...  CBC: Recent Labs  Lab 11/27/23 1149 11/29/23 0313  WBC 8.0 7.6  HGB 13.3 14.0  HCT 39.9 41.7  MCV 95.7 94.3  PLT 218 201   Basic Metabolic Panel: Recent Labs  Lab 11/27/23 1149 11/28/23 0447 11/29/23 0313  NA 137 141 139  K 3.9 3.4* 3.9  CL 109 105 96*  CO2 23 29 31   GLUCOSE 115* 96 104*  BUN 20 17 24*  CREATININE 0.95 0.98 1.43*  CALCIUM 8.4* 8.1* 8.9  MG  --  2.2  --    GFR: Estimated Creatinine Clearance: 34.3 mL/min (A) (by C-G formula based on SCr of 1.43 mg/dL (H)). Liver Function Tests: No results for input(s): "AST", "ALT", "ALKPHOS", "BILITOT", "PROT", "ALBUMIN" in the last 168 hours. No results for input(s): "LIPASE", "AMYLASE" in the last 168 hours. No results for input(s): "AMMONIA" in the last 168  hours. Coagulation Profile: No results for input(s): "INR", "PROTIME" in the last 168 hours. Cardiac Enzymes: No results for input(s): "CKTOTAL", "CKMB", "CKMBINDEX", "TROPONINI" in the last 168 hours. BNP (last 3 results) No results for input(s): "PROBNP" in the last 8760 hours. HbA1C: No results for input(s): "HGBA1C" in the last 72 hours. CBG: No results for input(s): "GLUCAP" in the last 168 hours. Lipid Profile: No results for input(s): "CHOL", "HDL", "LDLCALC", "TRIG", "CHOLHDL", "LDLDIRECT" in the last 72 hours. Thyroid Function Tests: No results for input(s): "TSH", "T4TOTAL", "FREET4", "T3FREE", "THYROIDAB" in the last 72 hours. Anemia Panel: No results for input(s): "VITAMINB12", "FOLATE", "FERRITIN", "TIBC", "IRON", "RETICCTPCT" in the last 72  hours. Most Recent Urinalysis On File:     Component Value Date/Time   COLORURINE YELLOW 11/11/2023 1500   APPEARANCEUR CLEAR 11/11/2023 1500   LABSPEC 1.008 11/11/2023 1500   PHURINE 6.0 11/11/2023 1500   GLUCOSEU NEGATIVE 11/11/2023 1500   HGBUR NEGATIVE 11/11/2023 1500   BILIRUBINUR NEGATIVE 11/11/2023 1500   KETONESUR NEGATIVE 11/11/2023 1500   PROTEINUR NEGATIVE 11/11/2023 1500   NITRITE NEGATIVE 11/11/2023 1500   LEUKOCYTESUR NEGATIVE 11/11/2023 1500   Sepsis Labs: @LABRCNTIP (procalcitonin:4,lacticidven:4) Microbiology: Recent Results (from the past 240 hours)  Resp panel by RT-PCR (RSV, Flu A&B, Covid) Anterior Nasal Swab     Status: None   Collection Time: 11/27/23  2:45 PM   Specimen: Anterior Nasal Swab  Result Value Ref Range Status   SARS Coronavirus 2 by RT PCR NEGATIVE NEGATIVE Final    Comment: (NOTE) SARS-CoV-2 target nucleic acids are NOT DETECTED.  The SARS-CoV-2 RNA is generally detectable in upper respiratory specimens during the acute phase of infection. The lowest concentration of SARS-CoV-2 viral copies this assay can detect is 138 copies/mL. A negative result does not preclude  SARS-Cov-2 infection and should not be used as the sole basis for treatment or other patient management decisions. A negative result may occur with  improper specimen collection/handling, submission of specimen other than nasopharyngeal swab, presence of viral mutation(s) within the areas targeted by this assay, and inadequate number of viral copies(<138 copies/mL). A negative result must be combined with clinical observations, patient history, and epidemiological information. The expected result is Negative.  Fact Sheet for Patients:  BloggerCourse.com  Fact Sheet for Healthcare Providers:  SeriousBroker.it  This test is no t yet approved or cleared by the [place] FDA and  has been authorized for detection and/or diagnosis of SARS-CoV-2 by FDA under an Emergency Use Authorization (EUA). This EUA will remain  in effect (meaning this test can be used) for the duration of the COVID-19 declaration under Section 564(b)(1) of the Act, 21 U.S.C.section 360bbb-3(b)(1), unless the authorization is terminated  or revoked sooner.       Influenza A by PCR NEGATIVE NEGATIVE Final   Influenza B by PCR NEGATIVE NEGATIVE Final    Comment: (NOTE) The Xpert Xpress SARS-CoV-2/FLU/RSV plus assay is intended as an aid in the diagnosis of influenza from Nasopharyngeal swab specimens and should not be used as a sole basis for treatment. Nasal washings and aspirates are unacceptable for Xpert Xpress SARS-CoV-2/FLU/RSV testing.  Fact Sheet for Patients: BloggerCourse.com  Fact Sheet for Healthcare Providers: SeriousBroker.it  This test is not yet approved or cleared by the [place] FDA and has been authorized for detection and/or diagnosis of SARS-CoV-2 by FDA under an Emergency Use Authorization (EUA). This EUA will remain in effect (meaning this test can be used) for the duration of  the COVID-19 declaration under Section 564(b)(1) of the Act, 21 U.S.C. section 360bbb-3(b)(1), unless the authorization is terminated or revoked.     Resp Syncytial Virus by PCR NEGATIVE NEGATIVE Final    Comment: (NOTE) Fact Sheet for Patients: BloggerCourse.com  Fact Sheet for Healthcare Providers: SeriousBroker.it  This test is not yet approved or cleared by the [place] FDA and has been authorized for detection and/or diagnosis of SARS-CoV-2 by FDA under an Emergency Use Authorization (EUA). This EUA will remain in effect (meaning this test can be used) for the duration of the COVID-19 declaration under Section 564(b)(1) of the Act, 21 U.S.C. section 360bbb-3(b)(1), unless the authorization is terminated or revoked.  Performed  at [facility], [address], [place], [place] [address]       Radiology Studies last 3 days: CT Angio Chest Pulmonary Embolism (PE) W or WO Contrast Result Date: 11/27/2023 CLINICAL DATA:  Pulmonary embolism (PE) suspected, high prob Chest pain. EXAM: CT ANGIOGRAPHY CHEST WITH CONTRAST TECHNIQUE: Multidetector CT imaging of the chest was performed using the standard protocol during bolus administration of intravenous contrast. Multiplanar CT image reconstructions and MIPs were obtained to evaluate the vascular anatomy. RADIATION DOSE REDUCTION: This exam was performed according to the departmental dose-optimization program which includes automated exposure control, adjustment of the mA and/or kV according to patient size and/or use of iterative reconstruction technique. CONTRAST:  75mL OMNIPAQUE IOHEXOL 350 MG/ML SOLN COMPARISON:  Radiograph earlier today. Chest CTA 10 days ago 11/17/2023 FINDINGS: Cardiovascular: There are no filling defects within the pulmonary arteries to suggest pulmonary embolus. Aortic atherosclerosis without aneurysm. No periaortic stranding. No contrast in the aorta.  Left atrial occlusion device. The heart is mildly enlarged. Contrast refluxes into the hepatic veins and IVC. No pericardial effusion. Mediastinum/Nodes: 18 mm right hilar node, previously 14 mm. Scattered prominent mediastinal lymph nodes. Again seen 17 mm left thyroid nodule. Decompressed esophagus. Lungs/Pleura: Small to moderate bilateral pleural effusions, increased from prior CT. Associated compressive atelectasis. Improvement but persistent patchy ground-glass opacities primarily in the right lung. No new or progressive airspace disease. There is mild central bronchial thickening Upper Abdomen: Contrast refluxes into the hepatic veins and IVC. Musculoskeletal: Mild thoracic spondylosis. There are no acute or suspicious osseous abnormalities. Review of the MIP images confirms the above findings. IMPRESSION: 1. No pulmonary embolus. 2. Small to moderate bilateral pleural effusions, increased from prior CT. Associated compressive atelectasis. 3. Improvement but persistent patchy ground-glass opacities primarily in the right lung, likely resolving infection. 4. Mild cardiomegaly. Contrast refluxes into the hepatic veins and IVC suggesting right heart dysfunction. 5. Mildly enlarged right hilar lymph node, likely reactive. Aortic Atherosclerosis (ICD10-I70.0). Electronically Signed   By: Narda Rutherford M.D.   On: 11/27/2023 18:26   DG Chest 2 View Result Date: 11/27/2023 CLINICAL DATA:  Chest pain. EXAM: CHEST - 2 VIEW COMPARISON:  11/16/2023.  Chest CTA 11/17/2023. FINDINGS: Lower lung predominant patchy and nodular ill-defined airspace opacity noted. Nodular density/densities projecting over the lungs are compatible with pads for telemetry leads. There is dependent atelectasis with small left pleural effusion. Cardiopericardial silhouette is at upper limits of normal for size. Watchman device overlies the left atrial appendage. Bones are diffusely demineralized. IMPRESSION: Persistent basilar patchy and  nodular airspace disease suspicious for pneumonia. Small left pleural effusion. Electronically Signed   By: Kennith Center M.D.   On: 11/27/2023 12:28          Sunnie Nielsen, DO [facility] 11/29/2023, 1:46 PM    Dictation software may have been used to generate the above note. Typos may occur and escape review in typed/dictated notes. Please contact Dr Lyn Hollingshead directly for clarity if needed.  Staff may message me via secure chat in Epic  but this may not receive an immediate response,  please page me for urgent matters!  If 7PM-7AM, please contact night coverage [email or web address]

## 2023-11-29 NOTE — ED Notes (Signed)
 Pt appears aox4 in nad denies needs speaking in full clear sentences skin appears wdi pink non labored resp noted

## 2023-11-29 NOTE — ED Notes (Signed)
 Pt ambulatory to bathroom without incident

## 2023-11-30 ENCOUNTER — Inpatient Hospital Stay
Admit: 2023-11-30 | Discharge: 2023-11-30 | Disposition: A | Discharge status: Home / Self Care | Attending: Osteopathic Medicine

## 2023-11-30 DIAGNOSIS — I5043 Acute on chronic combined systolic (congestive) and diastolic (congestive) heart failure: Secondary | ICD-10-CM | POA: Diagnosis not present

## 2023-11-30 LAB — BASIC METABOLIC PANEL
Anion gap: 11 (ref 5–15)
BUN: 28 mg/dL — ABNORMAL HIGH (ref 8–23)
CO2: 30 mmol/L (ref 22–32)
Calcium: 8.9 mg/dL (ref 8.9–10.3)
Chloride: 98 mmol/L (ref 98–111)
Creatinine, Ser: 1.15 mg/dL — ABNORMAL HIGH (ref 0.44–1.00)
GFR, Estimated: 50 mL/min — ABNORMAL LOW (ref >60)
Glucose, Bld: 90 mg/dL (ref 70–99)
Potassium: 3.3 mmol/L — ABNORMAL LOW (ref 3.5–5.1)
Sodium: 139 mmol/L (ref 135–145)

## 2023-11-30 LAB — ECHOCARDIOGRAM COMPLETE
AR max vel: 2.83 cm2
AV Area VTI: 2.8 cm2
AV Area mean vel: 2.36 cm2
AV Mean grad: 1 mmHg
AV Peak grad: 2.1 mmHg
Ao pk vel: 0.73 m/s
Area-P 1/2: 4.06 cm2
Height: 65 in
MV VTI: 1.93 cm2
S' Lateral: 3.5 cm
Weight: 2385.6 [oz_av]

## 2023-11-30 MED ORDER — FUROSEMIDE 20 MG PO TABS: 20.0000 mg | ORAL_TABLET | Freq: Two times a day (BID) | ORAL | 0 refills | Status: DC

## 2023-11-30 MED ORDER — METOPROLOL SUCCINATE ER 100 MG PO TB24: 100.0000 mg | ORAL_TABLET | Freq: Every day | ORAL | 0 refills | Status: DC

## 2023-11-30 MED ORDER — POTASSIUM CHLORIDE CRYS ER 20 MEQ PO TBCR
40.0000 meq | EXTENDED_RELEASE_TABLET | Freq: Once | ORAL | Status: AC
  Administered 2023-11-30: 40 meq via ORAL
  Filled 2023-11-30: qty 2

## 2023-11-30 MED ORDER — APIXABAN 5 MG PO TABS: 5.0000 mg | ORAL_TABLET | Freq: Two times a day (BID) | ORAL | 0 refills | Status: DC

## 2023-11-30 NOTE — Progress Notes (Signed)
*  PRELIMINARY RESULTS* Echocardiogram 2D Echocardiogram has been performed.  Carolyne Fiscal 11/30/2023, 10:39 AM

## 2023-11-30 NOTE — Progress Notes (Signed)
 Oxygen saturation at rest on room air: 100% Oxygen saturation with exertion on room air: 95%

## 2023-11-30 NOTE — TOC Initial Note (Signed)
 Transition of Care [facility]) - Initial/Assessment Note    Patient Details  Name: Stephanie [name] MRN: [id number] Date of Birth: 1951/02/23  Transition of Care [facility]) CM/SW Contact:    Truddie Hidden, RN Phone Number: 11/30/2023, 12:24 PM  Clinical Narrative:                 No PCP listed.  Spoke with patient. She stated she was previously with [facility] in [place], but insurance was no longer accepted. She will see her new provider, Dr. Kerin Perna, [facility] April 17          Patient Goals and CMS Choice            Expected Discharge Plan and Services                                              Prior Living Arrangements/Services                       Activities of Daily Living   ADL Screening (condition at time of admission) Independently performs ADLs?: Yes (appropriate for developmental age) Is the patient deaf or have difficulty hearing?: No Does the patient have difficulty seeing, even when wearing glasses/contacts?: No Does the patient have difficulty concentrating, remembering, or making decisions?: No  Permission Sought/Granted                  Emotional Assessment              Admission diagnosis:  Acute on chronic combined systolic and diastolic heart failure (HCC) [I50.43] CHF (congestive heart failure) (HCC) [I50.9] Chest pain on breathing [R07.1] Atrial fibrillation with rapid ventricular response (HCC) [I48.91] Acute congestive heart failure, unspecified heart failure type (HCC) [I50.9] Patient Active Problem List   Diagnosis Date Noted   CHF (congestive heart failure) (HCC) 11/28/2023   Acute on chronic combined systolic and diastolic heart failure (HCC) 11/27/2023   Overweight (BMI 25.0-29.9) 11/27/2023   Paroxysmal atrial fibrillation with RVR (HCC) 08/23/2023   Dyslipidemia 08/23/2023   NSTEMI (non-ST elevated myocardial infarction) (HCC) 08/22/2023   Acute encephalopathy 07/02/2022   QT  prolongation 07/02/2022   Hypokalemia 07/02/2022   Gastroenteritis 07/02/2022   Thyroid nodule 07/02/2022   Acute systolic congestive heart failure (HCC)    Atrial fibrillation with rapid ventricular response (HCC) 04/15/2021   Obesity 04/15/2021   Heel pain 05/25/2018   Chronic diarrhea 01/24/2018   Near syncope 11/16/2017   Medicare annual wellness visit, subsequent 09/07/2017   Screening for hyperlipidemia 09/07/2017   Vitamin D deficiency 09/07/2017   Personal history of tobacco use, presenting hazards to health 10/15/2016   Chronic knee pain 06/08/2016   Medicare annual wellness visit, initial 08/23/2015   Restless leg syndrome 08/23/2015   Essential hypertension 05/24/2015   Anxiety and depression 05/24/2015   Tobacco abuse 05/24/2015   Atrial fibrillation (HCC) 05/24/2015   PCP:  Patient, No Pcp Per Pharmacy:   [id number] Judithann Sheen, [place] - [address] [place] [address] Phone: [phone] Fax: [phone]  [facility] - [place], [place] - [address] [place] [address] Phone: [phone] Fax: [phone]  [facility] - [place], [place] - [address] [place] [address] Phone: [phone] Fax: [phone]     Social Drivers of  Health (SDOH) Social History: SDOH Screenings   Food Insecurity: No Food Insecurity (11/29/2023)  Recent Concern: Food Insecurity - Food Insecurity Present (11/24/2023)   Received from [facility]  Housing: Low Risk  (11/29/2023)  Transportation Needs: No Transportation Needs (11/29/2023)  Utilities: Not At Risk (11/29/2023)  Financial Resource Strain: Medium Risk (11/24/2023)   Received from [facility]  Social Connections: Socially Isolated (11/29/2023)  Tobacco Use: Medium Risk (11/27/2023)   SDOH Interventions:     Readmission Risk Interventions     No data to display

## 2023-11-30 NOTE — Discharge Summary (Signed)
 Physician Discharge Summary   Patient: Stephanie [name] MRN: [id number]  DOB: 03-Dec-1950   Admit:     Date of Admission: 11/27/2023 Admitted from: home   Discharge: Date of discharge: 11/30/23 Disposition: Home Condition at discharge: good  CODE STATUS: FULL CODE     Discharge Physician: Sunnie Nielsen, DO [facility]     PCP: Patient, No Pcp Per  Recommendations for Outpatient Follow-up:  Follow up with cardiology - keep appt in 2 days     Discharge Instructions     (HEART FAILURE PATIENTS) Call MD:  Anytime you have any of the following symptoms: 1) 3 pound weight gain in 24 hours or 5 pounds in 1 week 2) shortness of breath, with or without a dry hacking cough 3) swelling in the hands, feet or stomach 4) if you have to sleep on extra pillows at night in order to breathe.   Complete by: As directed    Diet - low sodium heart healthy   Complete by: As directed    Increase activity slowly   Complete by: As directed          Discharge Diagnoses: Principal Problem:   Acute on chronic combined systolic and diastolic heart failure (HCC) Active Problems:   Atrial fibrillation with rapid ventricular response (HCC)   Essential hypertension   Anxiety and depression   Overweight (BMI 25.0-29.9)   CHF (congestive heart failure) [facility])     Hospital course / significant events:   HPI: Stephanie [name] is a 73 y.o. female with medical history significant of CHF with EF 40-45%, HTN, HLD, depression, A-fib (s/p of Watchman device placement) not on anticoagulants, restless leg syndrome, who presents with SOB and palpitation early AM before coming to the ED mid-day sent by [facility] for Afib RVR.   03/08: in ED, HR 140s --> 103 following metoprolol IV. Admitted to hospitalist. Of note, CTA no PE, small/mod bilateral pleural effusions, R lung appears resolving infection, cardiomegaly w/ contrast refluxing into hepatic veins and IVC suggesting R heart  dysfunction.  03/09: HR improved, still rales, SOB, edema. Continue diuresis.  03/10: good UOP, AKI however, lasix dc, on room air. Informal d/w cardiology, plan for echo today and start eliquis on discharge will try to get the ablation scheduled sooner if possible. Still tachycardic, will see how the day goes w/ high dose metoprolol  03/11: HR improved, ambulating halls on room air, echo about baseline, has f/u w/ cardiology in 2 days, I discussed informally w/ Dr Melton Alar. Pt stable for discharge     Consultants:  none  Procedures/Surgeries: none      ASSESSMENT & PLAN:   Acute on chronic combined systolic and diastolic heart failure  Patient has some shortness breath, leg edema, elevated BNP 1072, positive JVD, crackles on auscultation, clinically consistent with CHF exacerbation.  No fever or leukocytosis, very low suspicions for pneumonia.   Lasix to po and instructions to titrate up as needed  Further medication adjustment outpatient per cardiology  Daily weights Low salt diet Fluid restriction   Atrial fibrillation with rapid ventricular response, RVR resolved Patient is s/p Watchman device placement, not taking anticoagulants currently. Increased home metoprolol 50 mg daily to 100 Cardiology to follow re: ablation - got echo and started eliquis   Pleural effusion bilaterally Continue diuresis Consider repeat imaging but expect will improve w/ time/diuresis   AKI d/t diuresis - resolved Lasix po and lower dose to titrate as directed Monitor BMP  Essential hypertension  Follow w/ cardiology    Anxiety and depression Zoloft              Discharge Instructions  Allergies as of 11/30/2023       Reactions   Codeine Other (See Comments)   Cannot tolerate alone, only when mixed with another medication. Makes patient feel spaced out. Patient has use codeine combined with other medications.   Lactulose Diarrhea   Lactose Intolerance (gi)     Phenazopyridine Hcl Hives        Medication List     STOP taking these medications    amiodarone 200 MG tablet Commonly known as: PACERONE   aspirin EC 81 MG tablet   fluorometholone 0.1 % ophthalmic suspension Commonly known as: FML   losartan 25 MG tablet Commonly known as: COZAAR   oseltamivir 75 MG capsule Commonly known as: TAMIFLU       TAKE these medications    acetaminophen 500 MG tablet Commonly known as: TYLENOL Take 500 mg by mouth every 4 (four) hours as needed.   AeroChamber MV inhaler Use as instructed   albuterol 108 (90 Base) MCG/ACT inhaler Commonly known as: VENTOLIN HFA Inhale 2 puffs into the lungs every 6 (six) hours as needed for wheezing or shortness of breath.   apixaban 5 MG Tabs tablet Commonly known as: ELIQUIS Take 1 tablet (5 mg total) by mouth 2 (two) times daily.   calcium citrate-vitamin D 315-200 MG-UNIT tablet Commonly known as: CITRACAL+D Take 1 tablet by mouth daily.   colestipol 1 g tablet Commonly known as: COLESTID Take 1 g by mouth daily.   furosemide 20 MG tablet Commonly known as: LASIX Take 1 tablet (20 mg total) by mouth 2 (two) times daily. Increase to 1 tablet (20 mg total) by mouth TWICE daily (total daily dose 40 mg) as needed for up to 3 days for increased leg swelling, shortness of breath, weight gain 5+ lbs over 1-2 days. Seek medical care if these symptoms are not improving with increased dose.   metoprolol succinate 100 MG 24 hr tablet Commonly known as: TOPROL-XL Take 1 tablet (100 mg total) by mouth daily. Take with or immediately following a meal. Start taking on: December 01, 2023 What changed:  medication strength how much to take   Optase Comfort Dry Eye 1 % Soln Generic drug: Glycerin (PF) Apply 1 drop to eye 2 (two) times daily as needed.   sertraline 50 MG tablet Commonly known as: ZOLOFT Take 50 mg by mouth daily.   Vevye 0.1 % Soln Generic drug: cycloSPORINE Place 1 drop into both  eyes 2 (two) times daily.   VITAMIN C PO Take 1 tablet by mouth daily.         Follow-up Information     Germaine Pomfret, Eliane Decree, NP. Go in 2 day(s).   Specialty: Nurse Practitioner Why: keep appointment Contact information: [address] [place] [address] [phone]                 Allergies  Allergen Reactions   Codeine Other (See Comments)    Cannot tolerate alone, only when mixed with another medication. Makes patient feel spaced out. Patient has use codeine combined with other medications.   Lactulose Diarrhea   Lactose Intolerance (Gi)    Phenazopyridine Hcl Hives     Subjective: pt reports breathign better, still intermittent chest tightness which is similar to what she experiences at home / prior to hospitalization. Ambulating well.   Discharge  Exam: BP 100/77 (BP Location: Left Arm)   Pulse 81   Temp 98.4 F (36.9 C)   Resp 20   Ht 5\' 5"  (1.651 m)   Wt 67.6 kg   SpO2 91%   BMI 24.81 kg/m  General: Pt is alert, awake, not in acute distress Cardiovascular: RRR, S1/S2 +, no rubs, no gallops Respiratory: faint rales at bilateral bases, no wheezing, no rhonchi Abdominal: Soft, NT, ND, bowel sounds + Extremities: no edema, no cyanosis     The results of significant diagnostics from this hospitalization (including imaging, microbiology, ancillary and laboratory) are listed below for reference.     Microbiology: Recent Results (from the past 240 hours)  Resp panel by RT-PCR (RSV, Flu A&B, Covid) Anterior Nasal Swab     Status: None   Collection Time: 11/27/23  2:45 PM   Specimen: Anterior Nasal Swab  Result Value Ref Range Status   SARS Coronavirus 2 by RT PCR NEGATIVE NEGATIVE Final    Comment: (NOTE) SARS-CoV-2 target nucleic acids are NOT DETECTED.  The SARS-CoV-2 RNA is generally detectable in upper respiratory specimens during the acute phase of infection. The lowest concentration of SARS-CoV-2 viral copies this assay  can detect is 138 copies/mL. A negative result does not preclude SARS-Cov-2 infection and should not be used as the sole basis for treatment or other patient management decisions. A negative result may occur with  improper specimen collection/handling, submission of specimen other than nasopharyngeal swab, presence of viral mutation(s) within the areas targeted by this assay, and inadequate number of viral copies(<138 copies/mL). A negative result must be combined with clinical observations, patient history, and epidemiological information. The expected result is Negative.  Fact Sheet for Patients:  BloggerCourse.com  Fact Sheet for Healthcare Providers:  SeriousBroker.it  This test is no t yet approved or cleared by the [place] FDA and  has been authorized for detection and/or diagnosis of SARS-CoV-2 by FDA under an Emergency Use Authorization (EUA). This EUA will remain  in effect (meaning this test can be used) for the duration of the COVID-19 declaration under Section 564(b)(1) of the Act, 21 U.S.C.section 360bbb-3(b)(1), unless the authorization is terminated  or revoked sooner.       Influenza A by PCR NEGATIVE NEGATIVE Final   Influenza B by PCR NEGATIVE NEGATIVE Final    Comment: (NOTE) The Xpert Xpress SARS-CoV-2/FLU/RSV plus assay is intended as an aid in the diagnosis of influenza from Nasopharyngeal swab specimens and should not be used as a sole basis for treatment. Nasal washings and aspirates are unacceptable for Xpert Xpress SARS-CoV-2/FLU/RSV testing.  Fact Sheet for Patients: BloggerCourse.com  Fact Sheet for Healthcare Providers: SeriousBroker.it  This test is not yet approved or cleared by the [place] FDA and has been authorized for detection and/or diagnosis of SARS-CoV-2 by FDA under an Emergency Use Authorization (EUA). This EUA will  remain in effect (meaning this test can be used) for the duration of the COVID-19 declaration under Section 564(b)(1) of the Act, 21 U.S.C. section 360bbb-3(b)(1), unless the authorization is terminated or revoked.     Resp Syncytial Virus by PCR NEGATIVE NEGATIVE Final    Comment: (NOTE) Fact Sheet for Patients: BloggerCourse.com  Fact Sheet for Healthcare Providers: SeriousBroker.it  This test is not yet approved or cleared by the [place] FDA and has been authorized for detection and/or diagnosis of SARS-CoV-2 by FDA under an Emergency Use Authorization (EUA). This EUA will remain in effect (meaning this test can be used)  for the duration of the COVID-19 declaration under Section 564(b)(1) of the Act, 21 U.S.C. section 360bbb-3(b)(1), unless the authorization is terminated or revoked.  Performed at [facility], [address] [place], [place] [address]      Labs: BNP (last 3 results) Recent Labs    09/29/23 1408 11/11/23 1156 11/27/23 1149  BNP 350.4* 941.9* 1,072.2*   Basic Metabolic Panel: Recent Labs  Lab 11/27/23 1149 11/28/23 0447 11/29/23 0313 11/30/23 0406  NA 137 141 139 139  K 3.9 3.4* 3.9 3.3*  CL 109 105 96* 98  CO2 23 29 31 30   GLUCOSE 115* 96 104* 90  BUN 20 17 24* 28*  CREATININE 0.95 0.98 1.43* 1.15*  CALCIUM 8.4* 8.1* 8.9 8.9  MG  --  2.2  --   --    Liver Function Tests: No results for input(s): "AST", "ALT", "ALKPHOS", "BILITOT", "PROT", "ALBUMIN" in the last 168 hours. No results for input(s): "LIPASE", "AMYLASE" in the last 168 hours. No results for input(s): "AMMONIA" in the last 168 hours. CBC: Recent Labs  Lab 11/27/23 1149 11/29/23 0313  WBC 8.0 7.6  HGB 13.3 14.0  HCT 39.9 41.7  MCV 95.7 94.3  PLT 218 201   Cardiac Enzymes: No results for input(s): "CKTOTAL", "CKMB", "CKMBINDEX", "TROPONINI" in the last 168 hours. BNP: Invalid input(s):  "POCBNP" CBG: No results for input(s): "GLUCAP" in the last 168 hours. D-Dimer No results for input(s): "DDIMER" in the last 72 hours. Hgb A1c No results for input(s): "HGBA1C" in the last 72 hours. Lipid Profile No results for input(s): "CHOL", "HDL", "LDLCALC", "TRIG", "CHOLHDL", "LDLDIRECT" in the last 72 hours. Thyroid function studies No results for input(s): "TSH", "T4TOTAL", "T3FREE", "THYROIDAB" in the last 72 hours.  Invalid input(s): "FREET3" Anemia work up No results for input(s): "VITAMINB12", "FOLATE", "FERRITIN", "TIBC", "IRON", "RETICCTPCT" in the last 72 hours. Urinalysis    Component Value Date/Time   COLORURINE YELLOW 11/11/2023 1500   APPEARANCEUR CLEAR 11/11/2023 1500   LABSPEC 1.008 11/11/2023 1500   PHURINE 6.0 11/11/2023 1500   GLUCOSEU NEGATIVE 11/11/2023 1500   HGBUR NEGATIVE 11/11/2023 1500   BILIRUBINUR NEGATIVE 11/11/2023 1500   KETONESUR NEGATIVE 11/11/2023 1500   PROTEINUR NEGATIVE 11/11/2023 1500   NITRITE NEGATIVE 11/11/2023 1500   LEUKOCYTESUR NEGATIVE 11/11/2023 1500   Sepsis Labs Recent Labs  Lab 11/27/23 1149 11/29/23 0313  WBC 8.0 7.6   Microbiology Recent Results (from the past 240 hours)  Resp panel by RT-PCR (RSV, Flu A&B, Covid) Anterior Nasal Swab     Status: None   Collection Time: 11/27/23  2:45 PM   Specimen: Anterior Nasal Swab  Result Value Ref Range Status   SARS Coronavirus 2 by RT PCR NEGATIVE NEGATIVE Final    Comment: (NOTE) SARS-CoV-2 target nucleic acids are NOT DETECTED.  The SARS-CoV-2 RNA is generally detectable in upper respiratory specimens during the acute phase of infection. The lowest concentration of SARS-CoV-2 viral copies this assay can detect is 138 copies/mL. A negative result does not preclude SARS-Cov-2 infection and should not be used as the sole basis for treatment or other patient management decisions. A negative result may occur with  improper specimen collection/handling, submission of  specimen other than nasopharyngeal swab, presence of viral mutation(s) within the areas targeted by this assay, and inadequate number of viral copies(<138 copies/mL). A negative result must be combined with clinical observations, patient history, and epidemiological information. The expected result is Negative.  Fact Sheet for Patients:  BloggerCourse.com  Fact  Sheet for Healthcare Providers:  SeriousBroker.it  This test is no t yet approved or cleared by the [place] FDA and  has been authorized for detection and/or diagnosis of SARS-CoV-2 by FDA under an Emergency Use Authorization (EUA). This EUA will remain  in effect (meaning this test can be used) for the duration of the COVID-19 declaration under Section 564(b)(1) of the Act, 21 U.S.C.section 360bbb-3(b)(1), unless the authorization is terminated  or revoked sooner.       Influenza A by PCR NEGATIVE NEGATIVE Final   Influenza B by PCR NEGATIVE NEGATIVE Final    Comment: (NOTE) The Xpert Xpress SARS-CoV-2/FLU/RSV plus assay is intended as an aid in the diagnosis of influenza from Nasopharyngeal swab specimens and should not be used as a sole basis for treatment. Nasal washings and aspirates are unacceptable for Xpert Xpress SARS-CoV-2/FLU/RSV testing.  Fact Sheet for Patients: BloggerCourse.com  Fact Sheet for Healthcare Providers: SeriousBroker.it  This test is not yet approved or cleared by the [place] FDA and has been authorized for detection and/or diagnosis of SARS-CoV-2 by FDA under an Emergency Use Authorization (EUA). This EUA will remain in effect (meaning this test can be used) for the duration of the COVID-19 declaration under Section 564(b)(1) of the Act, 21 U.S.C. section 360bbb-3(b)(1), unless the authorization is terminated or revoked.     Resp Syncytial Virus by PCR NEGATIVE NEGATIVE Final     Comment: (NOTE) Fact Sheet for Patients: BloggerCourse.com  Fact Sheet for Healthcare Providers: SeriousBroker.it  This test is not yet approved or cleared by the [place] FDA and has been authorized for detection and/or diagnosis of SARS-CoV-2 by FDA under an Emergency Use Authorization (EUA). This EUA will remain in effect (meaning this test can be used) for the duration of the COVID-19 declaration under Section 564(b)(1) of the Act, 21 U.S.C. section 360bbb-3(b)(1), unless the authorization is terminated or revoked.  Performed at [facility], [address] [place], [place] [address]    Imaging ECHOCARDIOGRAM COMPLETE Result Date: 11/30/2023    ECHOCARDIOGRAM REPORT   Patient Name:   Stephanie [name] Date of Exam: 11/30/2023 Medical Rec #:  [id number]      Height:       65.0 in Accession #:    [id number]     Weight:       149.1 lb Date of Birth:  Mar 06, 1951      BSA:          1.746 m Patient Age:    72 years       BP:           104/78 mmHg Patient Gender: F              HR:           100 bpm. Exam Location:  [facility] Procedure: 2D Echo, Cardiac Doppler, Color Doppler and 3D Echo (Both Spectral            and Color Flow Doppler were utilized during procedure). Indications:     Atrial Fibrillation  History:         Patient has prior history of Echocardiogram examinations, most                  recent 08/23/2023. CHF, Previous Myocardial Infarction, TIA,                  Arrythmias:Atrial Fibrillation, Signs/Symptoms:Syncope; Risk  Factors:Hypertension, Dyslipidemia and Current Smoker.  Sonographer:     Mikki Harbor Referring Phys:  [id number] Sunnie Nielsen Diagnosing Phys: Rozell Searing Custovic IMPRESSIONS  1. Left ventricular ejection fraction, by estimation, is 30 to 35%. The left ventricle has moderately decreased function. The left ventricle demonstrates global hypokinesis. Left ventricular diastolic parameters are  indeterminate.  2. Right ventricular systolic function is mildly reduced. The right ventricular size is normal. There is normal pulmonary artery systolic pressure. The estimated right ventricular systolic pressure is 31.5 mmHg.  3. Left atrial size was severely dilated.  4. Right atrial size was mildly dilated.  5. The mitral valve is normal in structure. Moderate mitral valve regurgitation. No evidence of mitral stenosis.  6. Tricuspid valve regurgitation is moderate.  7. The aortic valve is normal in structure. Aortic valve regurgitation is not visualized. No aortic stenosis is present.  8. The inferior vena cava is normal in size with greater than 50% respiratory variability, suggesting right atrial pressure of 3 mmHg. FINDINGS  Left Ventricle: Left ventricular ejection fraction, by estimation, is 30 to 35%. The left ventricle has moderately decreased function. The left ventricle demonstrates global hypokinesis. The left ventricular internal cavity size was normal in size. There is no left ventricular hypertrophy. Left ventricular diastolic parameters are indeterminate. Right Ventricle: The right ventricular size is normal. No increase in right ventricular wall thickness. Right ventricular systolic function is mildly reduced. There is normal pulmonary artery systolic pressure. The tricuspid regurgitant velocity is 2.67 m/s, and with an assumed right atrial pressure of 3 mmHg, the estimated right ventricular systolic pressure is 31.5 mmHg. Left Atrium: Left atrial size was severely dilated. Right Atrium: Right atrial size was mildly dilated. Pericardium: There is no evidence of pericardial effusion. Mitral Valve: The mitral valve is normal in structure. Moderate mitral valve regurgitation. No evidence of mitral valve stenosis. MV peak gradient, 2.6 mmHg. The mean mitral valve gradient is 1.0 mmHg. Tricuspid Valve: The tricuspid valve is normal in structure. Tricuspid valve regurgitation is moderate. Aortic Valve:  The aortic valve is normal in structure. Aortic valve regurgitation is not visualized. No aortic stenosis is present. Aortic valve mean gradient measures 1.0 mmHg. Aortic valve peak gradient measures 2.1 mmHg. Aortic valve area, by VTI measures 2.80 cm. Pulmonic Valve: The pulmonic valve was normal in structure. Pulmonic valve regurgitation is not visualized. Aorta: The aortic root is normal in size and structure. Venous: The inferior vena cava is normal in size with greater than 50% respiratory variability, suggesting right atrial pressure of 3 mmHg. IAS/Shunts: No atrial level shunt detected by color flow Doppler.  LEFT VENTRICLE PLAX 2D LVIDd:         4.30 cm LVIDs:         3.50 cm LV PW:         1.30 cm LV IVS:        1.30 cm LVOT diam:     1.90 cm LV SV:         40 LV SV Index:   23 LVOT Area:     2.84 cm  RIGHT VENTRICLE RV Basal diam:  3.45 cm RV Mid diam:    2.90 cm RV S prime:     7.62 cm/s LEFT ATRIUM             Index        RIGHT ATRIUM           Index LA diam:        3.40 cm  1.95 cm/m   RA Area:     20.30 cm LA Vol (A2C):   52.2 ml 29.90 ml/m  RA Volume:   60.60 ml  34.71 ml/m LA Vol (A4C):   85.0 ml 48.68 ml/m LA Biplane Vol: 66.8 ml 38.26 ml/m  AORTIC VALVE                    PULMONIC VALVE AV Area (Vmax):    2.83 cm     PV Vmax:       0.55 m/s AV Area (Vmean):   2.36 cm     PV Peak grad:  1.2 mmHg AV Area (VTI):     2.80 cm AV Vmax:           72.93 cm/s AV Vmean:          50.367 cm/s AV VTI:            0.143 m AV Peak Grad:      2.1 mmHg AV Mean Grad:      1.0 mmHg LVOT Vmax:         72.90 cm/s LVOT Vmean:        42.000 cm/s LVOT VTI:          0.141 m LVOT/AV VTI ratio: 0.99  AORTA Ao Root diam: 3.20 cm Ao Asc diam:  3.20 cm MITRAL VALVE               TRICUSPID VALVE MV Area (PHT): 4.06 cm    TR Peak grad:   28.5 mmHg MV Area VTI:   1.93 cm    TR Vmax:        267.00 cm/s MV Peak grad:  2.6 mmHg MV Mean grad:  1.0 mmHg    SHUNTS MV Vmax:       0.81 m/s    Systemic VTI:  0.14 m MV Vmean:       44.3 cm/s   Systemic Diam: 1.90 cm MV Decel Time: 187 msec MV E velocity: 80.60 cm/s Rozell Searing Custovic Electronically signed by Clotilde Dieter Signature Date/Time: 11/30/2023/3:20:37 PM    Final       Time coordinating discharge: over 30 minutes  SIGNED:  Sunnie Nielsen DO [facility]

## 2023-11-30 NOTE — Progress Notes (Signed)
*  PRELIMINARY RESULTS* Echocardiogram 2D Echocardiogram has been performed.  Carolyne Fiscal 11/30/2023, 10:38 AM

## 2023-11-30 NOTE — Plan of Care (Signed)
?  Problem: Education: ?Goal: Ability to verbalize understanding of medication therapies will improve ?Outcome: Progressing ?  ?Problem: Activity: ?Goal: Capacity to carry out activities will improve ?Outcome: Progressing ?  ?Problem: Cardiac: ?Goal: Ability to achieve and maintain adequate cardiopulmonary perfusion will improve ?Outcome: Progressing ?  ?

## 2023-11-30 NOTE — Progress Notes (Signed)
 This RN provided discharge instructions and teaching to the patient. The patient verbalized and demonstrated understanding of the provided instructions. All outstanding questions resolved. Bilateral PIVs removed. Both cannulas intact. Pt tolerated well. All belongings packed and in tow.

## 2023-12-02 DIAGNOSIS — R0789 Other chest pain: Secondary | ICD-10-CM | POA: Diagnosis not present

## 2023-12-02 DIAGNOSIS — Z720 Tobacco use: Secondary | ICD-10-CM | POA: Diagnosis not present

## 2023-12-02 DIAGNOSIS — E782 Mixed hyperlipidemia: Secondary | ICD-10-CM | POA: Diagnosis not present

## 2023-12-02 DIAGNOSIS — I5023 Acute on chronic systolic (congestive) heart failure: Secondary | ICD-10-CM | POA: Diagnosis not present

## 2023-12-02 DIAGNOSIS — R0602 Shortness of breath: Secondary | ICD-10-CM | POA: Diagnosis not present

## 2023-12-02 DIAGNOSIS — I10 Essential (primary) hypertension: Secondary | ICD-10-CM | POA: Diagnosis not present

## 2023-12-02 DIAGNOSIS — I480 Paroxysmal atrial fibrillation: Secondary | ICD-10-CM | POA: Diagnosis not present

## 2023-12-02 DIAGNOSIS — E785 Hyperlipidemia, unspecified: Secondary | ICD-10-CM | POA: Diagnosis not present

## 2023-12-02 DIAGNOSIS — I214 Non-ST elevation (NSTEMI) myocardial infarction: Secondary | ICD-10-CM | POA: Diagnosis not present

## 2023-12-02 DIAGNOSIS — R9431 Abnormal electrocardiogram [ECG] [EKG]: Secondary | ICD-10-CM | POA: Diagnosis not present

## 2023-12-05 MED ORDER — SODIUM CHLORIDE 0.9 % IV SOLN: INTRAVENOUS | Status: DC

## 2023-12-06 ENCOUNTER — Ambulatory Visit: Admitting: Anesthesiology

## 2023-12-06 ENCOUNTER — Ambulatory Visit
Admission: RE | Admit: 2023-12-06 | Discharge: 2023-12-06 | Disposition: A | Discharge status: Home / Self Care | Attending: Internal Medicine | Admitting: Internal Medicine

## 2023-12-06 ENCOUNTER — Other Ambulatory Visit: Payer: Self-pay

## 2023-12-06 ENCOUNTER — Encounter: Payer: Self-pay | Admitting: Internal Medicine

## 2023-12-06 ENCOUNTER — Encounter
Admission: RE | Disposition: A | Discharge status: Home / Self Care | Payer: Self-pay | Source: Home / Self Care | Attending: Internal Medicine

## 2023-12-06 DIAGNOSIS — Z7901 Long term (current) use of anticoagulants: Secondary | ICD-10-CM | POA: Diagnosis not present

## 2023-12-06 DIAGNOSIS — I4819 Other persistent atrial fibrillation: Secondary | ICD-10-CM

## 2023-12-06 DIAGNOSIS — I509 Heart failure, unspecified: Secondary | ICD-10-CM | POA: Diagnosis not present

## 2023-12-06 DIAGNOSIS — I498 Other specified cardiac arrhythmias: Secondary | ICD-10-CM | POA: Diagnosis not present

## 2023-12-06 DIAGNOSIS — I480 Paroxysmal atrial fibrillation: Secondary | ICD-10-CM | POA: Diagnosis not present

## 2023-12-06 DIAGNOSIS — G4730 Sleep apnea, unspecified: Secondary | ICD-10-CM | POA: Diagnosis not present

## 2023-12-06 DIAGNOSIS — J449 Chronic obstructive pulmonary disease, unspecified: Secondary | ICD-10-CM | POA: Diagnosis not present

## 2023-12-06 DIAGNOSIS — I4892 Unspecified atrial flutter: Secondary | ICD-10-CM | POA: Diagnosis not present

## 2023-12-06 DIAGNOSIS — Z87891 Personal history of nicotine dependence: Secondary | ICD-10-CM | POA: Diagnosis not present

## 2023-12-06 DIAGNOSIS — I4891 Unspecified atrial fibrillation: Secondary | ICD-10-CM | POA: Diagnosis not present

## 2023-12-06 DIAGNOSIS — E785 Hyperlipidemia, unspecified: Secondary | ICD-10-CM | POA: Diagnosis not present

## 2023-12-06 DIAGNOSIS — I252 Old myocardial infarction: Secondary | ICD-10-CM | POA: Insufficient documentation

## 2023-12-06 DIAGNOSIS — I110 Hypertensive heart disease with heart failure: Secondary | ICD-10-CM | POA: Diagnosis not present

## 2023-12-06 HISTORY — PX: CARDIOVERSION: SHX1299

## 2023-12-06 SURGERY — CARDIOVERSION
Anesthesia: General

## 2023-12-06 MED ORDER — PROPOFOL 10 MG/ML IV BOLUS
INTRAVENOUS | Status: DC | PRN
  Administered 2023-12-06: 10 mg via INTRAVENOUS
  Administered 2023-12-06: 50 mg via INTRAVENOUS

## 2023-12-06 MED ORDER — LIDOCAINE HCL (CARDIAC) PF 100 MG/5ML IV SOSY
PREFILLED_SYRINGE | INTRAVENOUS | Status: DC | PRN
  Administered 2023-12-06: 60 mg via INTRATRACHEAL

## 2023-12-06 NOTE — Transfer of Care (Signed)
 Immediate Anesthesia Transfer of Care Note  Patient: Stephanie [name]  Procedure(s) Performed: CARDIOVERSION  Patient Location:  [facility]   Anesthesia Type:General  Level of Consciousness: awake, drowsy, and patient cooperative  Airway & Oxygen Therapy: Patient Spontanous Breathing and Patient connected to face mask oxygen  Post-op Assessment: Report given to RN and Post -op Vital signs reviewed and stable  Post vital signs: Reviewed and stable  Last Vitals:  Vitals Value Taken Time  BP 104/91 12/06/23 0739  Temp    Pulse 72 12/06/23 0741  Resp 19 12/06/23 0741  SpO2 100 % 12/06/23 0741    Last Pain:  Vitals:   12/06/23 0659  TempSrc: Oral  PainSc: 0-No pain         Complications: No notable events documented.

## 2023-12-06 NOTE — Anesthesia Preprocedure Evaluation (Addendum)
 Anesthesia Evaluation  Patient identified by MRN, date of birth, ID band Patient awake    Reviewed: Allergy & Precautions, NPO status , Patient's Chart, lab work & pertinent test results  History of Anesthesia Complications Negative for: history of anesthetic complications  Airway Mallampati: III   Neck ROM: Full    Dental  (+) Missing   Pulmonary sleep apnea and Continuous Positive Airway Pressure Ventilation , COPD, former smoker (quit 10/2023)   Pulmonary exam normal breath sounds clear to auscultation       Cardiovascular hypertension, + Past MI (NSTEMI 08/2023) and +CHF  + dysrhythmias (a fib s/p Watchman on Eliquis)  Rhythm:Irregular Rate:Normal  Echo 11/02/23:  1. Left ventricular ejection fraction, by estimation, is 30 to 35%. The left ventricle has moderately decreased function. The left ventricle demonstrates global hypokinesis. Left ventricular diastolic parameters are indeterminate.   2. Right ventricular systolic function is mildly reduced. The right ventricular size is normal. There is normal pulmonary artery systolic pressure. The estimated right ventricular systolic pressure is 31.5 mmHg.   3. Left atrial size was severely dilated.   4. Right atrial size was mildly dilated.   5. The mitral valve is normal in structure. Moderate mitral valve regurgitation. No evidence of mitral stenosis.   6. Tricuspid valve regurgitation is moderate.   7. The aortic valve is normal in structure. Aortic valve regurgitation is not visualized. No aortic stenosis is present.   8. The inferior vena cava is normal in size with greater than 50% respiratory variability, suggesting right atrial pressure of 3 mmHg.    Myocardial perfusion 01/20/22:   Mildly abnormal myocardial perfusion scan evidence of apical defect with questionable redistribution overall ejection fraction 53% with overall normal wall motion conclusion this is a intermediate risk  scan consider invasive evaluation depending on patient's symptoms     Neuro/Psych  PSYCHIATRIC DISORDERS Anxiety Depression    TIA (2024)   GI/Hepatic negative GI ROS,,,  Endo/Other  negative endocrine ROS    Renal/GU negative Renal ROS     Musculoskeletal  (+) Arthritis ,    Abdominal   Peds  Hematology negative hematology ROS (+)   Anesthesia Other Findings   Reproductive/Obstetrics                             Anesthesia Physical Anesthesia Plan  ASA: 3  Anesthesia Plan: General   Post-op Pain Management:    Induction: Intravenous  PONV Risk Score and Plan: 3 and Propofol infusion, TIVA and Treatment may vary due to age or medical condition  Airway Management Planned: Natural Airway  Additional Equipment:   Intra-op Plan:   Post-operative Plan:   Informed Consent: I have reviewed the patients History and Physical, chart, labs and discussed the procedure including the risks, benefits and alternatives for the proposed anesthesia with the patient or authorized representative who has indicated his/her understanding and acceptance.       Plan Discussed with: CRNA  Anesthesia Plan Comments: (LMA/GETA backup discussed.  Patient consented for risks of anesthesia including but not limited to:  - adverse reactions to medications - damage to eyes, teeth, lips or other oral mucosa - nerve damage due to positioning  - sore throat or hoarseness - damage to heart, brain, nerves, lungs, other parts of body or loss of life  Informed patient about role of CRNA in peri- and intra-operative care.  Patient voiced understanding.)  Anesthesia Quick Evaluation

## 2023-12-06 NOTE — CV Procedure (Signed)
 Electrical Cardioversion Procedure Note   Procedure: Electrical Cardioversion Indications:  Atrial Fibrillation  Procedure Details Consent: Risks of procedure as well as the alternatives and risks of each were explained to the (patient/caregiver).  Consent for procedure obtained. Time Out: Verified patient identification, verified procedure, site/side was marked, verified correct patient position, special equipment/implants available, medications/allergies/relevent history reviewed, required imaging and test results available.  Performed  Patient placed on cardiac monitor, pulse oximetry, supplemental oxygen as necessary.  Sedation given:    propofol as per anesthesia Pacer pads placed anterior and posterior chest.  Cardioverted 1 time(s).  Cardioverted at 200J.  Evaluation Findings: Post procedure EKG shows: NSR Complications: None Patient did tolerate procedure well.   Dorothyann Peng MD 12/06/2023

## 2023-12-06 NOTE — Anesthesia Postprocedure Evaluation (Signed)
 Anesthesia Post Note  Patient: Stephanie [name]  Procedure(s) Performed: CARDIOVERSION  Patient location during evaluation: PACU Anesthesia Type: General Level of consciousness: awake and alert, oriented and patient cooperative Pain management: pain level controlled Vital Signs Assessment: post-procedure vital signs reviewed and stable Respiratory status: spontaneous breathing, nonlabored ventilation and respiratory function stable Cardiovascular status: blood pressure returned to baseline and stable Postop Assessment: adequate PO intake Anesthetic complications: no   No notable events documented.   Last Vitals:  Vitals:   12/06/23 0800 12/06/23 0815  BP: 99/76 114/70  Pulse: 63 65  Resp: 16 20  Temp:    SpO2: 99% 99%    Last Pain:  Vitals:   12/06/23 0659  TempSrc: Oral  PainSc: 0-No pain                 Reed Breech

## 2023-12-06 NOTE — Anesthesia Procedure Notes (Signed)
 Procedure Name: General with mask airway Date/Time: 12/06/2023 7:30 AM  Performed by: Mohammed Kindle, CRNAPre-anesthesia Checklist: Patient identified, Emergency Drugs available, Suction available and Patient being monitored Patient Re-evaluated:Patient Re-evaluated prior to induction Oxygen Delivery Method: Simple face mask Induction Type: IV induction Placement Confirmation: positive ETCO2 and breath sounds checked- equal and bilateral Dental Injury: Teeth and Oropharynx as per pre-operative assessment

## 2023-12-07 ENCOUNTER — Encounter: Payer: Self-pay | Admitting: Internal Medicine

## 2023-12-20 DIAGNOSIS — H524 Presbyopia: Secondary | ICD-10-CM | POA: Diagnosis not present

## 2023-12-20 DIAGNOSIS — H57813 Brow ptosis, bilateral: Secondary | ICD-10-CM | POA: Diagnosis not present

## 2023-12-20 DIAGNOSIS — H0288A Meibomian gland dysfunction right eye, upper and lower eyelids: Secondary | ICD-10-CM | POA: Diagnosis not present

## 2023-12-20 DIAGNOSIS — L718 Other rosacea: Secondary | ICD-10-CM | POA: Diagnosis not present

## 2023-12-20 DIAGNOSIS — H0288B Meibomian gland dysfunction left eye, upper and lower eyelids: Secondary | ICD-10-CM | POA: Diagnosis not present

## 2023-12-20 DIAGNOSIS — H02839 Dermatochalasis of unspecified eye, unspecified eyelid: Secondary | ICD-10-CM | POA: Diagnosis not present

## 2023-12-20 DIAGNOSIS — H43812 Vitreous degeneration, left eye: Secondary | ICD-10-CM | POA: Diagnosis not present

## 2023-12-20 DIAGNOSIS — H16223 Keratoconjunctivitis sicca, not specified as Sjogren's, bilateral: Secondary | ICD-10-CM | POA: Diagnosis not present

## 2023-12-21 DIAGNOSIS — R152 Fecal urgency: Secondary | ICD-10-CM | POA: Diagnosis not present

## 2023-12-21 DIAGNOSIS — K591 Functional diarrhea: Secondary | ICD-10-CM | POA: Diagnosis not present

## 2023-12-21 DIAGNOSIS — Z9049 Acquired absence of other specified parts of digestive tract: Secondary | ICD-10-CM | POA: Diagnosis not present

## 2024-01-04 DIAGNOSIS — I5021 Acute systolic (congestive) heart failure: Secondary | ICD-10-CM | POA: Diagnosis not present

## 2024-01-04 DIAGNOSIS — J90 Pleural effusion, not elsewhere classified: Secondary | ICD-10-CM | POA: Diagnosis not present

## 2024-01-04 DIAGNOSIS — I480 Paroxysmal atrial fibrillation: Secondary | ICD-10-CM | POA: Diagnosis not present

## 2024-01-04 DIAGNOSIS — E079 Disorder of thyroid, unspecified: Secondary | ICD-10-CM | POA: Diagnosis not present

## 2024-01-05 ENCOUNTER — Encounter: Payer: Self-pay | Admitting: Family Medicine

## 2024-01-05 DIAGNOSIS — I252 Old myocardial infarction: Secondary | ICD-10-CM | POA: Insufficient documentation

## 2024-01-05 DIAGNOSIS — I5020 Unspecified systolic (congestive) heart failure: Secondary | ICD-10-CM | POA: Diagnosis not present

## 2024-01-05 DIAGNOSIS — Z9989 Dependence on other enabling machines and devices: Secondary | ICD-10-CM | POA: Diagnosis not present

## 2024-01-05 DIAGNOSIS — I4819 Other persistent atrial fibrillation: Secondary | ICD-10-CM | POA: Diagnosis not present

## 2024-01-05 DIAGNOSIS — I480 Paroxysmal atrial fibrillation: Secondary | ICD-10-CM | POA: Diagnosis not present

## 2024-01-05 DIAGNOSIS — J449 Chronic obstructive pulmonary disease, unspecified: Secondary | ICD-10-CM | POA: Diagnosis not present

## 2024-01-05 DIAGNOSIS — I110 Hypertensive heart disease with heart failure: Secondary | ICD-10-CM | POA: Diagnosis not present

## 2024-01-05 DIAGNOSIS — G4733 Obstructive sleep apnea (adult) (pediatric): Secondary | ICD-10-CM | POA: Diagnosis not present

## 2024-01-05 HISTORY — DX: Old myocardial infarction: I25.2

## 2024-01-05 NOTE — Progress Notes (Deleted)
     Esra Frankowski T. Laurena Valko, MD, CAQ Sports Medicine [facility] at [facility] [address] [place], [address]  Phone: [phone]  FAX: [phone]  Rumaisa Schnetzer - 73 y.o. female  MRN [id number]  Date of Birth: 1951-03-18  Date: 01/06/2024  PCP: Patient, No Pcp Per  Referral: Tawnya Fava, Consuelo *  No chief complaint on file.  Subjective:   Aliahna Statzer is a 73 y.o. very pleasant female patient with There is no height or weight on file to calculate BMI. who presents with the following:  This is a complicated medical patient.  She has been admitted 5 times in the last 5 months, she presents to me as a new patient evaluation.  She previously saw Aneta Bar, and it looks as if she has been recently seeing [place].  She was recently admitted a couple weeks ago with persistent A-fib and was treated at [facility] with cardioversion.  History is significant for history of NSTEMI, chronic systolic and diastolic heart failure, paroxysmal A-fib.  She also has a history of hypertension, hyperlipidemia, and tobacco abuse.    Review of Systems is noted in the HPI, as appropriate  Objective:   There were no vitals taken for this visit.  GEN: No acute distress; alert,appropriate. PULM: Breathing comfortably in no respiratory distress PSYCH: Normally interactive.   Laboratory and Imaging Data:  Assessment and Plan:   ***

## 2024-01-06 ENCOUNTER — Ambulatory Visit: Payer: HMO | Admitting: Family Medicine

## 2024-01-06 DIAGNOSIS — Z885 Allergy status to narcotic agent status: Secondary | ICD-10-CM | POA: Diagnosis not present

## 2024-01-06 DIAGNOSIS — I4819 Other persistent atrial fibrillation: Secondary | ICD-10-CM | POA: Diagnosis not present

## 2024-01-06 DIAGNOSIS — I10 Essential (primary) hypertension: Secondary | ICD-10-CM

## 2024-01-06 DIAGNOSIS — Z7901 Long term (current) use of anticoagulants: Secondary | ICD-10-CM | POA: Diagnosis not present

## 2024-01-06 DIAGNOSIS — I480 Paroxysmal atrial fibrillation: Secondary | ICD-10-CM

## 2024-01-06 DIAGNOSIS — I5022 Chronic systolic (congestive) heart failure: Secondary | ICD-10-CM | POA: Diagnosis not present

## 2024-01-06 DIAGNOSIS — Z87891 Personal history of nicotine dependence: Secondary | ICD-10-CM | POA: Diagnosis not present

## 2024-01-06 DIAGNOSIS — I110 Hypertensive heart disease with heart failure: Secondary | ICD-10-CM | POA: Diagnosis not present

## 2024-01-06 DIAGNOSIS — E782 Mixed hyperlipidemia: Secondary | ICD-10-CM

## 2024-01-06 DIAGNOSIS — Z888 Allergy status to other drugs, medicaments and biological substances status: Secondary | ICD-10-CM | POA: Diagnosis not present

## 2024-01-06 DIAGNOSIS — G4733 Obstructive sleep apnea (adult) (pediatric): Secondary | ICD-10-CM | POA: Diagnosis not present

## 2024-01-06 DIAGNOSIS — I5042 Chronic combined systolic (congestive) and diastolic (congestive) heart failure: Secondary | ICD-10-CM

## 2024-01-06 DIAGNOSIS — J449 Chronic obstructive pulmonary disease, unspecified: Secondary | ICD-10-CM | POA: Diagnosis not present

## 2024-01-06 DIAGNOSIS — I2510 Atherosclerotic heart disease of native coronary artery without angina pectoris: Secondary | ICD-10-CM | POA: Diagnosis not present

## 2024-01-06 DIAGNOSIS — Z95818 Presence of other cardiac implants and grafts: Secondary | ICD-10-CM | POA: Diagnosis not present

## 2024-01-06 DIAGNOSIS — I252 Old myocardial infarction: Secondary | ICD-10-CM | POA: Diagnosis not present

## 2024-01-06 DIAGNOSIS — Z720 Tobacco use: Secondary | ICD-10-CM

## 2024-01-06 DIAGNOSIS — Z8673 Personal history of transient ischemic attack (TIA), and cerebral infarction without residual deficits: Secondary | ICD-10-CM | POA: Diagnosis not present

## 2024-01-06 DIAGNOSIS — Z91011 Allergy to milk products: Secondary | ICD-10-CM | POA: Diagnosis not present

## 2024-01-11 DIAGNOSIS — E782 Mixed hyperlipidemia: Secondary | ICD-10-CM | POA: Diagnosis not present

## 2024-01-11 DIAGNOSIS — Z9889 Other specified postprocedural states: Secondary | ICD-10-CM | POA: Diagnosis not present

## 2024-01-11 DIAGNOSIS — Z8679 Personal history of other diseases of the circulatory system: Secondary | ICD-10-CM | POA: Diagnosis not present

## 2024-01-11 DIAGNOSIS — I071 Rheumatic tricuspid insufficiency: Secondary | ICD-10-CM | POA: Diagnosis not present

## 2024-01-11 DIAGNOSIS — G4733 Obstructive sleep apnea (adult) (pediatric): Secondary | ICD-10-CM | POA: Diagnosis not present

## 2024-01-11 DIAGNOSIS — Z720 Tobacco use: Secondary | ICD-10-CM | POA: Diagnosis not present

## 2024-01-11 DIAGNOSIS — I5020 Unspecified systolic (congestive) heart failure: Secondary | ICD-10-CM | POA: Diagnosis not present

## 2024-01-11 DIAGNOSIS — I340 Nonrheumatic mitral (valve) insufficiency: Secondary | ICD-10-CM | POA: Diagnosis not present

## 2024-01-11 DIAGNOSIS — I4819 Other persistent atrial fibrillation: Secondary | ICD-10-CM | POA: Diagnosis not present

## 2024-01-11 DIAGNOSIS — I872 Venous insufficiency (chronic) (peripheral): Secondary | ICD-10-CM | POA: Diagnosis not present

## 2024-01-11 DIAGNOSIS — I10 Essential (primary) hypertension: Secondary | ICD-10-CM | POA: Diagnosis not present

## 2024-01-12 DIAGNOSIS — E041 Nontoxic single thyroid nodule: Secondary | ICD-10-CM | POA: Diagnosis not present

## 2024-01-14 DIAGNOSIS — I4819 Other persistent atrial fibrillation: Secondary | ICD-10-CM | POA: Diagnosis not present

## 2024-01-19 DIAGNOSIS — J449 Chronic obstructive pulmonary disease, unspecified: Secondary | ICD-10-CM | POA: Diagnosis not present

## 2024-01-19 DIAGNOSIS — E042 Nontoxic multinodular goiter: Secondary | ICD-10-CM | POA: Diagnosis not present

## 2024-01-19 DIAGNOSIS — J432 Centrilobular emphysema: Secondary | ICD-10-CM | POA: Diagnosis not present

## 2024-01-19 DIAGNOSIS — Z8639 Personal history of other endocrine, nutritional and metabolic disease: Secondary | ICD-10-CM | POA: Diagnosis not present

## 2024-01-19 DIAGNOSIS — Z5320 Procedure and treatment not carried out because of patient's decision for unspecified reasons: Secondary | ICD-10-CM | POA: Diagnosis not present

## 2024-01-19 DIAGNOSIS — I5020 Unspecified systolic (congestive) heart failure: Secondary | ICD-10-CM | POA: Diagnosis not present

## 2024-01-28 DIAGNOSIS — G4733 Obstructive sleep apnea (adult) (pediatric): Secondary | ICD-10-CM | POA: Diagnosis not present

## 2024-02-11 DIAGNOSIS — Z9889 Other specified postprocedural states: Secondary | ICD-10-CM | POA: Diagnosis not present

## 2024-02-11 DIAGNOSIS — G4733 Obstructive sleep apnea (adult) (pediatric): Secondary | ICD-10-CM | POA: Diagnosis not present

## 2024-02-11 DIAGNOSIS — I10 Essential (primary) hypertension: Secondary | ICD-10-CM | POA: Diagnosis not present

## 2024-02-11 DIAGNOSIS — J449 Chronic obstructive pulmonary disease, unspecified: Secondary | ICD-10-CM | POA: Diagnosis not present

## 2024-02-11 DIAGNOSIS — I5020 Unspecified systolic (congestive) heart failure: Secondary | ICD-10-CM | POA: Diagnosis not present

## 2024-02-11 DIAGNOSIS — I5021 Acute systolic (congestive) heart failure: Secondary | ICD-10-CM | POA: Diagnosis not present

## 2024-02-11 DIAGNOSIS — I5022 Chronic systolic (congestive) heart failure: Secondary | ICD-10-CM | POA: Diagnosis not present

## 2024-02-11 DIAGNOSIS — E782 Mixed hyperlipidemia: Secondary | ICD-10-CM | POA: Diagnosis not present

## 2024-02-11 DIAGNOSIS — Z720 Tobacco use: Secondary | ICD-10-CM | POA: Diagnosis not present

## 2024-02-11 DIAGNOSIS — I4819 Other persistent atrial fibrillation: Secondary | ICD-10-CM | POA: Diagnosis not present

## 2024-02-11 DIAGNOSIS — Z8679 Personal history of other diseases of the circulatory system: Secondary | ICD-10-CM | POA: Diagnosis not present

## 2024-02-11 DIAGNOSIS — I214 Non-ST elevation (NSTEMI) myocardial infarction: Secondary | ICD-10-CM | POA: Diagnosis not present

## 2024-02-25 DIAGNOSIS — I5021 Acute systolic (congestive) heart failure: Secondary | ICD-10-CM | POA: Diagnosis not present

## 2024-03-03 DIAGNOSIS — I214 Non-ST elevation (NSTEMI) myocardial infarction: Secondary | ICD-10-CM | POA: Diagnosis not present

## 2024-03-03 DIAGNOSIS — Z9889 Other specified postprocedural states: Secondary | ICD-10-CM | POA: Diagnosis not present

## 2024-03-03 DIAGNOSIS — Z8679 Personal history of other diseases of the circulatory system: Secondary | ICD-10-CM | POA: Diagnosis not present

## 2024-03-07 ENCOUNTER — Ambulatory Visit
Admission: RE | Admit: 2024-03-07 | Discharge: 2024-03-07 | Disposition: A | Discharge status: Home / Self Care | Source: Ambulatory Visit | Attending: Nurse Practitioner | Admitting: Nurse Practitioner

## 2024-03-07 ENCOUNTER — Other Ambulatory Visit: Payer: Self-pay | Admitting: Nurse Practitioner

## 2024-03-07 DIAGNOSIS — R600 Localized edema: Secondary | ICD-10-CM

## 2024-03-07 DIAGNOSIS — Z9889 Other specified postprocedural states: Secondary | ICD-10-CM | POA: Diagnosis not present

## 2024-03-07 DIAGNOSIS — I340 Nonrheumatic mitral (valve) insufficiency: Secondary | ICD-10-CM | POA: Diagnosis not present

## 2024-03-07 DIAGNOSIS — D649 Anemia, unspecified: Secondary | ICD-10-CM | POA: Diagnosis not present

## 2024-03-07 DIAGNOSIS — Z720 Tobacco use: Secondary | ICD-10-CM | POA: Diagnosis not present

## 2024-03-07 DIAGNOSIS — G4733 Obstructive sleep apnea (adult) (pediatric): Secondary | ICD-10-CM | POA: Diagnosis not present

## 2024-03-07 DIAGNOSIS — I4819 Other persistent atrial fibrillation: Secondary | ICD-10-CM | POA: Diagnosis not present

## 2024-03-07 DIAGNOSIS — E785 Hyperlipidemia, unspecified: Secondary | ICD-10-CM | POA: Diagnosis not present

## 2024-03-07 DIAGNOSIS — I071 Rheumatic tricuspid insufficiency: Secondary | ICD-10-CM | POA: Diagnosis not present

## 2024-03-07 DIAGNOSIS — I5020 Unspecified systolic (congestive) heart failure: Secondary | ICD-10-CM | POA: Diagnosis not present

## 2024-03-07 DIAGNOSIS — E782 Mixed hyperlipidemia: Secondary | ICD-10-CM | POA: Diagnosis not present

## 2024-03-07 DIAGNOSIS — I10 Essential (primary) hypertension: Secondary | ICD-10-CM | POA: Diagnosis not present

## 2024-03-07 DIAGNOSIS — J449 Chronic obstructive pulmonary disease, unspecified: Secondary | ICD-10-CM | POA: Diagnosis not present

## 2024-03-08 ENCOUNTER — Ambulatory Visit: Admitting: Family Medicine

## 2024-03-09 DIAGNOSIS — R0602 Shortness of breath: Secondary | ICD-10-CM | POA: Diagnosis not present

## 2024-03-09 DIAGNOSIS — I5020 Unspecified systolic (congestive) heart failure: Secondary | ICD-10-CM | POA: Diagnosis not present

## 2024-03-09 DIAGNOSIS — I340 Nonrheumatic mitral (valve) insufficiency: Secondary | ICD-10-CM | POA: Diagnosis not present

## 2024-03-09 DIAGNOSIS — J441 Chronic obstructive pulmonary disease with (acute) exacerbation: Secondary | ICD-10-CM | POA: Diagnosis not present

## 2024-03-09 DIAGNOSIS — I071 Rheumatic tricuspid insufficiency: Secondary | ICD-10-CM | POA: Diagnosis not present

## 2024-03-10 DIAGNOSIS — J449 Chronic obstructive pulmonary disease, unspecified: Secondary | ICD-10-CM | POA: Diagnosis not present

## 2024-03-22 DIAGNOSIS — Z9889 Other specified postprocedural states: Secondary | ICD-10-CM | POA: Diagnosis not present

## 2024-03-22 DIAGNOSIS — I5020 Unspecified systolic (congestive) heart failure: Secondary | ICD-10-CM | POA: Diagnosis not present

## 2024-03-22 DIAGNOSIS — M4316 Spondylolisthesis, lumbar region: Secondary | ICD-10-CM | POA: Diagnosis not present

## 2024-03-22 DIAGNOSIS — G4733 Obstructive sleep apnea (adult) (pediatric): Secondary | ICD-10-CM | POA: Diagnosis not present

## 2024-03-22 DIAGNOSIS — Z8679 Personal history of other diseases of the circulatory system: Secondary | ICD-10-CM | POA: Diagnosis not present

## 2024-03-22 DIAGNOSIS — Z95818 Presence of other cardiac implants and grafts: Secondary | ICD-10-CM | POA: Diagnosis not present

## 2024-03-22 DIAGNOSIS — I10 Essential (primary) hypertension: Secondary | ICD-10-CM | POA: Diagnosis not present

## 2024-03-22 DIAGNOSIS — M545 Low back pain, unspecified: Secondary | ICD-10-CM | POA: Diagnosis not present

## 2024-03-22 DIAGNOSIS — J449 Chronic obstructive pulmonary disease, unspecified: Secondary | ICD-10-CM | POA: Diagnosis not present

## 2024-03-22 DIAGNOSIS — I480 Paroxysmal atrial fibrillation: Secondary | ICD-10-CM | POA: Diagnosis not present

## 2024-03-22 DIAGNOSIS — I38 Endocarditis, valve unspecified: Secondary | ICD-10-CM | POA: Diagnosis not present

## 2024-03-22 DIAGNOSIS — M4807 Spinal stenosis, lumbosacral region: Secondary | ICD-10-CM | POA: Diagnosis not present

## 2024-03-22 DIAGNOSIS — E782 Mixed hyperlipidemia: Secondary | ICD-10-CM | POA: Diagnosis not present

## 2024-03-22 DIAGNOSIS — M48062 Spinal stenosis, lumbar region with neurogenic claudication: Secondary | ICD-10-CM | POA: Diagnosis not present

## 2024-03-22 DIAGNOSIS — I5022 Chronic systolic (congestive) heart failure: Secondary | ICD-10-CM | POA: Diagnosis not present

## 2024-03-27 ENCOUNTER — Other Ambulatory Visit: Payer: Self-pay | Admitting: Orthopedic Surgery

## 2024-03-27 DIAGNOSIS — M4807 Spinal stenosis, lumbosacral region: Secondary | ICD-10-CM

## 2024-03-28 ENCOUNTER — Ambulatory Visit
Admission: RE | Admit: 2024-03-28 | Discharge: 2024-03-28 | Disposition: A | Discharge status: Home / Self Care | Source: Ambulatory Visit | Attending: Orthopedic Surgery | Admitting: Orthopedic Surgery

## 2024-03-28 ENCOUNTER — Encounter: Payer: Self-pay | Admitting: Family Medicine

## 2024-03-28 DIAGNOSIS — M4856XA Collapsed vertebra, not elsewhere classified, lumbar region, initial encounter for fracture: Secondary | ICD-10-CM | POA: Diagnosis not present

## 2024-03-28 DIAGNOSIS — M47816 Spondylosis without myelopathy or radiculopathy, lumbar region: Secondary | ICD-10-CM | POA: Diagnosis not present

## 2024-03-28 DIAGNOSIS — M4807 Spinal stenosis, lumbosacral region: Secondary | ICD-10-CM | POA: Insufficient documentation

## 2024-03-28 DIAGNOSIS — Z95818 Presence of other cardiac implants and grafts: Secondary | ICD-10-CM | POA: Insufficient documentation

## 2024-03-28 DIAGNOSIS — M5127 Other intervertebral disc displacement, lumbosacral region: Secondary | ICD-10-CM | POA: Diagnosis not present

## 2024-03-28 DIAGNOSIS — M47817 Spondylosis without myelopathy or radiculopathy, lumbosacral region: Secondary | ICD-10-CM | POA: Diagnosis not present

## 2024-03-28 NOTE — Progress Notes (Unsigned)
 Stephanie [name] T. Stephanie Karn, MD, CAQ Sports Medicine [facility] [address] [place], [address]  Phone: [phone]  FAX: [phone]  Stephanie [name] - 73 y.o. female  MRN [id number]  Date of Birth: December 27, 1950  Date: 03/29/2024  PCP: Patient, No Pcp Per  Referral: Stephanie [name], Stephanie [name] *  No chief complaint on file.  Subjective:   Stephanie [name] is a 73 y.o. very pleasantt female patient with There is no height or weight on file to calculate BMI. who presents with the following:  The patient is here as a new patient office visit to me.  She has been getting her medical care at [facility], and she still sees [facility] for cardiac and pulmonology care.  History is significant for myocardial infarction most recently in December 2024.  Since then, she has been admitted to the hospital 3 other times and also has been to the ER multiple times.  She most recently saw cardiology a few days ago. Her most recent echocardiogram showed an EF of 35% with global hypokinesis done on March 09, 2024. She also has atrial fibrillation, she has had a Watchman procedure, ablation, and she is currently on chronic Eliquis .  Multiple other medical problems including hypertension, hyperlipidemia, depression, anxiety, restless leg syndrome.  Cardiac and blood pressure, the patient is on Lasix , Toprol -XL 100 mg,  Depression, currently on Zoloft  50 mg.    Review of Systems is noted in the HPI, as appropriate  Patient Active Problem List   Diagnosis Date Noted   History of non-ST elevation myocardial infarction (NSTEMI) 01/05/2024    Priority: High   Chronic combined systolic and diastolic heart failure (HCC) 11/27/2023    Priority: High   Paroxysmal A-fib (HCC) 05/24/2015    Priority: High   Presence of Watchman left atrial appendage closure device 03/28/2024    Priority: Medium    Mixed hyperlipidemia 08/23/2023    Priority: Medium    Essential  hypertension 05/24/2015    Priority: Medium    Anxiety and depression 05/24/2015    Priority: Medium    Tobacco abuse 05/24/2015    Priority: Medium    Restless leg syndrome 08/23/2015    Priority: Low   Non-toxic multinodular goiter 03/16/2023   QT prolongation 07/02/2022   Venous insufficiency of both lower extremities 05/17/2018   Vitamin D  deficiency 09/07/2017    Past Medical History:  Diagnosis Date   Anxiety and depression 05/24/2015   Chronic combined systolic and diastolic heart failure (HCC) 11/27/2023   Diverticulosis    History of non-ST elevation myocardial infarction (NSTEMI) 01/05/2024   Hyperlipidemia    Hypertension    Paroxysmal A-fib (HCC) 05/24/2015    Past Surgical History:  Procedure Laterality Date   CARDIOVERSION N/A 05/12/2018   Procedure: CARDIOVERSION;  Surgeon: Hester Wolm PARAS, MD;  Location: [facility] ORS;  Service: Cardiovascular;  Laterality: N/A;   CARDIOVERSION N/A 06/19/2021   Procedure: CARDIOVERSION;  Surgeon: Florencio Cara BIRCH, MD;  Location: [facility] ORS;  Service: Cardiovascular;  Laterality: N/A;   CARDIOVERSION N/A 12/06/2023   Procedure: CARDIOVERSION;  Surgeon: Florencio Cara BIRCH, MD;  Location: [facility] ORS;  Service: Cardiovascular;  Laterality: N/A;   CHOLECYSTECTOMY  2004   COLONOSCOPY WITH PROPOFOL  N/A 05/24/2019   Procedure: COLONOSCOPY WITH PROPOFOL ;  Surgeon: Toledo, Ladell POUR, MD;  Location: [facility] ENDOSCOPY;  Service: Gastroenterology;  Laterality: N/A;   ESOPHAGOGASTRODUODENOSCOPY (EGD) WITH PROPOFOL  N/A 05/24/2019   Procedure: ESOPHAGOGASTRODUODENOSCOPY (EGD) WITH PROPOFOL ;  Surgeon: Malone,  Teodoro K, MD;  Location: [facility] ENDOSCOPY;  Service: Gastroenterology;  Laterality: N/A;   KNEE SURGERY     KNEE SURGERY     LEFT HEART CATH AND CORONARY ANGIOGRAPHY N/A 08/23/2023   Procedure: LEFT HEART CATH AND CORONARY ANGIOGRAPHY;  Surgeon: Florencio Cara BIRCH, MD;  Location: [facility] INVASIVE CV LAB;  Service: Cardiovascular;  Laterality: N/A;   TEE  WITHOUT CARDIOVERSION N/A 05/12/2018   Procedure: TRANSESOPHAGEAL ECHOCARDIOGRAM (TEE);  Surgeon: Hester Wolm PARAS, MD;  Location: [facility] ORS;  Service: Cardiovascular;  Laterality: N/A;   TOE SURGERY      Family History  Problem Relation Age of Onset   Breast cancer Neg Hx     Social History   Social History Narrative   Divorced and lives alone.  Indoor pets, 2 cats.      Objective:   There were no vitals taken for this visit.  GEN: No acute distress; alert,appropriate. PULM: Breathing comfortably in no respiratory distress PSYCH: Normally interactive.   Laboratory and Imaging Data:  Assessment and Plan:   ***

## 2024-03-29 ENCOUNTER — Ambulatory Visit (INDEPENDENT_AMBULATORY_CARE_PROVIDER_SITE_OTHER): Admitting: Family Medicine

## 2024-03-29 ENCOUNTER — Encounter: Payer: Self-pay | Admitting: Family Medicine

## 2024-03-29 VITALS — BP 100/70 | HR 101 | Temp 97.0°F | Ht 65.0 in | Wt 166.5 lb

## 2024-03-29 DIAGNOSIS — Z720 Tobacco use: Secondary | ICD-10-CM

## 2024-03-29 DIAGNOSIS — J431 Panlobular emphysema: Secondary | ICD-10-CM

## 2024-03-29 DIAGNOSIS — Z8673 Personal history of transient ischemic attack (TIA), and cerebral infarction without residual deficits: Secondary | ICD-10-CM | POA: Insufficient documentation

## 2024-03-29 DIAGNOSIS — F419 Anxiety disorder, unspecified: Secondary | ICD-10-CM | POA: Diagnosis not present

## 2024-03-29 DIAGNOSIS — J449 Chronic obstructive pulmonary disease, unspecified: Secondary | ICD-10-CM | POA: Insufficient documentation

## 2024-03-29 DIAGNOSIS — E782 Mixed hyperlipidemia: Secondary | ICD-10-CM

## 2024-03-29 DIAGNOSIS — I480 Paroxysmal atrial fibrillation: Secondary | ICD-10-CM | POA: Diagnosis not present

## 2024-03-29 DIAGNOSIS — I10 Essential (primary) hypertension: Secondary | ICD-10-CM | POA: Diagnosis not present

## 2024-03-29 DIAGNOSIS — F32A Depression, unspecified: Secondary | ICD-10-CM | POA: Diagnosis not present

## 2024-03-29 DIAGNOSIS — I5042 Chronic combined systolic (congestive) and diastolic (congestive) heart failure: Secondary | ICD-10-CM

## 2024-03-29 DIAGNOSIS — I252 Old myocardial infarction: Secondary | ICD-10-CM

## 2024-03-29 DIAGNOSIS — Z95818 Presence of other cardiac implants and grafts: Secondary | ICD-10-CM | POA: Diagnosis not present

## 2024-03-29 MED ORDER — BUPROPION HCL ER (XL) 150 MG PO TB24: 150.0000 mg | ORAL_TABLET | Freq: Every day | ORAL | 3 refills | Status: DC

## 2024-03-29 NOTE — Patient Instructions (Addendum)
 Call Pulmonology and ask if you are supposed to use Advair twice a day

## 2024-04-28 DIAGNOSIS — M5416 Radiculopathy, lumbar region: Secondary | ICD-10-CM | POA: Diagnosis not present

## 2024-05-16 DIAGNOSIS — M5416 Radiculopathy, lumbar region: Secondary | ICD-10-CM | POA: Diagnosis not present

## 2024-05-16 DIAGNOSIS — G8929 Other chronic pain: Secondary | ICD-10-CM | POA: Diagnosis not present

## 2024-05-16 DIAGNOSIS — M5441 Lumbago with sciatica, right side: Secondary | ICD-10-CM | POA: Diagnosis not present

## 2024-05-16 DIAGNOSIS — M5442 Lumbago with sciatica, left side: Secondary | ICD-10-CM | POA: Diagnosis not present

## 2024-05-23 ENCOUNTER — Telehealth: Payer: Self-pay | Admitting: *Deleted

## 2024-05-23 NOTE — Telephone Encounter (Signed)
 Spoke with Stephanie [name] and scheduled her an office visit with Dr. Watt 05/25/24 at 11:40 am.  She states she stopped the Wellbutrin  about a week ago due to side effects (diarrhea).

## 2024-05-23 NOTE — Telephone Encounter (Signed)
 Ok, can you check and see if the patient would like to follow-up about her depression?

## 2024-05-23 NOTE — Telephone Encounter (Signed)
 Copied from CRM #[id number]. Topic: General - Other >> May 23, 2024  3:24 PM Mesmerise C wrote: Reason for CRM: Olam Nurse case manager from [employer] advised patient had a depression screen done and came back that she has moderate depression, also advised patient stopped taking Wellbutrin  due to side effects if having additional questions she can be reached at [phone]

## 2024-05-24 ENCOUNTER — Other Ambulatory Visit: Payer: Self-pay

## 2024-05-24 ENCOUNTER — Emergency Department
Admission: EM | Admit: 2024-05-24 | Discharge: 2024-05-24 | Disposition: A | Discharge status: Home / Self Care | Source: Ambulatory Visit | Attending: Emergency Medicine | Admitting: Emergency Medicine

## 2024-05-24 ENCOUNTER — Emergency Department

## 2024-05-24 DIAGNOSIS — N179 Acute kidney failure, unspecified: Secondary | ICD-10-CM | POA: Insufficient documentation

## 2024-05-24 DIAGNOSIS — R197 Diarrhea, unspecified: Secondary | ICD-10-CM | POA: Insufficient documentation

## 2024-05-24 DIAGNOSIS — G4733 Obstructive sleep apnea (adult) (pediatric): Secondary | ICD-10-CM | POA: Diagnosis not present

## 2024-05-24 DIAGNOSIS — R031 Nonspecific low blood-pressure reading: Secondary | ICD-10-CM | POA: Insufficient documentation

## 2024-05-24 DIAGNOSIS — J449 Chronic obstructive pulmonary disease, unspecified: Secondary | ICD-10-CM | POA: Diagnosis not present

## 2024-05-24 DIAGNOSIS — I4891 Unspecified atrial fibrillation: Secondary | ICD-10-CM | POA: Insufficient documentation

## 2024-05-24 DIAGNOSIS — I959 Hypotension, unspecified: Secondary | ICD-10-CM | POA: Diagnosis not present

## 2024-05-24 DIAGNOSIS — R531 Weakness: Secondary | ICD-10-CM | POA: Insufficient documentation

## 2024-05-24 LAB — COMPREHENSIVE METABOLIC PANEL WITH GFR
ALT: 12 U/L (ref 0–44)
AST: 25 U/L (ref 15–41)
Albumin: 3.6 g/dL (ref 3.5–5.0)
Alkaline Phosphatase: 77 U/L (ref 38–126)
Anion gap: 12 (ref 5–15)
BUN: 22 mg/dL (ref 8–23)
CO2: 23 mmol/L (ref 22–32)
Calcium: 9.2 mg/dL (ref 8.9–10.3)
Chloride: 105 mmol/L (ref 98–111)
Creatinine, Ser: 1.53 mg/dL — ABNORMAL HIGH (ref 0.44–1.00)
GFR, Estimated: 35 mL/min — ABNORMAL LOW (ref >60)
Glucose, Bld: 94 mg/dL (ref 70–99)
Potassium: 4.1 mmol/L (ref 3.5–5.1)
Sodium: 140 mmol/L (ref 135–145)
Total Bilirubin: 0.9 mg/dL (ref 0.0–1.2)
Total Protein: 7.3 g/dL (ref 6.5–8.1)

## 2024-05-24 LAB — URINALYSIS, ROUTINE W REFLEX MICROSCOPIC
Bilirubin Urine: NEGATIVE
Glucose, UA: >=500 mg/dL — AB
Hgb urine dipstick: NEGATIVE
Ketones, ur: NEGATIVE mg/dL
Leukocytes,Ua: NEGATIVE
Nitrite: NEGATIVE
Protein, ur: NEGATIVE mg/dL
Specific Gravity, Urine: 1.005 (ref 1.005–1.030)
pH: 6 (ref 5.0–8.0)

## 2024-05-24 LAB — RESP PANEL BY RT-PCR (RSV, FLU A&B, COVID)  RVPGX2
Influenza A by PCR: NEGATIVE
Influenza B by PCR: NEGATIVE
Resp Syncytial Virus by PCR: NEGATIVE
SARS Coronavirus 2 by RT PCR: NEGATIVE

## 2024-05-24 LAB — CBC
HCT: 45.1 % (ref 36.0–46.0)
Hemoglobin: 14.6 g/dL (ref 12.0–15.0)
MCH: 31 pg (ref 26.0–34.0)
MCHC: 32.4 g/dL (ref 30.0–36.0)
MCV: 95.8 fL (ref 80.0–100.0)
Platelets: 255 K/uL (ref 150–400)
RBC: 4.71 MIL/uL (ref 3.87–5.11)
RDW: 14.4 % (ref 11.5–15.5)
WBC: 5.7 K/uL (ref 4.0–10.5)
nRBC: 0 % (ref 0.0–0.2)

## 2024-05-24 LAB — TROPONIN I (HIGH SENSITIVITY)
Troponin I (High Sensitivity): 33 ng/L — ABNORMAL HIGH (ref <18)
Troponin I (High Sensitivity): 34 ng/L — ABNORMAL HIGH (ref <18)

## 2024-05-24 MED ORDER — SODIUM CHLORIDE 0.9 % IV BOLUS
500.0000 mL | Freq: Once | INTRAVENOUS | Status: AC
  Administered 2024-05-24: 500 mL via INTRAVENOUS

## 2024-05-24 MED ORDER — METOPROLOL TARTRATE 50 MG PO TABS
50.0000 mg | ORAL_TABLET | Freq: Once | ORAL | Status: DC
  Filled 2024-05-24: qty 1

## 2024-05-24 MED ORDER — METOPROLOL TARTRATE 25 MG PO TABS: 25.0000 mg | ORAL_TABLET | Freq: Once | ORAL | Status: DC

## 2024-05-24 MED ORDER — METOPROLOL TARTRATE 25 MG PO TABS
25.0000 mg | ORAL_TABLET | Freq: Once | ORAL | Status: AC | PRN
  Administered 2024-05-24: 25 mg via ORAL
  Filled 2024-05-24: qty 1

## 2024-05-24 MED ORDER — DILTIAZEM HCL 25 MG/5ML IV SOLN
15.0000 mg | Freq: Once | INTRAVENOUS | Status: AC
  Administered 2024-05-24: 15 mg via INTRAVENOUS
  Filled 2024-05-24: qty 5

## 2024-05-24 NOTE — ED Provider Notes (Signed)
 [facility] Provider Note    Event Date/Time   First MD Initiated Contact with Patient 05/24/24 1433     (approximate)   History   Hypotension   HPI  Stephanie [name] is a 73 y.o. female with history of A-fib status post ablation, Watchman device who comes in with concerns for weakness.  Patient reports over the past week she has not been feeling well.  She reports multiple episodes of diarrhea.  She denies any overt chest pain, shortness of breath.  She states that she went over to clinic and they found that her heart rate was 180 and they sent her here for evaluation.  She did not realize that her heart rate was not fast so that she was back in A-fib.     Physical Exam   Triage Vital Signs: ED Triage Vitals  Encounter Vitals Group     BP 05/24/24 1416 (!) 70/55     Girls Systolic BP Percentile --      Girls Diastolic BP Percentile --      Boys Systolic BP Percentile --      Boys Diastolic BP Percentile --      Pulse Rate 05/24/24 1416 (!) 186     Resp 05/24/24 1415 (!) 22     Temp 05/24/24 1415 98.1 F (36.7 C)     Temp Source 05/24/24 1415 Oral     SpO2 05/24/24 1415 98 %     Weight --      Height --      Head Circumference --      Peak Flow --      Pain Score 05/24/24 1415 0     Pain Loc --      Pain Education --      Exclude from Growth Chart --     Most recent vital signs: Vitals:   05/24/24 1416 05/24/24 1421  BP: (!) 70/55 (!) 82/44  Pulse: (!) 186   Resp:    Temp:    SpO2:       General: Awake, no distress.  CV:  Good peripheral perfusion.  Resp:  Normal effort.  Abd:  No distention.  Soft and nontender Other:  No swelling in legs.  No calf tenderness   ED Results / Procedures / Treatments   Labs (all labs ordered are listed, but only abnormal results are displayed) Labs Reviewed  RESP PANEL BY RT-PCR (RSV, FLU A&B, COVID)  [id number]  CBC  COMPREHENSIVE METABOLIC PANEL WITH GFR  URINALYSIS, ROUTINE W REFLEX  MICROSCOPIC  CBG MONITORING, ED     EKG  My interpretation of EKG:  Initial EKG looks more like SVT but the cardiac monitor I did look kind of irregular so getting repeat EKG  Atrial fibrillation with a rate of 153 without any ST elevation or inferior lateral T wave inversions, normal intervals  Looks like atrial flutter with a rate of 61 without any ST elevation or T wave inversions, normal intervals  RADIOLOGY I have reviewed the xray personally and interpreted    PROCEDURES:  Critical Care performed: Yes, see critical care procedure note(s)  .1-3 Lead EKG Interpretation  Performed by: Ernest Ronal BRAVO, MD Authorized by: Ernest Ronal BRAVO, MD     Interpretation: abnormal     ECG rate:  160   ECG rate assessment: tachycardic     Rhythm: atrial fibrillation     Ectopy: none     Conduction: normal   .Critical Care  Performed by: Ernest Ronal BRAVO, MD Authorized by: Ernest Ronal BRAVO, MD   Critical care provider statement:    Critical care time (minutes):  30   Critical care was necessary to treat or prevent imminent or life-threatening deterioration of the following conditions:  Cardiac failure   Critical care was time spent personally by me on the following activities:  Development of treatment plan with patient or surrogate, discussions with consultants, evaluation of patient's response to treatment, examination of patient, ordering and review of laboratory studies, ordering and review of radiographic studies, ordering and performing treatments and interventions, pulse oximetry, re-evaluation of patient's condition and review of old charts    MEDICATIONS ORDERED IN ED: Medications  metoprolol  tartrate (LOPRESSOR ) tablet 50 mg (has no administration in time range)  sodium chloride  0.9 % bolus 500 mL (500 mLs Intravenous New Bag/Given 05/24/24 1447)  diltiazem  (CARDIZEM ) injection 15 mg (15 mg Intravenous Given 05/24/24 1442)     IMPRESSION / MDM / ASSESSMENT AND PLAN / ED COURSE  I  reviewed the triage vital signs and the nursing notes.   Patient's presentation is most consistent with acute presentation with potential threat to life or bodily function.   Patient comes in with A-fib with RVR.  She did look more like SVT but a repeat EKG does confirm A-fib with RVR which is more consistent with patient's history.  Blood pressure before diltiazem  was in the 100 systolic so patient was given 15 of IV diltiazem  with heart rates now down to the 60s.  EKG looks more like an atrial flutter.  I suspect that patient does have some dehydration given she reports a lot of diarrhea so we will give patient some fluids.  Lab work was done in order to evaluate for [employer] abnormalities, AKI  Troponin was slightly elevated more likely demand we will get a repeat.  Her CMP does show a little bit elevation of creatinine and she is getting some IV fluids.  CBC reassuring.   Reevaluated patient heart rates are still in the 60s blood pressures are improving with systolic 100s.  Will give patient additional fluid bolus.  We discussed maybe discussing with her doctor about holding her Lasix , hydrochlorothiazide , spironolactone as these can all cause some dehydration especially in the setting of her diarrhea.  I will also add on stool studies.  Patient handed off to oncoming team having monitoring her heart rates, repeat troponin   The patient is on the cardiac monitor to evaluate for evidence of arrhythmia and/or significant heart rate changes.      FINAL CLINICAL IMPRESSION(S) / ED DIAGNOSES   Final diagnoses:  AKI (acute kidney injury) (HCC)  Diarrhea, unspecified type  Atrial fibrillation with rapid ventricular response (HCC)     Rx / DC Orders   ED Discharge Orders     None        Note:  This document was prepared using Dragon voice recognition software and may include unintentional dictation errors.   Ernest Ronal BRAVO, MD 05/24/24 [phone]

## 2024-05-24 NOTE — ED Notes (Signed)
 Pt in bed, pt has low heart rate and blood pressure, help metop, md aware.

## 2024-05-24 NOTE — ED Provider Notes (Signed)
 Care of this patient assumed from prior physician at 1500 pending follow-up on labs, assessment of rate control and blood pressure and disposition with possible discharge if reassuring. Please see prior physician note for further details.  Briefly, this is 73 year old female with known history of A-fib s/p placement presenting to the emergency department for evaluation of weakness in the setting of diarrhea.  Had been rhythm controlled after her ablation, here found to have recurrent A-fib with associated hypotension that initially improved, but worsened BP after receiving diltiazem .  Blood pressure starting to improve at the time I assumed care.  Initial labs with reassuring CBC, CMP with mild AKI.  Ordered for 2 500 cc fluid boluses.  Urinalysis without evidence of infection.  Viral swab negative.  Initial troponin mildly elevated at 33, repeat pending at time I assumed care.  Repeat troponin remained stable at 34.  Patient had persistently improved blood pressure and heart rate.  She was reassessed and eager to be discharged home.  She does have a primary care follow-up tomorrow.  Do think discharge is reasonable.  I will place referral to facilitate follow-up with cardiology, sees Dr. Florencio and also has cardiology specialist at [facility].  Strict return precautions provided.  Patient discharged in stable condition.   Levander Slate, MD 05/24/24 2018

## 2024-05-24 NOTE — ED Triage Notes (Signed)
 Patient states she was sent over from doctors office for hypotension and bradycardia.

## 2024-05-24 NOTE — Discharge Instructions (Signed)
 You were seen in the ER today for evaluation of your weakness.  I suspect you may have been dehydrated and this may have precipitated your atrial fibrillation.  Your heart rate and pressure fortunately improved here, but you do still remain in atrial fibrillation/atrial flutter.  Please arrange close follow-up with your cardiology team for further evaluation.  Return to the ER for new or worsening symptoms.

## 2024-05-25 ENCOUNTER — Ambulatory Visit (INDEPENDENT_AMBULATORY_CARE_PROVIDER_SITE_OTHER): Admitting: Family Medicine

## 2024-05-25 ENCOUNTER — Encounter: Payer: Self-pay | Admitting: Family Medicine

## 2024-05-25 VITALS — BP 94/66 | HR 104 | Temp 98.0°F | Ht 65.0 in | Wt 169.1 lb

## 2024-05-25 DIAGNOSIS — I480 Paroxysmal atrial fibrillation: Secondary | ICD-10-CM

## 2024-05-25 DIAGNOSIS — F330 Major depressive disorder, recurrent, mild: Secondary | ICD-10-CM

## 2024-05-25 DIAGNOSIS — I252 Old myocardial infarction: Secondary | ICD-10-CM

## 2024-05-25 DIAGNOSIS — Z23 Encounter for immunization: Secondary | ICD-10-CM | POA: Diagnosis not present

## 2024-05-25 DIAGNOSIS — I10 Essential (primary) hypertension: Secondary | ICD-10-CM

## 2024-05-25 DIAGNOSIS — I5042 Chronic combined systolic (congestive) and diastolic (congestive) heart failure: Secondary | ICD-10-CM

## 2024-05-25 DIAGNOSIS — Z8673 Personal history of transient ischemic attack (TIA), and cerebral infarction without residual deficits: Secondary | ICD-10-CM

## 2024-05-25 DIAGNOSIS — K529 Noninfective gastroenteritis and colitis, unspecified: Secondary | ICD-10-CM | POA: Diagnosis not present

## 2024-05-25 NOTE — Progress Notes (Signed)
 Nolah Krenzer T. Maggy Wyble, MD, CAQ Sports Medicine [facility] [address] [place], [address]  Phone: [phone]  FAX: [phone]  Stephanie [name] - 73 y.o. female  MRN [id number]  Date of Birth: 01/09/1951  Date: 05/25/2024  PCP: Watt Mirza, MD  Referral: Watt Mirza, MD  Chief Complaint  Patient presents with   Depression   Follow-up    05/24/24-A-Fib   Subjective:   Lovette Merta is a 73 y.o. very pleasant female patient with Body mass index is 28.14 kg/m. who presents with the following:  Discussed the use of AI scribe software for clinical note transcription with the patient, who gave verbal consent to proceed.  The patient was initially scheduled for depression evaluation, however she went to the emergency room yesterday and is here for acute hospital follow-up, as well.  She does have a history of A-fib, and had been rhythm controlled after ablation.  She was found to have recurrent A-fib in the emergency room with hypotension.  Blood pressure dropped again after diltiazem .  She did receive a fluid bolus of 2500 cc.  She actually was found to have a heart rate at 180 and was subsequently sent to the emergency room.  Troponins were 33 and 34.  The ER attempted to make a cardiology follow-up with Dr. Florencio from [facility].  Today, pulse is at 104 History of Present Illness Gurleen Larrivee is a 73 year old female with atrial fibrillation and congestive heart failure who presents with recent emergency room visit for tachycardia and hypotension.  She recently visited the emergency room after her pulmonologist noted a blood pressure of 80/50 mmHg. At the ER, her heart rate was 184 bpm and blood pressure was in the 60s. She was kept in the hospital for the day after presenting with a high heart rate and low blood pressure. She had an ablation procedure in March but is experiencing recurrence of symptoms.  She  has a history of atrial fibrillation and underwent a Watchman procedure a couple of years ago. She is currently on metoprolol  twice a day and is not on any blood thinners. She is also on losartan , spironolactone, and furosemide , although she is not taking furosemide  regularly. She is concerned about the dosages of her medications, particularly metoprolol , which she has been taking for years but at a lower dose.  She has congestive heart failure and has been prescribed medications to help with heart efficiency. She has experienced a heart attack in December and a mini-stroke in the past. She is concerned about the balance between managing heart function and maintaining blood pressure.  She lives alone in a senior community and experiences feelings of depression and anxiety, particularly after her recent health issues. She describes herself as 'not thinking of hurting myself or anything' but feels down and depressed most days. She has difficulty sleeping, with alternating nights of no sleep and excessive sleep, and uses a CPAP machine. Her interest in activities is low, and she struggles with social engagement.  She has a history of gastrointestinal issues, including diarrhea, which she thinks could possibly be associated with Wellbutrin . She has experienced diarrhea for up to three weeks at a time. She has had a colonoscopy four to five years ago and has seen a GI specialist in the past, but she does not believe she had a good rapport with that physician. She uses over-the-counter medications like Imodium  to manage her symptoms.    Review  of Systems is noted in the HPI, as appropriate  Objective:   BP 94/66   Pulse (!) 104   Temp 98 F (36.7 C) (Temporal)   Ht 5' 5 (1.651 m)   Wt 169 lb 2 oz (76.7 kg)   SpO2 97%   BMI 28.14 kg/m   GEN: No acute distress; alert,appropriate. CV: Irregularly irregular, rate 100 PULM: Normal respiratory rate, no accessory muscle use. No wheezes, crackles or  rhonchi  PSYCH: Normally interactive.   Physical Exam   Laboratory and Imaging Data:  Assessment and Plan:     ICD-10-CM   1. Paroxysmal A-fib (HCC)  I48.0     2. Need for influenza vaccination  Z23 Flu vaccine HIGH DOSE PF(Fluzone Trivalent)    3. Mild episode of recurrent major depressive disorder (HCC)  F33.0     4. Chronic diarrhea  K52.9 Ambulatory referral to Gastroenterology    CANCELED: Ambulatory referral to Gastroenterology    5. Chronic combined systolic and diastolic heart failure (HCC)  P49.57     6. History of non-ST elevation myocardial infarction (NSTEMI)  I25.2     7. History of TIA (transient ischemic attack)  Z86.73      Assessment & Plan Paroxysmal atrial fibrillation with history of failed ablation and Watchman device Recent atrial fibrillation episode with rapid ventricular response and hypotension. Currently on metoprolol , not on rate-controlling drugs or anticoagulants. Ablation failure acknowledged. - Attend cardiology appointment tomorrow for further evaluation and management. - Discuss potential rate-controlling medications and anticoagulants with cardiologist. - Consider further ablation procedures based on cardiology evaluation.  Chronic combined systolic and diastolic heart failure with history of NSTEMI Managed with losartan , metoprolol , spironolactone, and furosemide . Recent dehydration and hypotension episodes. Requires balancing medications to avoid hypotension or renal impairment. - Continue losartan , metoprolol , spironolactone, and furosemide . - Discuss potential medication adjustments with cardiologist.  Essential hypertension Management complicated by heart failure and recent hypotensive episode. Currently on losartan  and metoprolol . - Discuss potential medication adjustments with cardiologist.  Depression and anxiety Reports feeling down and depressed most days, living alone, limited social interaction. History of Wellbutrin  use,  stopped due to suspected diarrhea side effect. Considering restarting Wellbutrin . - Restart Wellbutrin  and monitor for recurrence of diarrhea. - Evaluate antidepressant effect over 6-8 weeks. - Consider referral to GI specialist for chronic diarrhea management.  Chronic diarrhea Chronic diarrhea with episodes lasting up to three weeks. Previous GI evaluations inconclusive.  The patient suspected link to Wellbutrin , but diarrhea persists independently. Limited local GI options. - Patient prefers a different GI practice, previously saw [facility] - Use over-the-counter Imodium  as needed for diarrhea management. - Monitor response to Wellbutrin  reintroduction for any correlation with diarrhea.  Medication Management during today's office visit: No orders of the defined types were placed in this encounter.  There are no discontinued medications.  Orders placed today for conditions managed today: Orders Placed This Encounter  Procedures   Flu vaccine HIGH DOSE PF(Fluzone Trivalent)   Ambulatory referral to Gastroenterology    Disposition: Return in about 2 months (around 07/25/2024) for depression.  Dr[employer] One speech-to-text software was used for transcription in this dictation.  Possible transcriptional errors can occur using Animal nutritionist.   Signed,  Jacques DASEN. Swain Acree, MD   Outpatient Encounter Medications as of 05/25/2024  Medication Sig   acetaminophen  (TYLENOL ) 500 MG tablet Take 500 mg by mouth every 4 (four) hours as needed.   albuterol  (VENTOLIN  HFA) 108 (90 Base) MCG/ACT inhaler Inhale 2 puffs into the  lungs every 6 (six) hours as needed for wheezing or shortness of breath.   Ascorbic Acid  (VITAMIN C  PO) Take 1 tablet by mouth daily.   aspirin  EC 81 MG tablet Take 81 mg by mouth once.   buPROPion  (WELLBUTRIN  XL) 150 MG 24 hr tablet Take 1 tablet (150 mg total) by mouth daily.   calcium  citrate-vitamin D  (CITRACAL+D) 315-200 MG-UNIT per tablet Take 1 tablet by mouth  daily.    colestipol  (COLESTID ) 1 g tablet Take 1 g by mouth daily.   empagliflozin (JARDIANCE) 10 MG TABS tablet Take 10 mg by mouth daily.   fluticasone-salmeterol (ADVAIR) 250-50 MCG/ACT AEPB Inhale 1 puff into the lungs in the morning and at bedtime.   furosemide  (LASIX ) 20 MG tablet Take 1 tablet (20 mg total) by mouth 2 (two) times daily. Increase to 1 tablet (20 mg total) by mouth TWICE daily (total daily dose 40 mg) as needed for up to 3 days for increased leg swelling, shortness of breath, weight gain 5+ lbs over 1-2 days. Seek medical care if these symptoms are not improving with increased dose.   Glycerin, PF, (OPTASE COMFORT DRY EYE) 1 % SOLN Apply 1 drop to eye 2 (two) times daily as needed.   ipratropium (ATROVENT) 0.03 % nasal spray Place 1 spray into the nose 2 (two) times daily as needed.   losartan  (COZAAR ) 25 MG tablet Take 25 mg by mouth daily.   metoprolol  succinate (TOPROL -XL) 50 MG 24 hr tablet Take 50 mg by mouth 2 (two) times daily.   montelukast (SINGULAIR) 10 MG tablet Take 1 tablet by mouth at bedtime.   sertraline  (ZOLOFT ) 50 MG tablet Take 50 mg by mouth daily.   Spacer/Aero-Holding Chambers (AEROCHAMBER MV) inhaler Use as instructed   spironolactone (ALDACTONE) 25 MG tablet Take 12.5 mg by mouth daily.   VEVYE 0.1 % SOLN Place 1 drop into both eyes 2 (two) times daily.   No facility-administered encounter medications on file as of 05/25/2024.

## 2024-05-26 ENCOUNTER — Ambulatory Visit: Payer: Self-pay

## 2024-05-26 NOTE — Telephone Encounter (Signed)
 Patient called in stated she got the flu shot yesterday, stated she got really sick, stated she was freezing, sweating, and weak wanted to know if these are common side effects, would like for someone to give her a callback regarding this   1st attempt, no answer, LVM with callback number

## 2024-05-26 NOTE — Telephone Encounter (Signed)
 Per 12:20 pm entry in this phone note pt was triaged and given Home care instructions. Sending to [facility] and Dr Watt.

## 2024-05-26 NOTE — Telephone Encounter (Signed)
 FYI Only or Action Required?: FYI only for provider.  Patient was last seen in primary care on 05/25/2024 by Watt Mirza, MD.  Called Nurse Triage reporting Vaccine Reaction.  Symptoms began yesterday.  Interventions attempted: Rest, hydration, or home remedies.  Symptoms are: gradually improving.  Triage Disposition: Home Care  Patient/caregiver understands and will follow disposition?: Yes  Reason for Disposition  Influenza (Injection; Quadrivalent or Trivalent) injected vaccine reactions  Answer Assessment - Initial Assessment Questions Patient states she tested negative for covid 2 days ago when she was in Afib. Already had ED f/u with PCP. Patient denies higher acuity questions. Symptoms are improving. Recommended tylenol  for fever or body aches.   1. SYMPTOMS: What is the main symptom? (e.g., pain, redness, or swelling at injection site; feeling tired, fever, muscle aches)      Chills yesterday (resolved now), currently tired  2. ONSET: When was the vaccine (shot) given? How much later did the  begin? (e.g., hours, days ago)      Yesterday  3. SEVERITY: How bad is it?      Improving today  4. FEVER: Do you have a fever? If Yes, ask: What is your temperature, how was it measured, and when did it start?      Believes she had a fever last night  5. IMMUNIZATIONS GIVEN: What shots have you recently received?     Influenza  Protocols used: Immunization Reactions-A-AH

## 2024-06-09 NOTE — Progress Notes (Signed)
 Given weakness and low BP resp panel was ordered

## 2024-06-20 ENCOUNTER — Emergency Department
Admission: EM | Admit: 2024-06-20 | Discharge: 2024-06-20 | Disposition: A | Discharge status: Home / Self Care | Attending: Emergency Medicine | Admitting: Emergency Medicine

## 2024-06-20 ENCOUNTER — Emergency Department

## 2024-06-20 ENCOUNTER — Other Ambulatory Visit: Payer: Self-pay

## 2024-06-20 DIAGNOSIS — I509 Heart failure, unspecified: Secondary | ICD-10-CM | POA: Diagnosis not present

## 2024-06-20 DIAGNOSIS — R0789 Other chest pain: Secondary | ICD-10-CM | POA: Diagnosis present

## 2024-06-20 DIAGNOSIS — J449 Chronic obstructive pulmonary disease, unspecified: Secondary | ICD-10-CM | POA: Diagnosis not present

## 2024-06-20 DIAGNOSIS — I4891 Unspecified atrial fibrillation: Secondary | ICD-10-CM | POA: Diagnosis not present

## 2024-06-20 DIAGNOSIS — I110 Hypertensive heart disease with heart failure: Secondary | ICD-10-CM | POA: Diagnosis not present

## 2024-06-20 LAB — BASIC METABOLIC PANEL WITH GFR
Anion gap: 12 (ref 5–15)
BUN: 17 mg/dL (ref 8–23)
CO2: 22 mmol/L (ref 22–32)
Calcium: 8.8 mg/dL — ABNORMAL LOW (ref 8.9–10.3)
Chloride: 102 mmol/L (ref 98–111)
Creatinine, Ser: 1.17 mg/dL — ABNORMAL HIGH (ref 0.44–1.00)
GFR, Estimated: 49 mL/min — ABNORMAL LOW (ref >60)
Glucose, Bld: 88 mg/dL (ref 70–99)
Potassium: 3.9 mmol/L (ref 3.5–5.1)
Sodium: 136 mmol/L (ref 135–145)

## 2024-06-20 LAB — CBC
HCT: 41.4 % (ref 36.0–46.0)
Hemoglobin: 14.2 g/dL (ref 12.0–15.0)
MCH: 31.8 pg (ref 26.0–34.0)
MCHC: 34.3 g/dL (ref 30.0–36.0)
MCV: 92.8 fL (ref 80.0–100.0)
Platelets: 172 K/uL (ref 150–400)
RBC: 4.46 MIL/uL (ref 3.87–5.11)
RDW: 13.6 % (ref 11.5–15.5)
WBC: 6.2 K/uL (ref 4.0–10.5)
nRBC: 0 % (ref 0.0–0.2)

## 2024-06-20 LAB — MAGNESIUM: Magnesium: 2 mg/dL (ref 1.7–2.4)

## 2024-06-20 LAB — TROPONIN I (HIGH SENSITIVITY): Troponin I (High Sensitivity): 14 ng/L (ref <18)

## 2024-06-20 MED ORDER — METOPROLOL TARTRATE 5 MG/5ML IV SOLN
5.0000 mg | Freq: Once | INTRAVENOUS | Status: AC
  Administered 2024-06-20: 5 mg via INTRAVENOUS
  Filled 2024-06-20: qty 5

## 2024-06-20 MED ORDER — LACTATED RINGERS IV BOLUS
1000.0000 mL | Freq: Once | INTRAVENOUS | Status: AC
  Administered 2024-06-20: 1000 mL via INTRAVENOUS

## 2024-06-20 MED ORDER — POTASSIUM CHLORIDE CRYS ER 20 MEQ PO TBCR
40.0000 meq | EXTENDED_RELEASE_TABLET | Freq: Once | ORAL | Status: AC
  Administered 2024-06-20: 40 meq via ORAL
  Filled 2024-06-20: qty 2

## 2024-06-20 MED ORDER — METOPROLOL TARTRATE 25 MG PO TABS
25.0000 mg | ORAL_TABLET | Freq: Once | ORAL | Status: AC
  Administered 2024-06-20: 25 mg via ORAL
  Filled 2024-06-20: qty 1

## 2024-06-20 NOTE — ED Provider Notes (Signed)
 [facility] Provider Note    Event Date/Time   First MD Initiated Contact with Patient 06/20/24 1530     (approximate)   History   Chief Complaint Chest Pain   HPI  Kerly Rigsbee is a 73 y.o. female with past medical history of hypertension, hyperlipidemia, atrial fibrillation status post Watchman device, HFrEF, and COPD who presents to the ED complaining of chest pain.  Patient reports that she has been feeling generally weak and short of breath since waking up this morning with occasional aching discomfort in her chest.  She describes symptoms as similar to prior episodes of atrial fibrillation, contacted EMS and was found to have a heart rate in the 170s.  She does not take a blood thinner since having the Watchman device placed last year, states she has been compliant with her metoprolol  and other medications.  She was feeling well prior to today with no fevers, cough, nausea, vomiting, diarrhea, or dysuria.     Physical Exam   Triage Vital Signs: ED Triage Vitals  Encounter Vitals Group     BP 06/20/24 1520 (!) 116/90     Girls Systolic BP Percentile --      Girls Diastolic BP Percentile --      Boys Systolic BP Percentile --      Boys Diastolic BP Percentile --      Pulse Rate 06/20/24 1520 (!) 171     Resp 06/20/24 1520 18     Temp 06/20/24 1520 (!) 97.2 F (36.2 C)     Temp src --      SpO2 06/20/24 1520 100 %     Weight 06/20/24 1522 164 lb (74.4 kg)     Height 06/20/24 1522 5' 5 (1.651 m)     Head Circumference --      Peak Flow --      Pain Score 06/20/24 1522 5     Pain Loc --      Pain Education --      Exclude from Growth Chart --     Most recent vital signs: Vitals:   06/20/24 1724 06/20/24 1815  BP:  (!) 124/91  Pulse: (!) 109 (!) 101  Resp:  (!) 24  Temp:    SpO2:  100%    Constitutional: Alert and oriented. Eyes: Conjunctivae are normal. Head: Atraumatic. Nose: No congestion/rhinnorhea. Mouth/Throat: Mucous  membranes are moist.  Cardiovascular: Tachycardic, irregularly irregular rhythm. Grossly normal heart sounds.  2+ radial pulses bilaterally. Respiratory: Normal respiratory effort.  No retractions. Lungs CTAB. Gastrointestinal: Soft and nontender. No distention. Musculoskeletal: No lower extremity tenderness nor edema.  Neurologic:  Normal speech and language. No gross focal neurologic deficits are appreciated.    ED Results / Procedures / Treatments   Labs (all labs ordered are listed, but only abnormal results are displayed) Labs Reviewed  BASIC METABOLIC PANEL WITH GFR - Abnormal; Notable for the following components:      Result Value   Creatinine, Ser 1.17 (*)    Calcium  8.8 (*)    GFR, Estimated 49 (*)    All other components within normal limits  CBC  MAGNESIUM   TROPONIN I (HIGH SENSITIVITY)     EKG  ED ECG REPORT I, Carlin Palin, the attending physician, personally viewed and interpreted this ECG.   Date: 06/20/2024  EKG Time: 15:23  Rate: 168  Rhythm: atrial fibrillation  Axis: Normal  Intervals:none  ST&T Change: Inferolateral ST depressions, likely rate related  RADIOLOGY Chest  x-ray reviewed and interpreted by me with no infiltrate, edema, or effusion.  PROCEDURES:  Critical Care performed: No  Procedures   MEDICATIONS ORDERED IN ED: Medications  metoprolol  tartrate (LOPRESSOR ) injection 5 mg (5 mg Intravenous Given 06/20/24 1630)  potassium chloride  SA (KLOR-CON  M) CR tablet 40 mEq (40 mEq Oral Given 06/20/24 1637)  lactated ringers  bolus 1,000 mL (1,000 mLs Intravenous New Bag/Given 06/20/24 1639)  metoprolol  tartrate (LOPRESSOR ) injection 5 mg (5 mg Intravenous Given 06/20/24 1712)  metoprolol  tartrate (LOPRESSOR ) tablet 25 mg (25 mg Oral Given 06/20/24 1718)     IMPRESSION / MDM / ASSESSMENT AND PLAN / ED COURSE  I reviewed the triage vital signs and the nursing notes.                              73 y.o. female with past medical history of  hypertension, hyperlipidemia, atrial fibrillation status post watchman, CHF, and COPD who presents to the ED complaining of weakness, lightheadedness, shortness of breath, and chest discomfort since this morning.  Patient's presentation is most consistent with acute presentation with potential threat to life or bodily function.  Differential diagnosis includes, but is not limited to, atrial fibrillation, ACS, pneumonia, CHF exacerbation, anemia, electrolyte abnormality, AKI.  Patient nontoxic-appearing and in no acute distress, vital signs remarkable for tachycardia with heart rate varying from the 150s to the 170s.  EKG shows atrial fibrillation with RVR, rate related ST depressions noted.  BP remained stable at this time and patient is not in any respiratory distress, maintaining oxygen  saturations at 100% on room air.  Chest x-ray is unremarkable, lab results are pending at this time.  We will give IV metoprolol  for rate control and reassess.  Labs are reassuring without significant anemia, leukocytosis, electrolyte abnormality, or AKI.  Troponin within normal limits and I doubt ACS, heart rate significantly improved following IV and oral metoprolol .  Patient able to ambulate here in the ED without difficulty, denies significant ongoing chest pain, shortness of breath, or dizziness.  She has follow-up scheduled with cardiology in 2 days and is appropriate for discharge home, was counseled to return to the ED for new or worsening symptoms.  Patient agrees with plan.       FINAL CLINICAL IMPRESSION(S) / ED DIAGNOSES   Final diagnoses:  Atrial fibrillation with RVR (HCC)     Rx / DC Orders   ED Discharge Orders     None        Note:  This document was prepared using Dragon voice recognition software and may include unintentional dictation errors.   Willo Dunnings, MD 06/20/24 TRENNA

## 2024-06-20 NOTE — ED Triage Notes (Signed)
 Pt comes in from home via [facility] with complaints of chest pain,dizziness and shortness of breath that started this morning. Pt complains of pain 5/10 at this time in her chest. Pt was in AFIB with EMS, and has a HR of 171 in triage.  Pt is alert and oriented x4.

## 2024-06-21 ENCOUNTER — Encounter: Admission: RE | Payer: Self-pay | Source: Home / Self Care

## 2024-06-21 ENCOUNTER — Ambulatory Visit: Admission: RE | Admit: 2024-06-21 | Source: Home / Self Care | Admitting: Gastroenterology

## 2024-06-21 SURGERY — COLONOSCOPY
Anesthesia: General

## 2024-06-26 ENCOUNTER — Ambulatory Visit: Admission: RE | Admit: 2024-06-26 | Source: Home / Self Care | Admitting: Cardiology

## 2024-06-26 ENCOUNTER — Other Ambulatory Visit: Payer: Self-pay

## 2024-06-26 ENCOUNTER — Encounter: Admission: RE | Payer: Self-pay | Source: Home / Self Care

## 2024-06-26 SURGERY — CARDIOVERSION
Anesthesia: General

## 2024-06-27 ENCOUNTER — Other Ambulatory Visit: Payer: Self-pay

## 2024-06-28 MED ORDER — SODIUM CHLORIDE 0.9 % IV SOLN
INTRAVENOUS | Status: DC
  Administered 2024-06-29: 250 mL via INTRAVENOUS

## 2024-06-29 ENCOUNTER — Ambulatory Visit: Admitting: Anesthesiology

## 2024-06-29 ENCOUNTER — Inpatient Hospital Stay
Admission: AD | Admit: 2024-06-29 | Discharge: 2024-07-01 | DRG: 309 | Disposition: A | Discharge status: Home / Self Care | Attending: Internal Medicine | Admitting: Internal Medicine

## 2024-06-29 ENCOUNTER — Encounter
Admission: AD | Disposition: A | Discharge status: Home / Self Care | Payer: Self-pay | Source: Home / Self Care | Attending: Internal Medicine

## 2024-06-29 ENCOUNTER — Other Ambulatory Visit: Payer: Self-pay

## 2024-06-29 ENCOUNTER — Encounter: Payer: Self-pay | Admitting: Cardiology

## 2024-06-29 ENCOUNTER — Ambulatory Visit
Admission: RE | Admit: 2024-06-29 | Discharge: 2024-06-29 | Disposition: A | Discharge status: Home / Self Care | Source: Home / Self Care | Attending: Student | Admitting: Student

## 2024-06-29 DIAGNOSIS — E739 Lactose intolerance, unspecified: Secondary | ICD-10-CM | POA: Diagnosis present

## 2024-06-29 DIAGNOSIS — F329 Major depressive disorder, single episode, unspecified: Secondary | ICD-10-CM | POA: Diagnosis present

## 2024-06-29 DIAGNOSIS — Z79899 Other long term (current) drug therapy: Secondary | ICD-10-CM

## 2024-06-29 DIAGNOSIS — I110 Hypertensive heart disease with heart failure: Secondary | ICD-10-CM | POA: Diagnosis present

## 2024-06-29 DIAGNOSIS — F32A Depression, unspecified: Secondary | ICD-10-CM | POA: Diagnosis present

## 2024-06-29 DIAGNOSIS — I4819 Other persistent atrial fibrillation: Secondary | ICD-10-CM | POA: Diagnosis present

## 2024-06-29 DIAGNOSIS — F17210 Nicotine dependence, cigarettes, uncomplicated: Secondary | ICD-10-CM | POA: Diagnosis present

## 2024-06-29 DIAGNOSIS — Z885 Allergy status to narcotic agent status: Secondary | ICD-10-CM | POA: Diagnosis not present

## 2024-06-29 DIAGNOSIS — J439 Emphysema, unspecified: Secondary | ICD-10-CM | POA: Diagnosis present

## 2024-06-29 DIAGNOSIS — Z7901 Long term (current) use of anticoagulants: Secondary | ICD-10-CM | POA: Diagnosis not present

## 2024-06-29 DIAGNOSIS — I480 Paroxysmal atrial fibrillation: Principal | ICD-10-CM | POA: Diagnosis present

## 2024-06-29 DIAGNOSIS — G4733 Obstructive sleep apnea (adult) (pediatric): Secondary | ICD-10-CM | POA: Diagnosis present

## 2024-06-29 DIAGNOSIS — E785 Hyperlipidemia, unspecified: Secondary | ICD-10-CM | POA: Diagnosis present

## 2024-06-29 DIAGNOSIS — Z7951 Long term (current) use of inhaled steroids: Secondary | ICD-10-CM

## 2024-06-29 DIAGNOSIS — I4891 Unspecified atrial fibrillation: Secondary | ICD-10-CM | POA: Diagnosis present

## 2024-06-29 DIAGNOSIS — F419 Anxiety disorder, unspecified: Secondary | ICD-10-CM | POA: Diagnosis present

## 2024-06-29 DIAGNOSIS — Z95818 Presence of other cardiac implants and grafts: Secondary | ICD-10-CM

## 2024-06-29 DIAGNOSIS — I4892 Unspecified atrial flutter: Secondary | ICD-10-CM | POA: Diagnosis present

## 2024-06-29 DIAGNOSIS — Z888 Allergy status to other drugs, medicaments and biological substances status: Secondary | ICD-10-CM

## 2024-06-29 DIAGNOSIS — Z604 Social exclusion and rejection: Secondary | ICD-10-CM | POA: Diagnosis present

## 2024-06-29 DIAGNOSIS — I252 Old myocardial infarction: Secondary | ICD-10-CM

## 2024-06-29 DIAGNOSIS — Z7984 Long term (current) use of oral hypoglycemic drugs: Secondary | ICD-10-CM

## 2024-06-29 DIAGNOSIS — J449 Chronic obstructive pulmonary disease, unspecified: Secondary | ICD-10-CM | POA: Diagnosis present

## 2024-06-29 DIAGNOSIS — I5042 Chronic combined systolic (congestive) and diastolic (congestive) heart failure: Secondary | ICD-10-CM | POA: Diagnosis present

## 2024-06-29 DIAGNOSIS — Z9049 Acquired absence of other specified parts of digestive tract: Secondary | ICD-10-CM | POA: Diagnosis not present

## 2024-06-29 DIAGNOSIS — I513 Intracardiac thrombosis, not elsewhere classified: Secondary | ICD-10-CM | POA: Diagnosis present

## 2024-06-29 DIAGNOSIS — I10 Essential (primary) hypertension: Secondary | ICD-10-CM | POA: Diagnosis present

## 2024-06-29 DIAGNOSIS — F17200 Nicotine dependence, unspecified, uncomplicated: Secondary | ICD-10-CM | POA: Diagnosis present

## 2024-06-29 HISTORY — PX: CARDIOVERSION: SHX1299

## 2024-06-29 HISTORY — PX: TEE WITHOUT CARDIOVERSION: SHX5443

## 2024-06-29 LAB — CBC
HCT: 44.3 % (ref 36.0–46.0)
Hemoglobin: 15 g/dL (ref 12.0–15.0)
MCH: 31.7 pg (ref 26.0–34.0)
MCHC: 33.9 g/dL (ref 30.0–36.0)
MCV: 93.7 fL (ref 80.0–100.0)
Platelets: 224 K/uL (ref 150–400)
RBC: 4.73 MIL/uL (ref 3.87–5.11)
RDW: 14.1 % (ref 11.5–15.5)
WBC: 5.4 K/uL (ref 4.0–10.5)
nRBC: 0 % (ref 0.0–0.2)

## 2024-06-29 LAB — BASIC METABOLIC PANEL WITH GFR
Anion gap: 9 (ref 5–15)
Anion gap: 8 (ref 5–15)
BUN: 18 mg/dL (ref 8–23)
BUN: 19 mg/dL (ref 8–23)
CO2: 24 mmol/L (ref 22–32)
CO2: 27 mmol/L (ref 22–32)
Calcium: 8.7 mg/dL — ABNORMAL LOW (ref 8.9–10.3)
Calcium: 8.6 mg/dL — ABNORMAL LOW (ref 8.9–10.3)
Chloride: 104 mmol/L (ref 98–111)
Chloride: 102 mmol/L (ref 98–111)
Creatinine, Ser: 1.39 mg/dL — ABNORMAL HIGH (ref 0.44–1.00)
Creatinine, Ser: 1.4 mg/dL — ABNORMAL HIGH (ref 0.44–1.00)
GFR, Estimated: 40 mL/min — ABNORMAL LOW (ref >60)
GFR, Estimated: 39 mL/min — ABNORMAL LOW (ref >60)
Glucose, Bld: 78 mg/dL (ref 70–99)
Glucose, Bld: 108 mg/dL — ABNORMAL HIGH (ref 70–99)
Potassium: 4.6 mmol/L (ref 3.5–5.1)
Potassium: 4.1 mmol/L (ref 3.5–5.1)
Sodium: 137 mmol/L (ref 135–145)
Sodium: 137 mmol/L (ref 135–145)

## 2024-06-29 LAB — APTT
aPTT: 35 s (ref 24–36)
aPTT: 158 s — ABNORMAL HIGH (ref 24–36)

## 2024-06-29 LAB — HEPARIN LEVEL (UNFRACTIONATED)
Heparin Unfractionated: >1.1 [IU]/mL — ABNORMAL HIGH (ref 0.30–0.70)
Heparin Unfractionated: >1.1 [IU]/mL — ABNORMAL HIGH (ref 0.30–0.70)

## 2024-06-29 LAB — PROTIME-INR
INR: 1.5 — ABNORMAL HIGH (ref 0.8–1.2)
Prothrombin Time: 18.5 s — ABNORMAL HIGH (ref 11.4–15.2)

## 2024-06-29 LAB — MAGNESIUM: Magnesium: 2.4 mg/dL (ref 1.7–2.4)

## 2024-06-29 SURGERY — ECHOCARDIOGRAM, TRANSESOPHAGEAL
Anesthesia: General

## 2024-06-29 MED ORDER — LIDOCAINE HCL (CARDIAC) PF 100 MG/5ML IV SOSY
PREFILLED_SYRINGE | INTRAVENOUS | Status: DC | PRN
Start: 2024-06-29 — End: 2024-06-29
  Administered 2024-06-29: 50 mg via INTRAVENOUS

## 2024-06-29 MED ORDER — BUPROPION HCL ER (XL) 150 MG PO TB24
150.0000 mg | ORAL_TABLET | Freq: Every day | ORAL | Status: DC
  Administered 2024-06-29 – 2024-07-01 (×3): 150 mg via ORAL
  Filled 2024-06-29 (×3): qty 1

## 2024-06-29 MED ORDER — DIGOXIN 0.25 MG/ML IJ SOLN
0.5000 mg | Freq: Once | INTRAMUSCULAR | Status: AC
  Administered 2024-06-29: 0.5 mg via INTRAVENOUS
  Filled 2024-06-29 (×2): qty 2

## 2024-06-29 MED ORDER — HEPARIN (PORCINE) 25000 UT/250ML-% IV SOLN
1150.0000 [IU]/h | INTRAVENOUS | Status: DC
  Administered 2024-06-29: 1150 [IU]/h via INTRAVENOUS
  Filled 2024-06-29 (×2): qty 250

## 2024-06-29 MED ORDER — COLESTIPOL HCL 1 G PO TABS: 1.0000 g | ORAL_TABLET | Freq: Every day | ORAL | Status: DC | PRN

## 2024-06-29 MED ORDER — SERTRALINE HCL 50 MG PO TABS
50.0000 mg | ORAL_TABLET | Freq: Every day | ORAL | Status: DC
  Administered 2024-06-29 – 2024-06-30 (×2): 50 mg via ORAL
  Filled 2024-06-29 (×2): qty 1

## 2024-06-29 MED ORDER — DIGOXIN 0.25 MG/ML IJ SOLN
0.2500 mg | Freq: Once | INTRAMUSCULAR | Status: AC
  Administered 2024-06-30: 0.25 mg via INTRAVENOUS
  Filled 2024-06-29 (×2): qty 2

## 2024-06-29 MED ORDER — MONTELUKAST SODIUM 10 MG PO TABS
10.0000 mg | ORAL_TABLET | Freq: Every day | ORAL | Status: DC
  Administered 2024-06-29 – 2024-06-30 (×2): 10 mg via ORAL
  Filled 2024-06-29 (×2): qty 1

## 2024-06-29 MED ORDER — PHENYLEPHRINE HCL (PRESSORS) 10 MG/ML IV SOLN
INTRAVENOUS | Status: DC | PRN
  Administered 2024-06-29: 80 ug via INTRAVENOUS
  Administered 2024-06-29: 160 ug via INTRAVENOUS
  Administered 2024-06-29: 80 ug via INTRAVENOUS

## 2024-06-29 MED ORDER — IPRATROPIUM-ALBUTEROL 0.5-2.5 (3) MG/3ML IN SOLN: 3.0000 mL | Freq: Every day | RESPIRATORY_TRACT | Status: DC

## 2024-06-29 MED ORDER — HEPARIN BOLUS VIA INFUSION
4300.0000 [IU] | Freq: Once | INTRAVENOUS | Status: AC
  Filled 2024-06-29: qty 4300

## 2024-06-29 MED ORDER — HEPARIN (PORCINE) 25000 UT/250ML-% IV SOLN
INTRAVENOUS | Status: AC
  Administered 2024-06-29: 4300 [IU] via INTRAVENOUS
  Filled 2024-06-29: qty 250

## 2024-06-29 MED ORDER — EMPAGLIFLOZIN 10 MG PO TABS
10.0000 mg | ORAL_TABLET | Freq: Every morning | ORAL | Status: DC
  Administered 2024-06-30 – 2024-07-01 (×2): 10 mg via ORAL
  Filled 2024-06-29 (×2): qty 1

## 2024-06-29 MED ORDER — NICOTINE 14 MG/24HR TD PT24
14.0000 mg | MEDICATED_PATCH | Freq: Every day | TRANSDERMAL | Status: DC
Start: 2024-06-30 — End: 2024-07-01
  Filled 2024-06-29 (×2): qty 1

## 2024-06-29 MED ORDER — FLUTICASONE FUROATE-VILANTEROL 200-25 MCG/ACT IN AEPB
1.0000 | INHALATION_SPRAY | Freq: Every day | RESPIRATORY_TRACT | Status: DC
  Filled 2024-06-29: qty 28

## 2024-06-29 MED ORDER — METOPROLOL SUCCINATE ER 50 MG PO TB24
50.0000 mg | ORAL_TABLET | Freq: Two times a day (BID) | ORAL | Status: DC
Start: 2024-06-30 — End: 2024-06-30

## 2024-06-29 MED ORDER — DIGOXIN 0.25 MG/ML IJ SOLN
0.2500 mg | Freq: Once | INTRAMUSCULAR | Status: AC
  Administered 2024-06-29: 0.25 mg via INTRAVENOUS
  Filled 2024-06-29 (×2): qty 2

## 2024-06-29 MED ORDER — CYCLOSPORINE 0.1 % OP SOLN: 1.0000 [drp] | Freq: Every day | OPHTHALMIC | Status: DC

## 2024-06-29 MED ORDER — PROPOFOL 10 MG/ML IV BOLUS
INTRAVENOUS | Status: DC | PRN
  Administered 2024-06-29: 20 mg via INTRAVENOUS
  Administered 2024-06-29: 80 mg via INTRAVENOUS
  Administered 2024-06-29 (×2): 30 mg via INTRAVENOUS

## 2024-06-29 NOTE — H&P (Addendum)
 History and Physical    Stephanie [name] FMW:[id number] DOB: 28-Nov-1950 DOA: 06/29/2024  DOS: the patient was seen and examined on 06/29/2024  PCP: Watt Mirza, MD   Patient coming from: Clinic  I have personally briefly reviewed patient's old medical records in [facility] and CareEverywhere  HPI:   Stephanie [name] is a 73 y.o. year old female with past medical history of HTN, HLD, DDD, COPD presenting to the hospital after going to get an ED to get an ablation. She was noted to have LA thrombus. S/p April 2025 ablation and watchman procedure.   Pt states she was not having any complaints prior to coming in.  She was coming for a TEE as she was not A-fib and consideration was for ablation.  On review of system patient denies any complaints such as chest pain, shortness of breath, palpitations but states that with activity she can get lightheadedness.  TRH contacted for admission.  Review of Systems: As mentioned in the history of present illness. All other systems reviewed and are negative.    Past Medical History:  Diagnosis Date   Anxiety and depression 05/24/2015   Chronic combined systolic and diastolic heart failure (HCC) 11/27/2023   Diverticulosis    History of non-ST elevation myocardial infarction (NSTEMI) 01/05/2024   Hyperlipidemia    Hypertension    Paroxysmal A-fib (HCC) 05/24/2015    Past Surgical History:  Procedure Laterality Date   CARDIOVERSION N/A 05/12/2018   Procedure: CARDIOVERSION;  Surgeon: Hester Wolm PARAS, MD;  Location: [facility];  Service: Cardiovascular;  Laterality: N/A;   CARDIOVERSION N/A 06/19/2021   Procedure: CARDIOVERSION;  Surgeon: Florencio Cara BIRCH, MD;  Location: [facility];  Service: Cardiovascular;  Laterality: N/A;   CARDIOVERSION N/A 12/06/2023   Procedure: CARDIOVERSION;  Surgeon: Florencio Cara BIRCH, MD;  Location: [facility];  Service: Cardiovascular;  Laterality: N/A;   CHOLECYSTECTOMY  2004   COLONOSCOPY WITH PROPOFOL  N/A  05/24/2019   Procedure: COLONOSCOPY WITH PROPOFOL ;  Surgeon: Toledo, Ladell POUR, MD;  Location: [facility];  Service: Gastroenterology;  Laterality: N/A;   ESOPHAGOGASTRODUODENOSCOPY (EGD) WITH PROPOFOL  N/A 05/24/2019   Procedure: ESOPHAGOGASTRODUODENOSCOPY (EGD) WITH PROPOFOL ;  Surgeon: Toledo, Ladell POUR, MD;  Location: [facility];  Service: Gastroenterology;  Laterality: N/A;   KNEE SURGERY     KNEE SURGERY     LEFT HEART CATH AND CORONARY ANGIOGRAPHY N/A 08/23/2023   Procedure: LEFT HEART CATH AND CORONARY ANGIOGRAPHY;  Surgeon: Florencio Cara BIRCH, MD;  Location: [facility];  Service: Cardiovascular;  Laterality: N/A;   TEE WITHOUT CARDIOVERSION N/A 05/12/2018   Procedure: TRANSESOPHAGEAL ECHOCARDIOGRAM (TEE);  Surgeon: Hester Wolm PARAS, MD;  Location: [facility];  Service: Cardiovascular;  Laterality: N/A;   TOE SURGERY       Allergies  Allergen Reactions   Codeine Other (See Comments)    Cannot tolerate alone, only when mixed with another medication. Makes patient feel spaced out. Patient has use codeine combined with other medications.   Lactulose Diarrhea   Lactose Intolerance (Gi)    Phenazopyridine Hcl Hives    Family History  Problem Relation Age of Onset   Breast cancer Neg Hx     Prior to Admission medications   Medication Sig Start Date End Date Taking? Authorizing Provider  acetaminophen  (TYLENOL ) 500 MG tablet Take 500 mg by mouth every 4 (four) hours as needed (pain). 06/24/23  Yes [provider]  albuterol  (VENTOLIN  HFA) 108 (90 Base) MCG/ACT inhaler Inhale 2 puffs into the lungs every  6 (six) hours as needed for wheezing or shortness of breath. 11/17/23  Yes Cyrena Mylar, MD  apixaban  (ELIQUIS ) 5 MG TABS tablet Take 5 mg by mouth 2 (two) times daily.   Yes [provider]  Ascorbic Acid  (VITAMIN C  PO) Take 1 tablet by mouth at bedtime.   Yes [provider]  buPROPion  (WELLBUTRIN  XL) 150 MG 24 hr tablet Take 1 tablet (150 mg  total) by mouth daily. Patient taking differently: Take 150 mg by mouth at bedtime. 03/29/24  Yes Copland, Jacques, MD  calcium  citrate-vitamin D  (CITRACAL+D) 315-200 MG-UNIT per tablet Take 1 tablet by mouth at bedtime.   Yes [provider]  colestipol  (COLESTID ) 1 g tablet Take 1 g by mouth daily as needed (diarrhea). 06/15/22  Yes [provider]  empagliflozin (JARDIANCE) 10 MG TABS tablet Take 10 mg by mouth in the morning. 02/28/24 02/27/25 Yes [provider]  fluticasone-salmeterol (ADVAIR) 250-50 MCG/ACT AEPB Inhale 1 puff into the lungs in the morning and at bedtime. 03/09/24 03/09/25 Yes [provider]  ipratropium-albuterol  (DUONEB) 0.5-2.5 (3) MG/3ML SOLN Take 3 mLs by nebulization daily in the afternoon.   Yes [provider]  losartan  (COZAAR ) 25 MG tablet Take 25 mg by mouth in the morning.   Yes [provider]  metoprolol  succinate (TOPROL -XL) 50 MG 24 hr tablet Take 50 mg by mouth 2 (two) times daily. 03/07/24 03/07/25 Yes [provider]  montelukast (SINGULAIR) 10 MG tablet Take 10 mg by mouth at bedtime. 01/19/24 01/18/25 Yes [provider]  sertraline  (ZOLOFT ) 50 MG tablet Take 50 mg by mouth at bedtime.   Yes [provider]  Spacer/Aero-Holding Chambers (AEROCHAMBER MV) inhaler Use as instructed 11/17/23   Cyrena Mylar, MD  spironolactone (ALDACTONE) 25 MG tablet Take 12.5 mg by mouth in the morning. Patient not taking: Reported on 06/29/2024 03/22/24 03/22/25  [provider]  VEVYE 0.1 % SOLN Place 1 drop into both eyes daily. Patient not taking: Reported on 06/29/2024 11/09/23   [provider]      reports that she has been smoking cigarettes. She has a 40 pack-year smoking history. She has never used smokeless tobacco. She reports that she does not currently use alcohol . She reports that she does not use drugs. Lives with by herself.  Currently retired. Worked at a bank.  Tobacco- 1  ppd, smoking since age 30.  EtOH- Denies use Illicit drug use- denies use.  IADLs/ADLs- can person independently at baseline    Physical Exam: Vitals:   06/29/24 1545 06/29/24 1600 06/29/24 1644 06/29/24 2046  BP: 105/73 (!) 110/51 100/67 102/70  Pulse: 97 91 95 62  Resp: 20 (!) 36 20 18  Temp:   (!) 97.5 F (36.4 C) 97.8 F (36.6 C)  TempSrc:   Oral   SpO2: 100% 97% 100% 99%  Weight:      Height:         Physical Exam General: NAD HENT: NCAT Lungs: CTAB, no wheeze, rhonchi or rales.  Cardiovascular: IRIR, no r/m/g, 2+ pulses in all extremities. No LE edema Abdomen: No TTP, normal bowel sounds MSK: No asymmetry or muscle atrophy.  Skin: no lesions noted on exposed skin Neuro: Alert and oriented x4. CN grossly intact Psych: Normal mood and normal affect   Labs on Admission: I have personally reviewed following labs and imaging studies  CBC: Recent Labs  Lab 06/29/24 1432  WBC 5.4  HGB 15.0  HCT 44.3  MCV 93.7  PLT 224   Basic Metabolic Panel: Recent Labs  Lab 06/29/24 1507 06/29/24 1739  NA 137 137  K 4.6 4.1  CL 104 102  CO2 24 27  GLUCOSE 78 108*  BUN 18 19  CREATININE 1.39* 1.40*  CALCIUM  8.7* 8.6*  MG  --  2.4   GFR: Estimated Creatinine Clearance: 35.1 mL/min (A) (by C-G formula based on SCr of 1.4 mg/dL (H)). Liver Function Tests: No results for input(s): AST, ALT, ALKPHOS, BILITOT, PROT, ALBUMIN in the last 168 hours. No results for input(s): LIPASE, AMYLASE in the last 168 hours. No results for input(s): AMMONIA in the last 168 hours. Coagulation Profile: Recent Labs  Lab 06/29/24 1432  INR 1.5*   Cardiac Enzymes: No results for input(s): CKTOTAL, CKMB, CKMBINDEX, TROPONINI, TROPONINIHS in the last 168 hours. BNP (last 3 results) Recent Labs    09/29/23 1408 11/11/23 1156 11/27/23 1149  BNP 350.4* 941.9* 1,072.2*   HbA1C: No results for input(s): HGBA1C in the last 72 hours. CBG: No results for  input(s): GLUCAP in the last 168 hours. Lipid Profile: No results for input(s): CHOL, HDL, LDLCALC, TRIG, CHOLHDL, LDLDIRECT in the last 72 hours. Thyroid  Function Tests: No results for input(s): TSH, T4TOTAL, FREET4, T3FREE, THYROIDAB in the last 72 hours. Anemia Panel: No results for input(s): VITAMINB12, FOLATE, FERRITIN, TIBC, IRON, RETICCTPCT in the last 72 hours. Urine analysis:    Component Value Date/Time   COLORURINE YELLOW (A) 05/24/2024 1949   APPEARANCEUR CLEAR (A) 05/24/2024 1949   LABSPEC 1.005 05/24/2024 1949   PHURINE 6.0 05/24/2024 1949   GLUCOSEU >=500 (A) 05/24/2024 1949   HGBUR NEGATIVE 05/24/2024 1949   BILIRUBINUR NEGATIVE 05/24/2024 1949   KETONESUR NEGATIVE 05/24/2024 1949   PROTEINUR NEGATIVE 05/24/2024 1949   NITRITE NEGATIVE 05/24/2024 1949   LEUKOCYTESUR NEGATIVE 05/24/2024 1949    Radiological Exams on Admission: I have personally reviewed images No results found.    Assessment/Plan Principal Problem:   Left atrial thrombus Active Problems:   Paroxysmal A-fib (HCC)   Atrial fibrillation with RVR (HCC)   Essential hypertension   Tobacco use disorder   COPD (chronic obstructive pulmonary disease) (HCC)   OSA (obstructive sleep apnea)   Left atrial thrombus: Noted during TEE.  Admitted for anticoagulation with IV heparin  and rate control given A-fib with RVR.  Was recently started on amiodarone  but will hold that given we do not want to convert him to sinus rhythm.  Per cardiology we will start IV heparin  and start her on digoxin.  Appreciate their recommendations.  Once RVR is resolved patient is feeling better, we can convert her to a DOAC.  Continue to monitor her on telemetry. Can use IV metoprolol  prn for HR>110.   Afib with RVR: Improving. pt with hx of afib noted to be in RVR after TTE. TEE also noted a thrombus. See above. Per cardiology will start digoxin. Will resume home beta blocker. Can use IV  metoprolol  as needed. BP on softer side so patient admitted to progressive LOC. K and Magnesium  checked with goal of K>4 and Mag>2. No symptoms of chest pain or dyspnea.    Chronic Problems: HTN: holding ARB, spironolcatone given hypotensive blood pressures. Will resume beta blocker.  COPD: On advair OP. Started breo ellipta here. Duonebs prn MDD: continue home zoloft  Tobacco use disorder: Start NRT OSA: restart nightly CPAP.  CKD: renally dose meds, avoid nephrotoxic agents and monitor serial creatinine.   VTE prophylaxis:  IV heparin  gtts  Diet: Access: Lines:  Code Status:  Full Code Telemetry:  Admission status: Inpatient, Progressive Patient is from: Home  Anticipated d/c is to: Home  Anticipated d/c is in: 3 days    Family Communication: Updated at bedside   Consults called: Cardiology    Severity of Illness: The appropriate patient status for this patient is INPATIENT. Inpatient status is judged to be reasonable and necessary in order to provide the required intensity of service to ensure the patient's safety. The patient's presenting symptoms, physical exam findings, and initial radiographic and laboratory data in the context of their chronic comorbidities is felt to place them at high risk for further clinical deterioration. Furthermore, it is not anticipated that the patient will be medically stable for discharge from the hospital within 2 midnights of admission.   * I certify that at the point of admission it is my clinical judgment that the patient will require inpatient hospital care spanning beyond 2 midnights from the point of admission due to high intensity of service, high risk for further deterioration and high frequency of surveillance required.DEWAINE Morene Bathe, MD Jolynn DEL. [facility]

## 2024-06-29 NOTE — Progress Notes (Signed)
*  PRELIMINARY RESULTS* Echocardiogram Echocardiogram Transesophageal has been performed.  [name] 06/29/2024, 1:55 PM

## 2024-06-29 NOTE — Anesthesia Postprocedure Evaluation (Signed)
 Anesthesia Post Note  Patient: Devanny Palecek  Procedure(s) Performed: ECHOCARDIOGRAM, TRANSESOPHAGEAL CARDIOVERSION  Patient location during evaluation: PACU Anesthesia Type: General Level of consciousness: awake and alert, oriented and patient cooperative Pain management: pain level controlled Vital Signs Assessment: post-procedure vital signs reviewed and stable Respiratory status: spontaneous breathing, nonlabored ventilation and respiratory function stable Cardiovascular status: blood pressure returned to baseline and stable Postop Assessment: adequate PO intake Anesthetic complications: no   No notable events documented.   Last Vitals:  Vitals:   06/29/24 1348 06/29/24 1355  BP:  113/87  Pulse: (!) 106 (!) 113  Resp: (!) 22 16  Temp:    SpO2: 99% 99%    Last Pain:  Vitals:   06/29/24 1355  TempSrc:   PainSc: Asleep                 Alfonso Ruths

## 2024-06-29 NOTE — Progress Notes (Signed)
*  PRELIMINARY RESULTS* Echocardiogram Echocardiogram Transesophageal has been performed.  Stephanie [name] 06/29/2024, 2:01 PM

## 2024-06-29 NOTE — Consult Note (Signed)
 [facility] CLINIC CARDIOLOGY CONSULT NOTE       Patient ID: Stephanie [name] MRN: [id number] DOB/AGE: 75-May-1952 75 y.o.  Admit date: 06/29/2024 Referring Physician None Primary Physician Watt Mirza, MD  Primary Cardiologist Tinnie Maiden, NP Reason for Consultation AF RVR, LAA thrombus  HPI: Stephanie [name] is a 73 y.o. female  with a past medical history of persistent atrial fibrillation s/p Watchman 05/2022, chronic HFmrEF, hypertension, hyperlipidemia, OSA on CPAP, former tobacco use, pulmonary emphysema who is being admitted on 06/29/2024 for AF RVR, LAA thrombus noted on TEE requiring IV anticoagulation.   Outpatient TEE today demonstrated large left atrial appendage thrombus. Per Dr. Wilburn, he recommended admission for IV heparin  and improvement in rate control therapy. Recent lab work notable for creatinine 1.17, potassium 3.9, hemoglobin 14.2, WBC 6.2. EKG in specials atrial flutter rate 117 bpm.  At the time of my evaluation this afternoon, she is resting comfortably in hospital bed. States that recently she has been experiencing SOB but denies any LE edema. Also denies any chest pain. Reports that can tell her heart is beating fast and has palpitations at times. Endorses lightheadedness and dizziness.   Review of systems complete and found to be negative unless listed above    Past Medical History:  Diagnosis Date   Anxiety and depression 05/24/2015   Chronic combined systolic and diastolic heart failure (HCC) 11/27/2023   Diverticulosis    History of non-ST elevation myocardial infarction (NSTEMI) 01/05/2024   Hyperlipidemia    Hypertension    Paroxysmal A-fib (HCC) 05/24/2015    Past Surgical History:  Procedure Laterality Date   CARDIOVERSION N/A 05/12/2018   Procedure: CARDIOVERSION;  Surgeon: Hester Wolm PARAS, MD;  Location: [facility] ORS;  Service: Cardiovascular;  Laterality: N/A;   CARDIOVERSION N/A 06/19/2021   Procedure: CARDIOVERSION;  Surgeon: Florencio Cara BIRCH, MD;  Location: [facility] ORS;  Service: Cardiovascular;  Laterality: N/A;   CARDIOVERSION N/A 12/06/2023   Procedure: CARDIOVERSION;  Surgeon: Florencio Cara BIRCH, MD;  Location: [facility] ORS;  Service: Cardiovascular;  Laterality: N/A;   CHOLECYSTECTOMY  2004   COLONOSCOPY WITH PROPOFOL  N/A 05/24/2019   Procedure: COLONOSCOPY WITH PROPOFOL ;  Surgeon: Toledo, Ladell POUR, MD;  Location: [facility] ENDOSCOPY;  Service: Gastroenterology;  Laterality: N/A;   ESOPHAGOGASTRODUODENOSCOPY (EGD) WITH PROPOFOL  N/A 05/24/2019   Procedure: ESOPHAGOGASTRODUODENOSCOPY (EGD) WITH PROPOFOL ;  Surgeon: Toledo, Ladell POUR, MD;  Location: [facility] ENDOSCOPY;  Service: Gastroenterology;  Laterality: N/A;   KNEE SURGERY     KNEE SURGERY     LEFT HEART CATH AND CORONARY ANGIOGRAPHY N/A 08/23/2023   Procedure: LEFT HEART CATH AND CORONARY ANGIOGRAPHY;  Surgeon: Florencio Cara BIRCH, MD;  Location: [facility] INVASIVE CV LAB;  Service: Cardiovascular;  Laterality: N/A;   TEE WITHOUT CARDIOVERSION N/A 05/12/2018   Procedure: TRANSESOPHAGEAL ECHOCARDIOGRAM (TEE);  Surgeon: Hester Wolm PARAS, MD;  Location: [facility] ORS;  Service: Cardiovascular;  Laterality: N/A;   TOE SURGERY      Medications Prior to Admission  Medication Sig Dispense Refill Last Dose/Taking   acetaminophen  (TYLENOL ) 500 MG tablet Take 500 mg by mouth every 4 (four) hours as needed (pain).   Taking As Needed   albuterol  (VENTOLIN  HFA) 108 (90 Base) MCG/ACT inhaler Inhale 2 puffs into the lungs every 6 (six) hours as needed for wheezing or shortness of breath. 8 g 2 Taking As Needed   amiodarone  (PACERONE ) 200 MG tablet Take 200 mg by mouth 2 (two) times daily.   06/29/2024   apixaban  (ELIQUIS ) 5 MG TABS tablet Take 5  mg by mouth 2 (two) times daily.   06/29/2024   Ascorbic Acid  (VITAMIN C  PO) Take 1 tablet by mouth at bedtime.   06/28/2024   buPROPion  (WELLBUTRIN  XL) 150 MG 24 hr tablet Take 1 tablet (150 mg total) by mouth daily. (Patient taking differently: Take 150 mg by mouth at  bedtime.) 30 tablet 3 06/28/2024   calcium  citrate-vitamin D  (CITRACAL+D) 315-200 MG-UNIT per tablet Take 1 tablet by mouth at bedtime.   06/28/2024   colestipol  (COLESTID ) 1 g tablet Take 1 g by mouth daily as needed (diarrhea).   Taking As Needed   empagliflozin (JARDIANCE) 10 MG TABS tablet Take 10 mg by mouth in the morning.   06/29/2024   fluticasone-salmeterol (ADVAIR) 250-50 MCG/ACT AEPB Inhale 1 puff into the lungs in the morning and at bedtime.   Past Week   ipratropium-albuterol  (DUONEB) 0.5-2.5 (3) MG/3ML SOLN Take 3 mLs by nebulization daily in the afternoon.   Taking   losartan  (COZAAR ) 25 MG tablet Take 25 mg by mouth in the morning.   06/29/2024   metoprolol  succinate (TOPROL -XL) 50 MG 24 hr tablet Take 50 mg by mouth 2 (two) times daily.   06/29/2024   montelukast (SINGULAIR) 10 MG tablet Take 10 mg by mouth at bedtime.   06/28/2024   sertraline  (ZOLOFT ) 50 MG tablet Take 50 mg by mouth at bedtime.   06/28/2024   Spacer/Aero-Holding Chambers (AEROCHAMBER MV) inhaler Use as instructed 1 each 0    spironolactone (ALDACTONE) 25 MG tablet Take 12.5 mg by mouth in the morning. (Patient not taking: Reported on 06/29/2024)   Completed Course   VEVYE 0.1 % SOLN Place 1 drop into both eyes daily. (Patient not taking: Reported on 06/29/2024)   Completed Course   Social History   Socioeconomic History   Marital status: Single    Spouse name: Not on file   Number of children: Not on file   Years of education: Not on file   Highest education level: Not on file  Occupational History   Not on file  Tobacco Use   Smoking status: Every Day    Current packs/day: 1.00    Average packs/day: 1 pack/day for 40.0 years (40.0 ttl pk-yrs)    Types: Cigarettes   Smokeless tobacco: Never  Vaping Use   Vaping status: Never Used  Substance and Sexual Activity   Alcohol  use: Not Currently    Comment: social   Drug use: Never   Sexual activity: Never  Other Topics Concern   Not on file  Social History  Narrative   Divorced and lives alone.  Indoor pets, 2 cats.    Social Drivers of Corporate investment banker Strain: Low Risk  (04/11/2024)   Received from [facility]   Overall Financial Resource Strain (CARDIA)    Difficulty of Paying Living Expenses: Not hard at all  Food Insecurity: No Food Insecurity (04/11/2024)   Received from [facility]   Hunger Vital Sign    Within the past 12 months, you worried that your food would run out before you got the money to buy more.: Never true    Within the past 12 months, the food you bought just didn't last and you didn't have money to get more.: Never true  Transportation Needs: No Transportation Needs (04/11/2024)   Received from [facility] - Transportation    In the past 12 months, has lack of transportation kept you from  medical appointments or from getting medications?: No    Lack of Transportation (Non-Medical): No  Physical Activity: Not on file  Stress: Not on file  Social Connections: Socially Isolated (11/29/2023)   Social Connection and Isolation Panel    Frequency of Communication with Friends and Family: More than three times a week    Frequency of Social Gatherings with Friends and Family: Twice a week    Attends Religious Services: Never    Database administrator or Organizations: No    Attends Banker Meetings: Never    Marital Status: Divorced  Catering manager Violence: Not At Risk (11/29/2023)   Humiliation, Afraid, Rape, and Kick questionnaire    Fear of Current or Ex-Partner: No    Emotionally Abused: No    Physically Abused: No    Sexually Abused: No    Family History  Problem Relation Age of Onset   Breast cancer Neg Hx      Vitals:   06/29/24 1346 06/29/24 1347 06/29/24 1348 06/29/24 1355  BP:    113/87  Pulse: (!) 105 (!) 105 (!) 106 (!) 113  Resp: (!) 22 (!) 22 (!) 22 16  Temp:      TempSrc:      SpO2: 99% 99% 99% 99%  Weight:       Height:        PHYSICAL EXAM General: Well appearing female, well nourished, in no acute distress. HEENT: Normocephalic and atraumatic. Neck: No JVD.  Lungs: Normal respiratory effort on room air. Clear bilaterally to auscultation. No wheezes, crackles, rhonchi.  Heart: Irregularly irregular, elevated rate. Normal S1 and S2 without gallops or murmurs.  Abdomen: Non-distended appearing.  Msk: Normal strength and tone for age. Extremities: Warm and well perfused. No clubbing, cyanosis. No edema.  Neuro: Alert and oriented X 3. Psych: Answers questions appropriately.   Labs: Basic Metabolic Panel: No results for input(s): NA, K, CL, CO2, GLUCOSE, BUN, CREATININE, CALCIUM , MG, PHOS in the last 72 hours. Liver Function Tests: No results for input(s): AST, ALT, ALKPHOS, BILITOT, PROT, ALBUMIN in the last 72 hours. No results for input(s): LIPASE, AMYLASE in the last 72 hours. CBC: No results for input(s): WBC, NEUTROABS, HGB, HCT, MCV, PLT in the last 72 hours. Cardiac Enzymes: No results for input(s): CKTOTAL, CKMB, CKMBINDEX, TROPONINIHS in the last 72 hours. BNP: No results for input(s): BNP in the last 72 hours. D-Dimer: No results for input(s): DDIMER in the last 72 hours. Hemoglobin A1C: No results for input(s): HGBA1C in the last 72 hours. Fasting Lipid Panel: No results for input(s): CHOL, HDL, LDLCALC, TRIG, CHOLHDL, LDLDIRECT in the last 72 hours. Thyroid  Function Tests: No results for input(s): TSH, T4TOTAL, T3FREE, THYROIDAB in the last 72 hours.  Invalid input(s): FREET3 Anemia Panel: No results for input(s): VITAMINB12, FOLATE, FERRITIN, TIBC, IRON, RETICCTPCT in the last 72 hours.   Radiology: DG Chest 2 View Result Date: 06/20/2024 CLINICAL DATA:  Chest pain and dizziness. EXAM: CHEST - 2 VIEW COMPARISON:  05/24/2024 FINDINGS: Leads and wires project over the chest on  the frontal radiograph. Midline trachea. Borderline cardiomegaly. No pleural effusion or pneumothorax. No congestive failure. Clear lungs. IMPRESSION: Borderline cardiomegaly, without acute disease. Electronically Signed   By: Rockey Kilts M.D.   On: 06/20/2024 16:10    ECHO - TEE results above  TELEMETRY reviewed by me 06/29/2024: atrial flutter rate 110s  EKG reviewed by me: atrial flutter rate 117 bpm  Data reviewed by me 06/29/2024: last 24h  vitals tele labs imaging I/O ED provider note, admission H&P  Active Problems:   * No active hospital problems. *    ASSESSMENT AND PLAN:  Thaily Hackworth is a 73 y.o. female  with a past medical history of persistent atrial fibrillation s/p Watchman 05/2022, chronic HFmrEF, hypertension, hyperlipidemia, OSA on CPAP, former tobacco use, pulmonary emphysema who is being admitted on 06/29/2024 for AF RVR, LAA thrombus noted on TEE requiring IV anticoagulation.   # Atrial flutter RVR # Persistent atrial fibrillation/flutter s/p Watchman 05/2022 # Left atrial appendage thrombus  # Heart failure reduced ejection fraction Patient presented today for outpatient TEE/cardioversion, found to have 2 cm x 2 cm left atrial appendage thrombus on TEE, cardioversion was canceled. AF.  EF was reduced on TEE at 25-30%, down from 30-35% 02/2024. - No need to repeat surface echo at this time. - Start IV heparin  for anticoagulation given large left atrial appendage thrombus.  Likely will plan to continue this for 2 to 3 days then transition to Eliquis . - Will proceed with rate control strategy as rhythm control is not safe at this time due to elevated risk of stroke given large LAA thrombus.  Home amiodarone  has been discontinued. - Start IV digoxin load 0.5 mg followed by 0.25 mg in 6 hours followed by another 0.25 mg in another 6 hours. Will likely plan to start PO digoxin tomorrow. Home metoprolol  held given borderline BP.  Will resume as able. - Resume home Jardiance 10  mg daily.  Home losartan  held given borderline BP.  Will resume as able.  Further additions to GDMT regimen as able pending BP and renal function. - Hospitalist has been notified to admit patient.  Currently planning for 2A versus stepdown bed pending availability.  This patient's plan of care was discussed and created with Dr. Ammon and he is in agreement.  Signed: Danita Bloch, PA-C  06/29/2024, 2:07 PM [facility]

## 2024-06-29 NOTE — Transfer of Care (Signed)
 Immediate Anesthesia Transfer of Care Note  Patient: Stephanie [name]  Procedure(s) Performed: ECHOCARDIOGRAM, TRANSESOPHAGEAL CARDIOVERSION  Patient Location: PACU  Anesthesia Type:MAC  Level of Consciousness: drowsy  Airway & Oxygen  Therapy: Patient Spontanous Breathing and Patient connected to nasal cannula oxygen   Post-op Assessment: Report given to RN and Post -op Vital signs reviewed and stable  Post vital signs: stable  Last Vitals:  Vitals Value Taken Time  BP    Temp    Pulse    Resp    SpO2      Last Pain:  Vitals:   06/29/24 1231  TempSrc: Oral  PainSc: 0-No pain         Complications: No notable events documented.

## 2024-06-29 NOTE — Anesthesia Preprocedure Evaluation (Signed)
 Anesthesia Evaluation  Patient identified by MRN, date of birth, ID band Patient awake    Reviewed: Allergy & Precautions, NPO status , Patient's Chart, lab work & pertinent test results  History of Anesthesia Complications Negative for: history of anesthetic complications  Airway Mallampati: III   Neck ROM: Full    Dental  (+) Missing   Pulmonary sleep apnea and Continuous Positive Airway Pressure Ventilation , COPD, Current Smoker (1 ppd)Patient did not abstain from smoking.   Pulmonary exam normal breath sounds clear to auscultation       Cardiovascular hypertension, + Past MI (NSTEMI 08/2023) and +CHF  + dysrhythmias (a fib s/p Watchman on Eliquis )  Rhythm:Irregular Rate:Normal  Echo 11/02/23:  1. Left ventricular ejection fraction, by estimation, is 30 to 35%. The left ventricle has moderately decreased function. The left ventricle demonstrates global hypokinesis. Left ventricular diastolic parameters are indeterminate.   2. Right ventricular systolic function is mildly reduced. The right ventricular size is normal. There is normal pulmonary artery systolic pressure. The estimated right ventricular systolic pressure is 31.5 mmHg.   3. Left atrial size was severely dilated.   4. Right atrial size was mildly dilated.   5. The mitral valve is normal in structure. Moderate mitral valve regurgitation. No evidence of mitral stenosis.   6. Tricuspid valve regurgitation is moderate.   7. The aortic valve is normal in structure. Aortic valve regurgitation is not visualized. No aortic stenosis is present.   8. The inferior vena cava is normal in size with greater than 50% respiratory variability, suggesting right atrial pressure of 3 mmHg.    Myocardial perfusion 01/20/22:   Mildly abnormal myocardial perfusion scan evidence of apical defect with questionable redistribution overall ejection fraction 53% with overall normal wall motion  conclusion this is a intermediate risk scan consider invasive evaluation depending on patient's symptoms     Neuro/Psych  PSYCHIATRIC DISORDERS Anxiety Depression    TIA (2024)   GI/Hepatic negative GI ROS,,,  Endo/Other  negative endocrine ROS    Renal/GU negative Renal ROS     Musculoskeletal  (+) Arthritis ,    Abdominal   Peds  Hematology negative hematology ROS (+)   Anesthesia Other Findings   Reproductive/Obstetrics                              Anesthesia Physical Anesthesia Plan  ASA: 3  Anesthesia Plan: General   Post-op Pain Management:    Induction: Intravenous  PONV Risk Score and Plan: 3 and Propofol  infusion, TIVA and Treatment may vary due to age or medical condition  Airway Management Planned: Natural Airway  Additional Equipment:   Intra-op Plan:   Post-operative Plan:   Informed Consent: I have reviewed the patients History and Physical, chart, labs and discussed the procedure including the risks, benefits and alternatives for the proposed anesthesia with the patient or authorized representative who has indicated his/her understanding and acceptance.       Plan Discussed with: CRNA  Anesthesia Plan Comments: (LMA/GETA backup discussed.  Patient consented for risks of anesthesia including but not limited to:  - adverse reactions to medications - damage to eyes, teeth, lips or other oral mucosa - nerve damage due to positioning  - sore throat or hoarseness - damage to heart, brain, nerves, lungs, other parts of body or loss of life  Informed patient about role of CRNA in peri- and intra-operative care.  Patient voiced understanding.)  Anesthesia Quick Evaluation

## 2024-06-29 NOTE — Consult Note (Signed)
 Pharmacy Consult Note - Anticoagulation  Pharmacy Consult for heparin  Indication: large LAA thrombus and atrial fibrillation  PATIENT MEASUREMENTS: Height: 5' 5 (165.1 cm) Weight: 74.4 kg (164 lb) IBW/kg (Calculated) : 57 HEPARIN  DW (KG): 72.2  VITAL SIGNS: Temp: 97.9 F (36.6 C) (10/09 1231) Temp Source: Oral (10/09 1231) BP: 113/87 (10/09 1355) Pulse Rate: 113 (10/09 1355)  No results for input(s): HGB, HCT, PLT, APTT, LABPROT, INR, HEPARINUNFRC, HEPRLOWMOCWT, CREATININE, CKTOTAL, CKMB, TROPONINIHS in the last 72 hours.  Estimated Creatinine Clearance: 42 mL/min (A) (by C-G formula based on SCr of 1.17 mg/dL (H)).  PAST MEDICAL HISTORY: Past Medical History:  Diagnosis Date   Anxiety and depression 05/24/2015   Chronic combined systolic and diastolic heart failure (HCC) 11/27/2023   Diverticulosis    History of non-ST elevation myocardial infarction (NSTEMI) 01/05/2024   Hyperlipidemia    Hypertension    Paroxysmal A-fib (HCC) 05/24/2015    ASSESSMENT: 73 y.o. female with PMH including Afib on Eliquis , NSTEMI is presenting with large left atrial appendage thrombus demonstrated on outpatient TEE. Patient is on chronic anticoagulation with Eliquis  per chart review, last dose this morning. Most recent dispense of Eliquis  noted to be in July 2025 for a 30-day supply. Pharmacy has been consulted to initiate and manage heparin  intravenous infusion.  Pertinent medications: Eliquis  5mg  PO twice daily  Goal(s) of therapy: Heparin  level 0.3 - 0.7 units/mL aPTT 66 - 102 seconds Monitor platelets by anticoagulation protocol: Yes   Baseline anticoagulation labs including CBC, aPTT, anti-Xa, INR have been ordered.  Date Time aPTT/HL Rate/Comment     PLAN: Give 4300 units bolus x1; then start heparin  infusion at 1150 units/hour. Check heparin  level and aPTT in 8 hours, and use baseline anticoag labs to assess which one to use for heparin  titration. If  using aPTT, continue to titrate by aPTT until heparin  level and aPTT correlate, then titrate by heparin  level alone. Monitor CBC daily while on heparin  infusion.  Will M. Lenon, PharmD, BCPS Clinical Pharmacist 06/29/2024 2:29 PM

## 2024-06-29 NOTE — Progress Notes (Signed)
 Cardioversion cancelled due to patient having large thrombus in heart.

## 2024-06-30 ENCOUNTER — Encounter: Payer: Self-pay | Admitting: Cardiology

## 2024-06-30 ENCOUNTER — Inpatient Hospital Stay
Admission: AD | Admit: 2024-06-30 | Discharge: 2024-06-30 | Disposition: A | Discharge status: Home / Self Care | Source: Home / Self Care | Attending: Student | Admitting: Student

## 2024-06-30 DIAGNOSIS — I513 Intracardiac thrombosis, not elsewhere classified: Secondary | ICD-10-CM

## 2024-06-30 LAB — DIGOXIN LEVEL: Digoxin Level: 2.9 ng/mL (ref 0.8–2.0)

## 2024-06-30 LAB — CBC
HCT: 43.8 % (ref 36.0–46.0)
Hemoglobin: 14.1 g/dL (ref 12.0–15.0)
MCH: 31.1 pg (ref 26.0–34.0)
MCHC: 32.2 g/dL (ref 30.0–36.0)
MCV: 96.5 fL (ref 80.0–100.0)
Platelets: 189 K/uL (ref 150–400)
RBC: 4.54 MIL/uL (ref 3.87–5.11)
RDW: 14 % (ref 11.5–15.5)
WBC: 5.3 K/uL (ref 4.0–10.5)
nRBC: 0 % (ref 0.0–0.2)

## 2024-06-30 LAB — ECHOCARDIOGRAM LIMITED
Height: 65 in
Single Plane A4C EF: 33.8 %
Weight: 2624 [oz_av]

## 2024-06-30 LAB — BASIC METABOLIC PANEL WITH GFR
Anion gap: 11 (ref 5–15)
BUN: 22 mg/dL (ref 8–23)
CO2: 24 mmol/L (ref 22–32)
Calcium: 8.8 mg/dL — ABNORMAL LOW (ref 8.9–10.3)
Chloride: 105 mmol/L (ref 98–111)
Creatinine, Ser: 1.47 mg/dL — ABNORMAL HIGH (ref 0.44–1.00)
GFR, Estimated: 37 mL/min — ABNORMAL LOW (ref >60)
Glucose, Bld: 88 mg/dL (ref 70–99)
Potassium: 4.9 mmol/L (ref 3.5–5.1)
Sodium: 140 mmol/L (ref 135–145)

## 2024-06-30 LAB — HEPARIN LEVEL (UNFRACTIONATED): Heparin Unfractionated: >1.1 [IU]/mL — ABNORMAL HIGH (ref 0.30–0.70)

## 2024-06-30 LAB — APTT
aPTT: 81 s — ABNORMAL HIGH (ref 24–36)
aPTT: 75 s — ABNORMAL HIGH (ref 24–36)

## 2024-06-30 MED ORDER — IPRATROPIUM-ALBUTEROL 0.5-2.5 (3) MG/3ML IN SOLN: 3.0000 mL | Freq: Four times a day (QID) | RESPIRATORY_TRACT | Status: DC | PRN

## 2024-06-30 MED ORDER — DIGOXIN 250 MCG PO TABS
0.2500 mg | ORAL_TABLET | Freq: Every day | ORAL | Status: DC
  Filled 2024-06-30: qty 1

## 2024-06-30 MED ORDER — PERFLUTREN LIPID MICROSPHERE
1.0000 mL | INTRAVENOUS | Status: AC | PRN
  Administered 2024-06-30: 2 mL via INTRAVENOUS

## 2024-06-30 MED ORDER — DIGOXIN 250 MCG PO TABS: 0.2500 mg | ORAL_TABLET | Freq: Every day | ORAL | Status: DC

## 2024-06-30 MED ORDER — METOPROLOL SUCCINATE ER 50 MG PO TB24
50.0000 mg | ORAL_TABLET | Freq: Every day | ORAL | Status: DC
  Administered 2024-06-30 – 2024-07-01 (×2): 50 mg via ORAL
  Filled 2024-06-30 (×2): qty 1

## 2024-06-30 MED ORDER — HEPARIN (PORCINE) 25000 UT/250ML-% IV SOLN
1050.0000 [IU]/h | INTRAVENOUS | Status: DC
  Administered 2024-06-30 (×2): 900 [IU]/h via INTRAVENOUS
  Administered 2024-07-01: 1050 [IU]/h via INTRAVENOUS
  Filled 2024-06-30: qty 250

## 2024-06-30 MED ORDER — SPIRONOLACTONE 12.5 MG HALF TABLET
12.5000 mg | ORAL_TABLET | Freq: Every day | ORAL | Status: DC
  Administered 2024-06-30 – 2024-07-01 (×2): 12.5 mg via ORAL
  Filled 2024-06-30 (×2): qty 1

## 2024-06-30 NOTE — Progress Notes (Signed)
 PT Cancellation Note  Patient Details Name: Stephanie [name] MRN: [id number] DOB: 1951/06/07   Cancelled Treatment:    Reason Eval/Treat Not Completed: PT screened, no needs identified, will sign off (Alerted by OT that patient is independent with mobility. No apparent acute PT needs at this time.)  [name], PT, MPT   [name] 06/30/2024, 12:46 PM

## 2024-06-30 NOTE — Progress Notes (Addendum)
 [facility] CLINIC CARDIOLOGY PROGRESS NOTE       Patient ID: Stephanie [name] MRN: [id number] DOB/AGE: [birth year] 73 y.o.  Admit date: 06/29/2024 Referring Physician None Primary Physician Watt Mirza, MD  Primary Cardiologist Tinnie Maiden, NP Reason for Consultation AF RVR, LAA thrombus  HPI: Stephanie [name] is a 73 y.o. female  with a past medical history of persistent atrial fibrillation s/p Watchman 05/2022, chronic HFmrEF, hypertension, hyperlipidemia, OSA on CPAP, former tobacco use, pulmonary emphysema who is being admitted on 06/29/2024 for AF RVR, LAA thrombus noted on TEE requiring IV anticoagulation.   Interval history: - Patient seen and examined this morning, resting comfortably in hospital bed. - Heart rate much better controlled this morning in the 60s on telemetry, atrial flutter.  BP stable this morning. - She states that overall her shortness of breath is improved today and she denies any palpitation symptoms.  Review of systems complete and found to be negative unless listed above    Past Medical History:  Diagnosis Date   Anxiety and depression 05/24/2015   Chronic combined systolic and diastolic heart failure (HCC) 11/27/2023   Diverticulosis    History of non-ST elevation myocardial infarction (NSTEMI) 01/05/2024   Hyperlipidemia    Hypertension    Paroxysmal A-fib (HCC) 05/24/2015    Past Surgical History:  Procedure Laterality Date   CARDIOVERSION N/A 05/12/2018   Procedure: CARDIOVERSION;  Surgeon: Hester Wolm PARAS, MD;  Location: [facility] ORS;  Service: Cardiovascular;  Laterality: N/A;   CARDIOVERSION N/A 06/19/2021   Procedure: CARDIOVERSION;  Surgeon: Florencio Cara BIRCH, MD;  Location: [facility] ORS;  Service: Cardiovascular;  Laterality: N/A;   CARDIOVERSION N/A 12/06/2023   Procedure: CARDIOVERSION;  Surgeon: Florencio Cara BIRCH, MD;  Location: [facility] ORS;  Service: Cardiovascular;  Laterality: N/A;   CHOLECYSTECTOMY  2004   COLONOSCOPY WITH PROPOFOL  N/A  05/24/2019   Procedure: COLONOSCOPY WITH PROPOFOL ;  Surgeon: Toledo, Ladell POUR, MD;  Location: [facility] ENDOSCOPY;  Service: Gastroenterology;  Laterality: N/A;   ESOPHAGOGASTRODUODENOSCOPY (EGD) WITH PROPOFOL  N/A 05/24/2019   Procedure: ESOPHAGOGASTRODUODENOSCOPY (EGD) WITH PROPOFOL ;  Surgeon: Toledo, Ladell POUR, MD;  Location: [facility] ENDOSCOPY;  Service: Gastroenterology;  Laterality: N/A;   KNEE SURGERY     KNEE SURGERY     LEFT HEART CATH AND CORONARY ANGIOGRAPHY N/A 08/23/2023   Procedure: LEFT HEART CATH AND CORONARY ANGIOGRAPHY;  Surgeon: Florencio Cara BIRCH, MD;  Location: [facility] INVASIVE CV LAB;  Service: Cardiovascular;  Laterality: N/A;   TEE WITHOUT CARDIOVERSION N/A 05/12/2018   Procedure: TRANSESOPHAGEAL ECHOCARDIOGRAM (TEE);  Surgeon: Hester Wolm PARAS, MD;  Location: [facility] ORS;  Service: Cardiovascular;  Laterality: N/A;   TOE SURGERY      Medications Prior to Admission  Medication Sig Dispense Refill Last Dose/Taking   acetaminophen  (TYLENOL ) 500 MG tablet Take 500 mg by mouth every 4 (four) hours as needed (pain).   Taking As Needed   albuterol  (VENTOLIN  HFA) 108 (90 Base) MCG/ACT inhaler Inhale 2 puffs into the lungs every 6 (six) hours as needed for wheezing or shortness of breath. 8 g 2 Taking As Needed   apixaban  (ELIQUIS ) 5 MG TABS tablet Take 5 mg by mouth 2 (two) times daily.   06/29/2024   Ascorbic Acid  (VITAMIN C  PO) Take 1 tablet by mouth at bedtime.   06/28/2024   buPROPion  (WELLBUTRIN  XL) 150 MG 24 hr tablet Take 1 tablet (150 mg total) by mouth daily. (Patient taking differently: Take 150 mg by mouth at bedtime.) 30 tablet 3 06/28/2024   calcium  citrate-vitamin D  (  CITRACAL+D) 315-200 MG-UNIT per tablet Take 1 tablet by mouth at bedtime.   06/28/2024   colestipol  (COLESTID ) 1 g tablet Take 1 g by mouth daily as needed (diarrhea).   Taking As Needed   empagliflozin (JARDIANCE) 10 MG TABS tablet Take 10 mg by mouth in the morning.   06/29/2024   fluticasone-salmeterol (ADVAIR) 250-50  MCG/ACT AEPB Inhale 1 puff into the lungs in the morning and at bedtime.   Past Week   ipratropium-albuterol  (DUONEB) 0.5-2.5 (3) MG/3ML SOLN Take 3 mLs by nebulization daily in the afternoon.   Taking   losartan  (COZAAR ) 25 MG tablet Take 25 mg by mouth in the morning.   06/29/2024   metoprolol  succinate (TOPROL -XL) 50 MG 24 hr tablet Take 50 mg by mouth 2 (two) times daily.   06/29/2024   montelukast (SINGULAIR) 10 MG tablet Take 10 mg by mouth at bedtime.   06/28/2024   sertraline  (ZOLOFT ) 50 MG tablet Take 50 mg by mouth at bedtime.   06/28/2024   Spacer/Aero-Holding Chambers (AEROCHAMBER MV) inhaler Use as instructed 1 each 0    spironolactone (ALDACTONE) 25 MG tablet Take 12.5 mg by mouth in the morning. (Patient not taking: Reported on 06/29/2024)   Completed Course   VEVYE 0.1 % SOLN Place 1 drop into both eyes daily. (Patient not taking: Reported on 06/29/2024)   Completed Course   Social History   Socioeconomic History   Marital status: Single    Spouse name: Not on file   Number of children: Not on file   Years of education: Not on file   Highest education level: Not on file  Occupational History   Not on file  Tobacco Use   Smoking status: Every Day    Current packs/day: 1.00    Average packs/day: 1 pack/day for 40.0 years (40.0 ttl pk-yrs)    Types: Cigarettes   Smokeless tobacco: Never  Vaping Use   Vaping status: Never Used  Substance and Sexual Activity   Alcohol  use: Not Currently    Comment: social   Drug use: Never   Sexual activity: Never  Other Topics Concern   Not on file  Social History Narrative   Divorced and lives alone.  Indoor pets, 2 cats.    Social Drivers of Corporate investment banker Strain: Low Risk  (04/11/2024)   Received from [facility]   Overall Financial Resource Strain (CARDIA)    Difficulty of Paying Living Expenses: Not hard at all  Food Insecurity: No Food Insecurity (06/29/2024)   Hunger Vital Sign    Worried About  Running Out of Food in the Last Year: Never true    Ran Out of Food in the Last Year: Never true  Transportation Needs: No Transportation Needs (06/29/2024)   PRAPARE - Administrator, Civil Service (Medical): No    Lack of Transportation (Non-Medical): No  Physical Activity: Not on file  Stress: Not on file  Social Connections: Socially Isolated (06/29/2024)   Social Connection and Isolation Panel    Frequency of Communication with Friends and Family: More than three times a week    Frequency of Social Gatherings with Friends and Family: Twice a week    Attends Religious Services: Never    Database administrator or Organizations: No    Attends Banker Meetings: Never    Marital Status: Divorced  Catering manager Violence: Not At Risk (06/29/2024)   Humiliation, Afraid, Rape, and Kick questionnaire  Fear of Current or Ex-Partner: No    Emotionally Abused: No    Physically Abused: No    Sexually Abused: No    Family History  Problem Relation Age of Onset   Breast cancer Neg Hx      Vitals:   06/29/24 1644 06/29/24 2046 06/29/24 2315 06/30/24 0335  BP: 100/67 102/70 (!) 107/53 (!) 112/54  Pulse: 95 62  61  Resp: 20 18 18 18   Temp: (!) 97.5 F (36.4 C) 97.8 F (36.6 C) (!) 97.4 F (36.3 C) (!) 97.4 F (36.3 C)  TempSrc: Oral     SpO2: 100% 99% 100% 100%  Weight:      Height:        PHYSICAL EXAM General: Well appearing female, well nourished, in no acute distress. HEENT: Normocephalic and atraumatic. Neck: No JVD.  Lungs: Normal respiratory effort on room air. Clear bilaterally to auscultation. No wheezes, crackles, rhonchi.  Heart: Irregularly irregular, controlled rate. Normal S1 and S2 without gallops or murmurs.  Abdomen: Non-distended appearing.  Msk: Normal strength and tone for age. Extremities: Warm and well perfused. No clubbing, cyanosis. No edema.  Neuro: Alert and oriented X 3. Psych: Answers questions appropriately.    Labs: Basic Metabolic Panel: Recent Labs    06/29/24 1739 06/30/24 0426  NA 137 140  K 4.1 4.9  CL 102 105  CO2 27 24  GLUCOSE 108* 88  BUN 19 22  CREATININE 1.40* 1.47*  CALCIUM  8.6* 8.8*  MG 2.4  --    Liver Function Tests: No results for input(s): AST, ALT, ALKPHOS, BILITOT, PROT, ALBUMIN in the last 72 hours. No results for input(s): LIPASE, AMYLASE in the last 72 hours. CBC: Recent Labs    06/29/24 1432 06/30/24 0426  WBC 5.4 5.3  HGB 15.0 14.1  HCT 44.3 43.8  MCV 93.7 96.5  PLT 224 189   Cardiac Enzymes: No results for input(s): CKTOTAL, CKMB, CKMBINDEX, TROPONINIHS in the last 72 hours. BNP: No results for input(s): BNP in the last 72 hours. D-Dimer: No results for input(s): DDIMER in the last 72 hours. Hemoglobin A1C: No results for input(s): HGBA1C in the last 72 hours. Fasting Lipid Panel: No results for input(s): CHOL, HDL, LDLCALC, TRIG, CHOLHDL, LDLDIRECT in the last 72 hours. Thyroid  Function Tests: No results for input(s): TSH, T4TOTAL, T3FREE, THYROIDAB in the last 72 hours.  Invalid input(s): FREET3 Anemia Panel: No results for input(s): VITAMINB12, FOLATE, FERRITIN, TIBC, IRON, RETICCTPCT in the last 72 hours.   Radiology: DG Chest 2 View Result Date: 06/20/2024 CLINICAL DATA:  Chest pain and dizziness. EXAM: CHEST - 2 VIEW COMPARISON:  05/24/2024 FINDINGS: Leads and wires project over the chest on the frontal radiograph. Midline trachea. Borderline cardiomegaly. No pleural effusion or pneumothorax. No congestive failure. Clear lungs. IMPRESSION: Borderline cardiomegaly, without acute disease. Electronically Signed   By: Rockey Kilts M.D.   On: 06/20/2024 16:10    ECHO - TEE results above  TELEMETRY reviewed by me 06/30/2024: atrial flutter rate 60s  EKG reviewed by me: atrial flutter rate 117 bpm  Data reviewed by me 06/30/2024: last 24h vitals tele labs imaging I/O  admission H&P  Principal Problem:   Left atrial thrombus Active Problems:   Essential hypertension   Tobacco use disorder   Paroxysmal A-fib (HCC)   Atrial fibrillation with RVR (HCC)   COPD (chronic obstructive pulmonary disease) (HCC)   OSA (obstructive sleep apnea)    ASSESSMENT AND PLAN:  Stephanie [name] is a 73  y.o. female  with a past medical history of persistent atrial fibrillation s/p Watchman 05/2022, chronic HFmrEF, hypertension, hyperlipidemia, OSA on CPAP, former tobacco use, pulmonary emphysema who is being admitted on 06/29/2024 for AF RVR, LAA thrombus noted on TEE requiring IV anticoagulation.   # Atrial flutter RVR # Persistent atrial fibrillation/flutter s/p Watchman 05/2022 # Left atrial appendage thrombus  # Heart failure reduced ejection fraction Patient presented today for outpatient TEE/cardioversion, found to have 2 cm x 2 cm left atrial appendage thrombus on TEE, cardioversion was canceled. AF.  EF was reduced on TEE at 25-30%, down from 30-35% 02/2024. - Will obtain limited echo for baseline size of LAA thrombus so that this can be compared on future echoes. - Continue IV heparin  for anticoagulation given large left atrial appendage thrombus.  Continue heparin  until day of discharge and then transition to Eliquis  5 mg twice daily. - Will proceed with rate control strategy as rhythm control is not safe at this time due to elevated risk of stroke given large LAA thrombus.  Home amiodarone  has been discontinued. - Start p.o. digoxin tomorrow.  Received a total of 1 mg IV digoxin over the last 24 hours. - Resume home metoprolol  succinate 50 mg daily today. - Start spironolactone 12.5 mg daily. Continue home Jardiance 10 mg daily.  Home losartan  held given borderline BP.  Will resume as able.  Further additions to GDMT regimen as able pending BP and renal function.  This patient's plan of care was discussed and created with Dr. Ammon and he is in  agreement.  Signed: Danita Bloch, PA-C  06/30/2024, 8:12 AM [facility]

## 2024-06-30 NOTE — Consult Note (Addendum)
 PHARMACY - ANTICOAGULATION CONSULT NOTE  Pharmacy Consult for heparin  Indication: large LAA thrombus and atrial fibrillation  Allergies  Allergen Reactions   Codeine Other (See Comments)    Cannot tolerate alone, only when mixed with another medication. Makes patient feel spaced out. Patient has use codeine combined with other medications.   Lactulose Diarrhea   Lactose Intolerance (Gi)    Phenazopyridine Hcl Hives    Patient Measurements: Height: 5' 5 (165.1 cm) Weight: 74.4 kg (164 lb) IBW/kg (Calculated) : 57 HEPARIN  DW (KG): 72.2  Vital Signs: Temp: 97.7 F (36.5 C) (10/10 0831) Temp Source: Oral (10/10 0831) BP: 113/70 (10/10 0946) Pulse Rate: 76 (10/10 0946)  Labs: Recent Labs    06/29/24 1432 06/29/24 1507 06/29/24 1739 06/29/24 2310 06/30/24 0426 06/30/24 1005  HGB 15.0  --   --   --  14.1  --   HCT 44.3  --   --   --  43.8  --   PLT 224  --   --   --  189  --   APTT 35  --   --  158*  --  81*  LABPROT 18.5*  --   --   --   --   --   INR 1.5*  --   --   --   --   --   HEPARINUNFRC >1.10*  --   --  >1.10*  --  >1.10*  CREATININE  --  1.39* 1.40*  --  1.47*  --     Estimated Creatinine Clearance: 33.4 mL/min (A) (by C-G formula based on SCr of 1.47 mg/dL (H)).   Medical History: Past Medical History:  Diagnosis Date   Anxiety and depression 05/24/2015   Chronic combined systolic and diastolic heart failure (HCC) 11/27/2023   Diverticulosis    History of non-ST elevation myocardial infarction (NSTEMI) 01/05/2024   Hyperlipidemia    Hypertension    Paroxysmal A-fib (HCC) 05/24/2015     Assessment: 73 y.o. female with PMH including Afib on Eliquis , NSTEMI is presenting with large left atrial appendage thrombus demonstrated on outpatient TEE. Patient is on chronic anticoagulation with Eliquis  per chart review, last dose yesterday morning. Most recent dispense of Eliquis  noted to be in July 2025 for a 30-day supply. Patient's CHADVASC score is 6 which  corresponds to a high annual stroke risk. Their CBC and Hgb are stable. Pharmacy has been consulted to initiate and manage heparin  intravenous infusion.   Goal of Therapy  Heparin  level 0.3 - 0.7 units/mL aPTT 66 - 102 seconds Monitor platelets by anticoagulation protocol: Yes  Date    Time    aPTT/HL         Rate/Comment 10/10     1005    81, > 1.10       900 units/hr     Plan:  - Continue Heparin  infusion at a rate of 900 units/hr. - Recheck aPTT every 8 hours. - Continue daily CBC & HL monitoring while on heparin  infusion.  Fredia Main, PharmD Candidate 06/30/2024,10:52 AM

## 2024-06-30 NOTE — Progress Notes (Signed)
 Date and time results received: 06/30/24 1215 (use smartphrase .now to insert current time)  Test: digoxin Critical Value: 2.9  Name of Provider Notified:  Caralyn Hudson,PA-C  Orders Received? Or Actions Taken?: Digoxin was held for today

## 2024-06-30 NOTE — Consult Note (Signed)
 Pharmacy Consult Note - Anticoagulation  Pharmacy Consult for heparin  Indication: large LAA thrombus and atrial fibrillation  PATIENT MEASUREMENTS: Height: 5' 5 (165.1 cm) Weight: 74.4 kg (164 lb) IBW/kg (Calculated) : 57 HEPARIN  DW (KG): 72.2  VITAL SIGNS: Temp: 97.4 F (36.3 C) (10/09 2315) Temp Source: Oral (10/09 1644) BP: 107/53 (10/09 2315) Pulse Rate: 62 (10/09 2046)  Recent Labs    06/29/24 1432 06/29/24 1507 06/29/24 1739 06/29/24 2310  HGB 15.0  --   --   --   HCT 44.3  --   --   --   PLT 224  --   --   --   APTT 35  --   --  158*  LABPROT 18.5*  --   --   --   INR 1.5*  --   --   --   HEPARINUNFRC >1.10*  --   --  >1.10*  CREATININE  --    < > 1.40*  --    < > = values in this interval not displayed.    Estimated Creatinine Clearance: 35.1 mL/min (A) (by C-G formula based on SCr of 1.4 mg/dL (H)).  PAST MEDICAL HISTORY: Past Medical History:  Diagnosis Date   Anxiety and depression 05/24/2015   Chronic combined systolic and diastolic heart failure (HCC) 11/27/2023   Diverticulosis    History of non-ST elevation myocardial infarction (NSTEMI) 01/05/2024   Hyperlipidemia    Hypertension    Paroxysmal A-fib (HCC) 05/24/2015    ASSESSMENT: 73 y.o. female with PMH including Afib on Eliquis , NSTEMI is presenting with large left atrial appendage thrombus demonstrated on outpatient TEE. Patient is on chronic anticoagulation with Eliquis  per chart review, last dose this morning. Most recent dispense of Eliquis  noted to be in July 2025 for a 30-day supply. Pharmacy has been consulted to initiate and manage heparin  intravenous infusion.  Pertinent medications: Eliquis  5mg  PO twice daily  Goal(s) of therapy: Heparin  level 0.3 - 0.7 units/mL aPTT 66 - 102 seconds Monitor platelets by anticoagulation protocol: Yes   Baseline anticoagulation labs including CBC, aPTT, anti-Xa, INR have been ordered.  Date Time aPTT/HL Rate/Comment 10/9     2310    158, >  1.10       1150 units/hr,  elevated    PLAN: 10/9 @ 2310:  aPTT = 158,   HL = > 1.10 - aPTT elevated,   HL elevated from Eliquis  PTA - will hold heparin  infusion for 1 hr and then restart at 900 units/hr - recheck aPTT and HL 8 hrs after restart  If using aPTT, continue to titrate by aPTT until heparin  level and aPTT correlate, then titrate by heparin  level alone. Monitor CBC daily while on heparin  infusion.  Vinson Tietze D Clinical Pharmacist 06/30/2024 12:24 AM

## 2024-06-30 NOTE — Consult Note (Signed)
 PHARMACY - ANTICOAGULATION CONSULT NOTE  Pharmacy Consult for heparin  Indication: large LAA thrombus and atrial fibrillation  Allergies  Allergen Reactions   Codeine Other (See Comments)    Cannot tolerate alone, only when mixed with another medication. Makes patient feel spaced out. Patient has use codeine combined with other medications.   Lactulose Diarrhea   Lactose Intolerance (Gi)    Phenazopyridine Hcl Hives    Patient Measurements: Height: 5' 5 (165.1 cm) Weight: 74.4 kg (164 lb) IBW/kg (Calculated) : 57 HEPARIN  DW (KG): 72.2  Vital Signs: Temp: 97.6 F (36.4 C) (10/10 1537) Temp Source: Oral (10/10 1537) BP: 125/64 (10/10 1537) Pulse Rate: 72 (10/10 1537)  Labs: Recent Labs    06/29/24 1432 06/29/24 1507 06/29/24 1739 06/29/24 2310 06/30/24 0426 06/30/24 1005 06/30/24 1745  HGB 15.0  --   --   --  14.1  --   --   HCT 44.3  --   --   --  43.8  --   --   PLT 224  --   --   --  189  --   --   APTT 35  --   --  158*  --  81* 75*  LABPROT 18.5*  --   --   --   --   --   --   INR 1.5*  --   --   --   --   --   --   HEPARINUNFRC >1.10*  --   --  >1.10*  --  >1.10*  --   CREATININE  --  1.39* 1.40*  --  1.47*  --   --     Estimated Creatinine Clearance: 33.4 mL/min (A) (by C-G formula based on SCr of 1.47 mg/dL (H)).   Medical History: Past Medical History:  Diagnosis Date   Anxiety and depression 05/24/2015   Chronic combined systolic and diastolic heart failure (HCC) 11/27/2023   Diverticulosis    History of non-ST elevation myocardial infarction (NSTEMI) 01/05/2024   Hyperlipidemia    Hypertension    Paroxysmal A-fib (HCC) 05/24/2015     Assessment: 73 y.o. female with PMH including Afib on Eliquis , NSTEMI is presenting with large left atrial appendage thrombus demonstrated on outpatient TEE. Patient is on chronic anticoagulation with Eliquis  per chart review, last dose yesterday morning. Most recent dispense of Eliquis  noted to be in July 2025  for a 30-day supply. Patient's CHADVASC score is 6 which corresponds to a high annual stroke risk. Their CBC and Hgb are stable. Pharmacy has been consulted to initiate and manage heparin  intravenous infusion.   Last dose noted on med rec was 06/29/24   Goal of Therapy  Heparin  level 0.3 - 0.7 units/mL aPTT 66 - 102 seconds Monitor platelets by anticoagulation protocol: Yes  Date    Time    aPTT/HL         Rate/Comment 10/10     1005    81, > 1.10       900 units/hr   10/10 1745 aPTT 75 Therapeutic x2   Plan:  10/10 1745 aPTT 75   Therapeutic x2 - Continue Heparin  infusion at a rate of 900 units/hr. - Recheck aPTT/HL with am labs.  Plan to transition to HL monitoring when aPTT and HL correlate - Continue daily CBC & HL monitoring while on heparin  infusion.  Allean Haas PharmD Clinical Pharmacist 06/30/2024

## 2024-06-30 NOTE — Care Management Important Message (Signed)
 Important Message  Patient Details  Name: Stephanie [name] MRN: [id number] Date of Birth: 04-20-1951   Important Message Given:  Yes - Medicare IM     Rojelio SHAUNNA Rattler 06/30/2024, 4:06 PM

## 2024-06-30 NOTE — Progress Notes (Signed)
 Progress Note   Patient: Stephanie [name] FMW:[id number] DOB: 02-Feb-1951 DOA: 06/29/2024     1 DOS: the patient was seen and examined on 06/30/2024   Brief hospital course:  From HPI Stephanie [name] is a 73 y.o. year old female with past medical history of HTN, HLD, DDD, COPD presenting to the hospital after going to get an ED to get an ablation. She was noted to have LA thrombus. S/p April 2025 ablation and watchman procedure.    Pt states she was not having any complaints prior to coming in.  She was coming for a TEE as she was not A-fib and consideration was for ablation.  On review of system patient denies any complaints such as chest pain, shortness of breath, palpitations but states that with activity she can get lightheadedness.   TRH contacted for admission.    Assessment and Plan:   Left atrial thrombus: Noted during TEE.   Continue IV heparin   Cardiologist on board we appreciate input  Continue telemetry monitoring   Afib with RVR:  Continue digoxin as recommended by cardiologist as well as heparin  drip abdominal      Chronic Problems: HTN: holding ARB, spironolcatone given hypotensive blood pressures. [name] resume beta blocker.  COPD: On advair OP. Started breo ellipta here. Duonebs prn MDD: continue home zoloft  Tobacco use disorder: Start NRT OSA: restart nightly CPAP.  CKD: renally dose meds, avoid nephrotoxic agents and monitor serial creatinine.    VTE prophylaxis:  IV heparin  gtts  Diet: Access: Lines: Code Status:  Full Code     Family Communication: Updated at bedside    Consults called: Cardiology   Subjective:  Patient seen and examined at bedside this morning Admits to improvement in chest pain as well as shortness of breath Patient followed up by cardiologist with plans to transition to Eliquis  hopefully by tomorrow before discharge  Physical Exam: General: NAD HENT: NCAT Lungs: CTAB, no wheeze, rhonchi or rales.  Cardiovascular: IRIR, no  r/m/g, 2+ pulses in all extremities. No LE edema Abdomen: No TTP, normal bowel sounds MSK: No asymmetry or muscle atrophy.  Skin: no lesions noted on exposed skin Neuro: Alert and oriented x4. CN grossly intact Psych: Normal mood and normal affect   Vitals:   06/30/24 0831 06/30/24 0946 06/30/24 1124 06/30/24 1537  BP: (!) 98/58 113/70 103/70 125/64  Pulse: 63 76 62 72  Resp:    18  Temp: 97.7 F (36.5 C)  97.9 F (36.6 C) 97.6 F (36.4 C)  TempSrc: Oral  Oral Oral  SpO2: 95%  97% 100%  Weight:      Height:        Data Reviewed:     Latest Ref Rng & Units 06/30/2024    4:26 AM 06/29/2024    5:39 PM 06/29/2024    3:07 PM  BMP  Glucose 70 - 99 mg/dL 88  891  78   BUN 8 - 23 mg/dL 22  19  18    Creatinine 0.44 - 1.00 mg/dL 8.52  8.59  8.60   Sodium 135 - 145 mmol/L 140  137  137   Potassium 3.5 - 5.1 mmol/L 4.9  4.1  4.6   Chloride 98 - 111 mmol/L 105  102  104   CO2 22 - 32 mmol/L 24  27  24    Calcium  8.9 - 10.3 mg/dL 8.8  8.6  8.7        Latest Ref Rng & Units 06/30/2024    4:26 AM  06/29/2024    2:32 PM 06/20/2024    3:49 PM  CBC  WBC 4.0 - 10.5 K/uL 5.3  5.4  6.2   Hemoglobin 12.0 - 15.0 g/dL 85.8  84.9  85.7   Hematocrit 36.0 - 46.0 % 43.8  44.3  41.4   Platelets 150 - 400 K/uL 189  224  172     Family Communication: None present at bedside  Disposition: Status is: Inpatient   Time spent: 52 minutes  Author: Drue ONEIDA Potter, MD 06/30/2024 4:44 PM  For on call review [email or web address].

## 2024-06-30 NOTE — Evaluation (Signed)
 Occupational Therapy Evaluation Patient Details Name: Stephanie [name] MRN: [id number] DOB: 15-Nov-1950 Today's Date: 06/30/2024   History of Present Illness   Pt is a 73 y.o. year old female presenting to the hospital after going to get a TEE with consideration for an ablation and was noted to have LA thrombus. Current MD assessment: Left atrial thrombus and Afib with RVR. PMH of S/p April 2025 ablation and watchman procedure, HTN, HLD, DDD, COPD     Clinical Impressions Pt was seen for OT evaluation this date.PTA, she lives alone in a senior living apartment with level entry. At baseline, she is IND with all tasks and walks household distances and limited community distances without a device at baseline. Her family brings her groceries or she has them delivered. She only drives locally and not often. She is ambulating to the bathroom on her own and demo bed mobility and transfer to recliner with no AD and independence. She reports her lightheadedness has already improved since being admitted. Edu on safety and fall prevention strategies as well as use of AE/AD for ADL/IADL performance to decrease bending motion to better manage back pain and prevent falls from dizziness. Pt with no further questions or need for acute therapy. Will sign off in house.      If plan is discharge home, recommend the following:         Functional Status Assessment   Patient has not had a recent decline in their functional status     Equipment Recommendations         Recommendations for Other Services         Precautions/Restrictions   Precautions Precautions: None Restrictions Weight Bearing Restrictions Per Provider Order: No     Mobility Bed Mobility Overal bed mobility: Independent                  Transfers Overall transfer level: Independent                        Balance Overall balance assessment: Independent, Modified Independent                                          ADL either performed or assessed with clinical judgement   ADL Overall ADL's : At baseline;Independent                                       General ADL Comments: edu on use of reacher, sock aide and long handled sponge to decrease back pain and limited bending motion.     Vision         Perception         Praxis         Pertinent Vitals/Pain Pain Assessment Pain Assessment: No/denies pain     Extremity/Trunk Assessment Upper Extremity Assessment Upper Extremity Assessment: Overall WFL for tasks assessed   Lower Extremity Assessment Lower Extremity Assessment: Overall WFL for tasks assessed   Cervical / Trunk Assessment Cervical / Trunk Assessment: Normal   Communication Communication Communication: No apparent difficulties   Cognition Arousal: Alert Behavior During Therapy: WFL for tasks assessed/performed Cognition: No apparent impairments  Following commands: Intact       Cueing  General Comments      stable HR with activity during session   Exercises Other Exercises Other Exercises: Edu on easing back into activity and use of AE/AD to prevent bending and manage back pain and dizziness.   Shoulder Instructions      Home Living Family/patient expects to be discharged to:: Private residence Living Arrangements: Alone Available Help at Discharge: Family Type of Home: Apartment (senior apartments) Home Access: Level entry     Home Layout: One level     Bathroom Shower/Tub: Chief Strategy Officer: Handicapped height Bathroom Accessibility: Yes   Home Equipment: Shower seat;Grab bars - tub/shower;Hand held shower head          Prior Functioning/Environment Prior Level of Function : Independent/Modified Independent;Driving             Mobility Comments: ambulates household distances IND, limited community distances, has groceries  delivered or  family bring them ADLs Comments: IND with ADL/simple IADLs with family assist for higher level tasks    OT Problem List:     OT Treatment/Interventions:        OT Goals(Current goals can be found in the care plan section)       OT Frequency:       Co-evaluation              AM-PAC OT 6 Clicks Daily Activity     Outcome Measure Help from another person eating meals?: None Help from another person taking care of personal grooming?: None Help from another person toileting, which includes using toliet, bedpan, or urinal?: None Help from another person bathing (including washing, rinsing, drying)?: None Help from another person to put on and taking off regular upper body clothing?: None Help from another person to put on and taking off regular lower body clothing?: None 6 Click Score: 24   End of Session    Activity Tolerance: Patient tolerated treatment well Patient left: in chair;with call bell/phone within reach  OT Visit Diagnosis: Other abnormalities of gait and mobility (R26.89)                Time: 9141-9079 OT Time Calculation (min): 22 min Charges:  OT General Charges $OT Visit: 1 Visit OT Evaluation $OT Eval Low Complexity: 1 Low  Fatema Rabe, OTR/L  06/30/24, 11:45 AM   Sloka Volante E Montray Kliebert 06/30/2024, 11:42 AM

## 2024-06-30 NOTE — Plan of Care (Signed)

## 2024-07-01 ENCOUNTER — Other Ambulatory Visit: Payer: Self-pay

## 2024-07-01 DIAGNOSIS — I513 Intracardiac thrombosis, not elsewhere classified: Secondary | ICD-10-CM | POA: Diagnosis not present

## 2024-07-01 LAB — CBC
HCT: 43.6 % (ref 36.0–46.0)
Hemoglobin: 14.2 g/dL (ref 12.0–15.0)
MCH: 31.3 pg (ref 26.0–34.0)
MCHC: 32.6 g/dL (ref 30.0–36.0)
MCV: 96.2 fL (ref 80.0–100.0)
Platelets: 178 K/uL (ref 150–400)
RBC: 4.53 MIL/uL (ref 3.87–5.11)
RDW: 13.6 % (ref 11.5–15.5)
WBC: 4.4 K/uL (ref 4.0–10.5)
nRBC: 0 % (ref 0.0–0.2)

## 2024-07-01 LAB — BASIC METABOLIC PANEL WITH GFR
Anion gap: 8 (ref 5–15)
BUN: 19 mg/dL (ref 8–23)
CO2: 24 mmol/L (ref 22–32)
Calcium: 8.6 mg/dL — ABNORMAL LOW (ref 8.9–10.3)
Chloride: 105 mmol/L (ref 98–111)
Creatinine, Ser: 1.21 mg/dL — ABNORMAL HIGH (ref 0.44–1.00)
GFR, Estimated: 47 mL/min — ABNORMAL LOW (ref >60)
Glucose, Bld: 83 mg/dL (ref 70–99)
Potassium: 4.2 mmol/L (ref 3.5–5.1)
Sodium: 137 mmol/L (ref 135–145)

## 2024-07-01 LAB — HEPARIN LEVEL (UNFRACTIONATED): Heparin Unfractionated: 0.74 [IU]/mL — ABNORMAL HIGH (ref 0.30–0.70)

## 2024-07-01 LAB — ECHO TEE

## 2024-07-01 LAB — APTT
aPTT: 65 s — ABNORMAL HIGH (ref 24–36)
aPTT: 101 s — ABNORMAL HIGH (ref 24–36)

## 2024-07-01 LAB — DIGOXIN LEVEL: Digoxin Level: 1.6 ng/mL (ref 0.8–2.0)

## 2024-07-01 MED ORDER — APIXABAN 5 MG PO TABS
5.0000 mg | ORAL_TABLET | Freq: Two times a day (BID) | ORAL | 5 refills | Status: AC
  Filled 2024-07-01: qty 60, 30d supply, fill #0

## 2024-07-01 MED ORDER — APIXABAN 5 MG PO TABS
10.0000 mg | ORAL_TABLET | Freq: Two times a day (BID) | ORAL | Status: DC
  Administered 2024-07-01: 10 mg via ORAL
  Filled 2024-07-01: qty 2

## 2024-07-01 MED ORDER — HEPARIN BOLUS VIA INFUSION
1100.0000 [IU] | Freq: Once | INTRAVENOUS | Status: AC
  Administered 2024-07-01: 1100 [IU] via INTRAVENOUS
  Filled 2024-07-01: qty 1100

## 2024-07-01 MED ORDER — APIXABAN 5 MG PO TABS
ORAL_TABLET | ORAL | 5 refills | Status: DC
  Filled 2024-07-01: qty 88, 37d supply, fill #0

## 2024-07-01 NOTE — Plan of Care (Signed)
  Problem: Education: Goal: Knowledge of General Education information will improve Description: Including pain rating scale, medication(s)/side effects and non-pharmacologic comfort measures Outcome: Progressing   Problem: Health Behavior/Discharge Planning: Goal: Ability to manage health-related needs will improve Outcome: Progressing   Problem: Clinical Measurements: Goal: Ability to maintain clinical measurements within normal limits will improve Outcome: Progressing Goal: Diagnostic test results will improve Outcome: Progressing Goal: Cardiovascular complication will be avoided Outcome: Progressing   Problem: Coping: Goal: Level of anxiety will decrease Outcome: Progressing   Problem: Safety: Goal: Ability to remain free from injury will improve Outcome: Progressing

## 2024-07-01 NOTE — Consult Note (Signed)
 PHARMACY - ANTICOAGULATION CONSULT NOTE  Pharmacy Consult for heparin  Indication: large LAA thrombus and atrial fibrillation  Allergies  Allergen Reactions   Codeine Other (See Comments)    Cannot tolerate alone, only when mixed with another medication. Makes patient feel spaced out. Patient has use codeine combined with other medications.   Lactulose Diarrhea   Lactose Intolerance (Gi)    Phenazopyridine Hcl Hives    Patient Measurements: Height: 5' 5 (165.1 cm) Weight: 74.4 kg (164 lb) IBW/kg (Calculated) : 57 HEPARIN  DW (KG): 72.2  Vital Signs: Temp: 97.7 F (36.5 C) (10/11 0419) BP: 108/44 (10/11 0419) Pulse Rate: 82 (10/11 0419)  Labs: Recent Labs    06/29/24 1432 06/29/24 1507 06/29/24 1739 06/29/24 2310 06/30/24 0426 06/30/24 1005 06/30/24 1745 07/01/24 0313  HGB 15.0  --   --   --  14.1  --   --  14.2  HCT 44.3  --   --   --  43.8  --   --  43.6  PLT 224  --   --   --  189  --   --  178  APTT 35  --   --  158*  --  81* 75* 65*  LABPROT 18.5*  --   --   --   --   --   --   --   INR 1.5*  --   --   --   --   --   --   --   HEPARINUNFRC >1.10*  --   --  >1.10*  --  >1.10*  --  0.74*  CREATININE  --    < > 1.40*  --  1.47*  --   --  1.21*   < > = values in this interval not displayed.    Estimated Creatinine Clearance: 40.6 mL/min (A) (by C-G formula based on SCr of 1.21 mg/dL (H)).   Medical History: Past Medical History:  Diagnosis Date   Anxiety and depression 05/24/2015   Chronic combined systolic and diastolic heart failure (HCC) 11/27/2023   Diverticulosis    History of non-ST elevation myocardial infarction (NSTEMI) 01/05/2024   Hyperlipidemia    Hypertension    Paroxysmal A-fib (HCC) 05/24/2015     Assessment: 73 y.o. female with PMH including Afib on Eliquis , NSTEMI is presenting with large left atrial appendage thrombus demonstrated on outpatient TEE. Patient is on chronic anticoagulation with Eliquis  per chart review, last dose  yesterday morning. Most recent dispense of Eliquis  noted to be in July 2025 for a 30-day supply. Patient's CHADVASC score is 6 which corresponds to a high annual stroke risk. Their CBC and Hgb are stable. Pharmacy has been consulted to initiate and manage heparin  intravenous infusion.   Last dose noted on med rec was 06/29/24   Goal of Therapy  Heparin  level 0.3 - 0.7 units/mL aPTT 66 - 102 seconds Monitor platelets by anticoagulation protocol: Yes  Date    Time    aPTT/HL         Rate/Comment 10/10     1005    81, > 1.10       900 units/hr   10/10 1745 aPTT 75 Therapeutic x2 10/11   0313      65,  0.74       900 units/hr   Plan:  10/11 @ 0313:  aPTT = 65,  HL =0.74 - will order heparin  1100 units IV X 1 bolus and increase drip rate to 1050 units/hr -  recheck aPTT 8 hrs after rate change - recheck HL on 10/12 with AM labs.  - Plan to transition to HL monitoring when aPTT and HL correlate - Continue daily CBC & HL monitoring while on heparin  infusion.  Danelia Snodgrass D Clinical Pharmacist 07/01/2024

## 2024-07-01 NOTE — Progress Notes (Signed)
 [facility] Cardiology  SUBJECTIVE: Patient laying in bed, eating breakfast, denies chest pain or shortness of breath   Vitals:   06/30/24 1945 07/01/24 0108 07/01/24 0419 07/01/24 0908  BP: 92/64 (!) 135/59 (!) 108/44 105/81  Pulse: 77 (!) 52 82 71  Resp: 18 20 18    Temp: 97.8 F (36.6 C) (!) 97.5 F (36.4 C) 97.7 F (36.5 C) 98.7 F (37.1 C)  TempSrc:      SpO2: 99% 97% 93% 99%  Weight:      Height:         Intake/Output Summary (Last 24 hours) at 07/01/2024 0930 Last data filed at 07/01/2024 9352 Gross per 24 hour  Intake 818.92 ml  Output --  Net 818.92 ml      PHYSICAL EXAM  General: Well developed, well nourished, in no acute distress HEENT:  Normocephalic and atramatic Neck:  No JVD.  Lungs: Clear bilaterally to auscultation and percussion. Heart: HRRR . Normal S1 and S2 without gallops or murmurs.  Abdomen: Bowel sounds are positive, abdomen soft and non-tender  Msk:  Back normal, normal gait. Normal strength and tone for age. Extremities: No clubbing, cyanosis or edema.   Neuro: Alert and oriented X 3. Psych:  Good affect, responds appropriately   LABS: Basic Metabolic Panel: Recent Labs    06/29/24 1739 06/30/24 0426 07/01/24 0313  NA 137 140 137  K 4.1 4.9 4.2  CL 102 105 105  CO2 27 24 24   GLUCOSE 108* 88 83  BUN 19 22 19   CREATININE 1.40* 1.47* 1.21*  CALCIUM  8.6* 8.8* 8.6*  MG 2.4  --   --    Liver Function Tests: No results for input(s): AST, ALT, ALKPHOS, BILITOT, PROT, ALBUMIN in the last 72 hours. No results for input(s): LIPASE, AMYLASE in the last 72 hours. CBC: Recent Labs    06/30/24 0426 07/01/24 0313  WBC 5.3 4.4  HGB 14.1 14.2  HCT 43.8 43.6  MCV 96.5 96.2  PLT 189 178   Cardiac Enzymes: No results for input(s): CKTOTAL, CKMB, CKMBINDEX, TROPONINI in the last 72 hours. BNP: Invalid input(s): POCBNP D-Dimer: No results for input(s): DDIMER in the last 72 hours. Hemoglobin A1C: No results for  input(s): HGBA1C in the last 72 hours. Fasting Lipid Panel: No results for input(s): CHOL, HDL, LDLCALC, TRIG, CHOLHDL, LDLDIRECT in the last 72 hours. Thyroid  Function Tests: No results for input(s): TSH, T4TOTAL, T3FREE, THYROIDAB in the last 72 hours.  Invalid input(s): FREET3 Anemia Panel: No results for input(s): VITAMINB12, FOLATE, FERRITIN, TIBC, IRON, RETICCTPCT in the last 72 hours.  ECHO TEE Result Date: 07/01/2024    TRANSESOPHOGEAL ECHO REPORT   Patient Name:   Stephanie [name] Date of Exam: 06/29/2024 Medical Rec #:  [id number]      Height:       65.0 in Accession #:    [id number]     Weight:       164.0 lb Date of Birth:  1950/11/11      BSA:          1.818 m Patient Age:    73 years       BP:           103/70 mmHg Patient Gender: F              HR:           62 bpm. Exam Location:  [facility] Procedure: Transesophageal Echo, Cardiac Doppler and Color Doppler (Both  Spectral and Color Flow Doppler were utilized during procedure). Indications:     Not listed on TEE check-in sheet  History:         Patient has prior history of Echocardiogram examinations, most                  recent 11/30/2023. Risk Factors:Hypertension.  Sonographer:     Christopher Furnace Referring Phys:  [id number] CARALYN HUDSON Diagnosing Phys: Keller Paterson PROCEDURE: After discussion of the risks and benefits of a TEE, an informed consent was obtained from the patient. The transesophogeal probe was passed without difficulty through the esophogus of the patient. Sedation performed by different physician. The patient was monitored while under deep sedation. Image quality was excellent. The patient's vital signs; including heart rate, blood pressure, and oxygen  saturation; remained stable throughout the procedure. The patient developed no complications during the procedure.  IMPRESSIONS  1. Left ventricular ejection fraction, by estimation, is 25 to 30%. The left ventricle has severely decreased  function.  2. Right ventricular systolic function is moderately reduced. The right ventricular size is normal.  3. Large echogenic structure (2 cm X 1.7 cm) noted near the roof of left atrium suggestive of thrombus vs mass. Watchman device well seated with no peridevice leak or thrombus on the device. Left atrial size was dilated.  4. Right atrial size was dilated.  5. The mitral valve is normal in structure. Moderate mitral valve regurgitation.  6. The aortic valve is tricuspid. Aortic valve regurgitation is not visualized. FINDINGS  Left Ventricle: Left ventricular ejection fraction, by estimation, is 25 to 30%. The left ventricle has severely decreased function. The left ventricular internal cavity size was normal in size. Right Ventricle: The right ventricular size is normal. No increase in right ventricular wall thickness. Right ventricular systolic function is moderately reduced. Left Atrium: Large echogenic structure (2 cm X 1.7 cm) noted near the roof of left atrium suggestive of thrombus vs mass. Watchman device well seated with no peridevice leak or thrombus on the device. Left atrial size was dilated. Right Atrium: Right atrial size was dilated. Pericardium: There is no evidence of pericardial effusion. Mitral Valve: The mitral valve is normal in structure. Moderate mitral valve regurgitation. Tricuspid Valve: The tricuspid valve is normal in structure. Tricuspid valve regurgitation is trivial. Aortic Valve: The aortic valve is tricuspid. Aortic valve regurgitation is not visualized. Pulmonic Valve: The pulmonic valve was normal in structure. Pulmonic valve regurgitation is not visualized. Aorta: The aortic root is normal in size and structure. IAS/Shunts: No atrial level shunt detected by color flow Doppler. Keller Paterson Electronically signed by Keller Paterson Signature Date/Time: 06/30/2024/4:43:22 PM    Final (Updated)    ECHOCARDIOGRAM LIMITED Result Date: 06/30/2024    ECHOCARDIOGRAM LIMITED  REPORT   Patient Name:   Stephanie [name] Date of Exam: 06/30/2024 Medical Rec #:  [id number]      Height:       65.0 in Accession #:    [id number]     Weight:       164.0 lb Date of Birth:  29-Jan-1951      BSA:          1.818 m Patient Age:    73 years       BP:           103/70 mmHg Patient Gender: F              HR:  62 bpm. Exam Location:  [facility] Procedure: Limited Echo and Intracardiac Opacification Agent (Both Spectral and            Color Flow Doppler were utilized during procedure). Indications:     Congestive Heart Failure I50.9  History:         Patient has prior history of Echocardiogram examinations, most                  recent 06/29/2024. CHF; Arrythmias:Atrial Flutter and Atrial                  Fibrillation.  Sonographer:     Ashley McNeely-Sloane Referring Phys:  [id number] CARALYN HUDSON Diagnosing Phys: Marsa Dooms MD IMPRESSIONS  1. Left ventricular ejection fraction, by estimation, is 60 to 65%. The left ventricle has normal function. The left ventricle has no regional wall motion abnormalities.  2. Right ventricular systolic function is normal. The right ventricular size is normal.  3. Left atrial size was mildly dilated.  4. The mitral valve is normal in structure. No evidence of mitral valve regurgitation. No evidence of mitral stenosis.  5. The aortic valve is normal in structure. Aortic valve regurgitation is not visualized. No aortic stenosis is present.  6. The inferior vena cava is normal in size with greater than 50% respiratory variability, suggesting right atrial pressure of 3 mmHg. FINDINGS  Left Ventricle: Left ventricular ejection fraction, by estimation, is 60 to 65%. The left ventricle has normal function. The left ventricle has no regional wall motion abnormalities. Definity contrast agent was given IV to delineate the left ventricular  endocardial borders. The left ventricular internal cavity size was normal in size. There is no left ventricular hypertrophy. Right  Ventricle: The right ventricular size is normal. No increase in right ventricular wall thickness. Right ventricular systolic function is normal. Left Atrium: Left atrial size was mildly dilated. Pericardium: There is no evidence of pericardial effusion. Mitral Valve: The mitral valve is normal in structure. No evidence of mitral valve stenosis. Tricuspid Valve: The tricuspid valve is normal in structure. Tricuspid valve regurgitation is not demonstrated. No evidence of tricuspid stenosis. Aortic Valve: The aortic valve is normal in structure. Aortic valve regurgitation is not visualized. No aortic stenosis is present. Pulmonic Valve: The pulmonic valve was normal in structure. Pulmonic valve regurgitation is not visualized. No evidence of pulmonic stenosis. Aorta: The aortic root is normal in size and structure. Venous: The inferior vena cava is normal in size with greater than 50% respiratory variability, suggesting right atrial pressure of 3 mmHg. IAS/Shunts: No atrial level shunt detected by color flow Doppler.  LV Volumes (MOD) LV vol d, MOD A4C: 90.8 ml LV vol s, MOD A4C: 60.1 ml LV SV MOD A4C:     90.8 ml Marsa Dooms MD Electronically signed by Marsa Dooms MD Signature Date/Time: 06/30/2024/3:02:50 PM    Final      Echo LVEF 60-65%, 2.0 x 1.5 cm echogenic mass left atrium, probable thrombus  TELEMETRY: Atrial fibrillation 64 bpm:  ASSESSMENT AND PLAN:  Principal Problem:   Left atrial thrombus Active Problems:   Essential hypertension   Tobacco use disorder   Paroxysmal A-fib (HCC)   Atrial fibrillation with RVR (HCC)   COPD (chronic obstructive pulmonary disease) (HCC)   OSA (obstructive sleep apnea)    1.  Left atrial thrombus, 2.0 x 1.7 echogenic mass noted in left atrium during TEE prior to electrical cardioversion which was deferred 2.  Atrial fibrillation, rate controlled, on  metoprolol  succinate 3.  Essential hypertension, blood pressure well-controlled on  metoprolol  succinate and spironolactone  Recommendations  1.  Agree with current therapy 2.  Start Eliquis  5 mg twice daily 3.  DC heparin  drip 4.  Follow-up with Dr. Florencio in 1 week   Marsa Dooms, MD, PhD, FACC 07/01/2024 9:30 AM

## 2024-07-01 NOTE — TOC Transition Note (Signed)
 Transition of Care [facility]) - Discharge Note   Patient Details  Name: Stephanie [name] MRN: [id number] Date of Birth: Nov 12, 1950  Transition of [facility]) CM/SW Contact:  Victory Jackquline RAMAN, RN Phone Number: 07/01/2024, 11:21 AM   Clinical Narrative:   Patient Discharging home today. RNCM reviewed chart, no TOC needs at this time. RNCM Signing off.     Final next level of care: Home/Self Care Barriers to Discharge: No Barriers Identified   Patient Goals and CMS Choice            Discharge Placement                Patient to be transferred to facility by: Granddaughter   Patient and family notified of of transfer: 07/01/24  Discharge Plan and Services Additional resources added to the After Visit Summary for                                       Social Drivers of Health (SDOH) Interventions SDOH Screenings   Food Insecurity: No Food Insecurity (06/29/2024)  Housing: Low Risk  (06/29/2024)  Transportation Needs: No Transportation Needs (06/29/2024)  Utilities: Not At Risk (06/29/2024)  Depression (PHQ2-9): Medium Risk (05/25/2024)  Financial Resource Strain: Low Risk  (04/11/2024)   Received from [facility]  Social Connections: Socially Isolated (06/29/2024)  Tobacco Use: High Risk (06/29/2024)     Readmission Risk Interventions     No data to display

## 2024-07-01 NOTE — Discharge Summary (Signed)
 Physician Discharge Summary   Patient: Stephanie [name] MRN: [id number] DOB: Mar 28, 1951  Admit date:     06/29/2024  Discharge date: 07/01/24  Discharge Physician: Drue ONEIDA Potter   PCP: Watt Mirza, MD   Recommendations at discharge:  Follow-up with cardiologist  Discharge Diagnoses: Principal Problem:   Left atrial thrombus Active Problems:   Paroxysmal A-fib (HCC)   Atrial fibrillation with RVR (HCC)   Essential hypertension   Tobacco use disorder   COPD (chronic obstructive pulmonary disease) (HCC)   OSA (obstructive sleep apnea)  Resolved Problems:   * No resolved hospital problems. [facility] Course:    From HPI Stephanie [name] is a 73 y.o. year old female with past medical history of HTN, HLD, DDD, COPD presenting to the hospital after going to get an ED to get an ablation. She was noted to have LA thrombus. S/p April 2025 ablation and watchman procedure.    Pt states she was not having any complaints prior to coming in.  She was coming for a TEE as she was not A-fib and consideration was for ablation.  On review of system patient denies any complaints such as chest pain, shortness of breath, palpitations but states that with activity she can get lightheadedness.   TRH contacted for admission.     Other hospital course as outlined below  Assessment and Plan:    Left atrial thrombus: Noted during TEE.   Patient was on IV heparin  which was transitioned to Eliquis  Cardiologist recommended not to do 7 days loading dose about 2 to 5 mg twice daily Cardiologist on board we appreciate input  Continue telemetry monitoring   Afib with RVR:  Continue Eliquis  and metoprolol      Chronic Problems: HTN: Continue metoprolol  and spironolactone COPD: On advair OP. Started breo ellipta here. Duonebs prn MDD: continue home zoloft  Tobacco use disorder: Start NRT OSA: restart nightly CPAP.  CKD: renally dose meds, avoid nephrotoxic agents and monitor serial creatinine.        Consultants: Cardiology Procedures performed: None Disposition: Home Diet recommendation:  Cardiac diet DISCHARGE MEDICATION: Allergies as of 07/01/2024       Reactions   Codeine Other (See Comments)   Cannot tolerate alone, only when mixed with another medication. Makes patient feel spaced out. Patient has use codeine combined with other medications.   Lactulose Diarrhea   Lactose Intolerance (gi)    Phenazopyridine Hcl Hives        Medication List     TAKE these medications    acetaminophen  500 MG tablet Commonly known as: TYLENOL  Take 500 mg by mouth every 4 (four) hours as needed (pain).   AeroChamber MV inhaler Use as instructed   albuterol  108 (90 Base) MCG/ACT inhaler Commonly known as: VENTOLIN  HFA Inhale 2 puffs into the lungs every 6 (six) hours as needed for wheezing or shortness of breath.   buPROPion  150 MG 24 hr tablet Commonly known as: Wellbutrin  XL Take 1 tablet (150 mg total) by mouth daily. What changed: when to take this   calcium  citrate-vitamin D  315-200 MG-UNIT tablet Commonly known as: CITRACAL+D Take 1 tablet by mouth at bedtime.   colestipol  1 g tablet Commonly known as: COLESTID  Take 1 g by mouth daily as needed (diarrhea).   Eliquis  5 MG Tabs tablet Generic drug: apixaban  Take 1 tablet (5 mg total) by mouth 2 (two) times daily.   empagliflozin 10 MG Tabs tablet Commonly known as: JARDIANCE Take 10 mg by mouth in the morning.  fluticasone-salmeterol 250-50 MCG/ACT Aepb Commonly known as: ADVAIR Inhale 1 puff into the lungs in the morning and at bedtime.   ipratropium-albuterol  0.5-2.5 (3) MG/3ML Soln Commonly known as: DUONEB Take 3 mLs by nebulization daily in the afternoon.   losartan  25 MG tablet Commonly known as: COZAAR  Take 25 mg by mouth in the morning.   metoprolol  succinate 50 MG 24 hr tablet Commonly known as: TOPROL -XL Take 50 mg by mouth 2 (two) times daily.   montelukast 10 MG tablet Commonly  known as: SINGULAIR Take 10 mg by mouth at bedtime.   sertraline  50 MG tablet Commonly known as: ZOLOFT  Take 50 mg by mouth at bedtime.   spironolactone 25 MG tablet Commonly known as: ALDACTONE Take 12.5 mg by mouth in the morning.   Vevye 0.1 % Soln Generic drug: cycloSPORINE Place 1 drop into both eyes daily.   VITAMIN C  PO Take 1 tablet by mouth at bedtime.        Follow-up Information     Launie Maiden, Ronal Maxwell, NP. Go in 1 week(s).   Specialty: Nurse Practitioner Why: Appointment scheduled for 07/05/24 at 12:30 PM Contact information: [address] [place] [address] [phone]                Discharge Exam: Fredricka Weights   06/29/24 1231  Weight: 74.4 kg   General: NAD HENT: NCAT Lungs: CTAB, no wheeze, rhonchi or rales.  Cardiovascular: IRIR, no r/m/g, 2+ pulses in all extremities. No LE edema Abdomen: No TTP, normal bowel sounds MSK: No asymmetry or muscle atrophy.  Skin: no lesions noted on exposed skin Neuro: Alert and oriented x4. CN grossly intact Psych: Normal mood and normal affect   Condition at discharge: good  The results of significant diagnostics from this hospitalization (including imaging, microbiology, ancillary and laboratory) are listed below for reference.   Imaging Studies: ECHO TEE Result Date: 07/01/2024    TRANSESOPHOGEAL ECHO REPORT   Patient Name:   Stephanie [name] Date of Exam: 06/29/2024 Medical Rec #:  [id number]      Height:       65.0 in Accession #:    [id number]     Weight:       164.0 lb Date of Birth:  Mar 27, 1951      BSA:          1.818 m Patient Age:    73 years       BP:           103/70 mmHg Patient Gender: F              HR:           62 bpm. Exam Location:  [facility] Procedure: Transesophageal Echo, Cardiac Doppler and Color Doppler (Both            Spectral and Color Flow Doppler were utilized during procedure). Indications:     Not listed on TEE check-in sheet  History:         Patient has prior  history of Echocardiogram examinations, most                  recent 11/30/2023. Risk Factors:Hypertension.  Sonographer:     Christopher Furnace Referring Phys:  [id number] CARALYN HUDSON Diagnosing Phys: Keller Paterson PROCEDURE: After discussion of the risks and benefits of a TEE, an informed consent was obtained from the patient. The transesophogeal probe was passed without difficulty through the esophogus of the patient. Sedation performed by different physician.  The patient was monitored while under deep sedation. Image quality was excellent. The patient's vital signs; including heart rate, blood pressure, and oxygen  saturation; remained stable throughout the procedure. The patient developed no complications during the procedure.  IMPRESSIONS  1. Left ventricular ejection fraction, by estimation, is 25 to 30%. The left ventricle has severely decreased function.  2. Right ventricular systolic function is moderately reduced. The right ventricular size is normal.  3. Large echogenic structure (2 cm X 1.7 cm) noted near the roof of left atrium suggestive of thrombus vs mass. Watchman device well seated with no peridevice leak or thrombus on the device. Left atrial size was dilated.  4. Right atrial size was dilated.  5. The mitral valve is normal in structure. Moderate mitral valve regurgitation.  6. The aortic valve is tricuspid. Aortic valve regurgitation is not visualized. FINDINGS  Left Ventricle: Left ventricular ejection fraction, by estimation, is 25 to 30%. The left ventricle has severely decreased function. The left ventricular internal cavity size was normal in size. Right Ventricle: The right ventricular size is normal. No increase in right ventricular wall thickness. Right ventricular systolic function is moderately reduced. Left Atrium: Large echogenic structure (2 cm X 1.7 cm) noted near the roof of left atrium suggestive of thrombus vs mass. Watchman device well seated with no peridevice leak or thrombus on the  device. Left atrial size was dilated. Right Atrium: Right atrial size was dilated. Pericardium: There is no evidence of pericardial effusion. Mitral Valve: The mitral valve is normal in structure. Moderate mitral valve regurgitation. Tricuspid Valve: The tricuspid valve is normal in structure. Tricuspid valve regurgitation is trivial. Aortic Valve: The aortic valve is tricuspid. Aortic valve regurgitation is not visualized. Pulmonic Valve: The pulmonic valve was normal in structure. Pulmonic valve regurgitation is not visualized. Aorta: The aortic root is normal in size and structure. IAS/Shunts: No atrial level shunt detected by color flow Doppler. Keller Paterson Electronically signed by Keller Paterson Signature Date/Time: 06/30/2024/4:43:22 PM    Final (Updated)    ECHOCARDIOGRAM LIMITED Result Date: 06/30/2024    ECHOCARDIOGRAM LIMITED REPORT   Patient Name:   Stephanie [name] Date of Exam: 06/30/2024 Medical Rec #:  [id number]      Height:       65.0 in Accession #:    [id number]     Weight:       164.0 lb Date of Birth:  11-16-1950      BSA:          1.818 m Patient Age:    73 years       BP:           103/70 mmHg Patient Gender: F              HR:           62 bpm. Exam Location:  [facility] Procedure: Limited Echo and Intracardiac Opacification Agent (Both Spectral and            Color Flow Doppler were utilized during procedure). Indications:     Congestive Heart Failure I50.9  History:         Patient has prior history of Echocardiogram examinations, most                  recent 06/29/2024. CHF; Arrythmias:Atrial Flutter and Atrial                  Fibrillation.  Sonographer:     Rosina Dunk Referring Phys:  [id number]  CARALYN HUDSON Diagnosing Phys: Marsa Dooms MD IMPRESSIONS  1. Left ventricular ejection fraction, by estimation, is 60 to 65%. The left ventricle has normal function. The left ventricle has no regional wall motion abnormalities.  2. Right ventricular systolic function is normal. The  right ventricular size is normal.  3. Left atrial size was mildly dilated.  4. The mitral valve is normal in structure. No evidence of mitral valve regurgitation. No evidence of mitral stenosis.  5. The aortic valve is normal in structure. Aortic valve regurgitation is not visualized. No aortic stenosis is present.  6. The inferior vena cava is normal in size with greater than 50% respiratory variability, suggesting right atrial pressure of 3 mmHg. FINDINGS  Left Ventricle: Left ventricular ejection fraction, by estimation, is 60 to 65%. The left ventricle has normal function. The left ventricle has no regional wall motion abnormalities. Definity contrast agent was given IV to delineate the left ventricular  endocardial borders. The left ventricular internal cavity size was normal in size. There is no left ventricular hypertrophy. Right Ventricle: The right ventricular size is normal. No increase in right ventricular wall thickness. Right ventricular systolic function is normal. Left Atrium: Left atrial size was mildly dilated. Pericardium: There is no evidence of pericardial effusion. Mitral Valve: The mitral valve is normal in structure. No evidence of mitral valve stenosis. Tricuspid Valve: The tricuspid valve is normal in structure. Tricuspid valve regurgitation is not demonstrated. No evidence of tricuspid stenosis. Aortic Valve: The aortic valve is normal in structure. Aortic valve regurgitation is not visualized. No aortic stenosis is present. Pulmonic Valve: The pulmonic valve was normal in structure. Pulmonic valve regurgitation is not visualized. No evidence of pulmonic stenosis. Aorta: The aortic root is normal in size and structure. Venous: The inferior vena cava is normal in size with greater than 50% respiratory variability, suggesting right atrial pressure of 3 mmHg. IAS/Shunts: No atrial level shunt detected by color flow Doppler.  LV Volumes (MOD) LV vol d, MOD A4C: 90.8 ml LV vol s, MOD A4C: 60.1  ml LV SV MOD A4C:     90.8 ml Marsa Dooms MD Electronically signed by Marsa Dooms MD Signature Date/Time: 06/30/2024/3:02:50 PM    Final    DG Chest 2 View Result Date: 06/20/2024 CLINICAL DATA:  Chest pain and dizziness. EXAM: CHEST - 2 VIEW COMPARISON:  05/24/2024 FINDINGS: Leads and wires project over the chest on the frontal radiograph. Midline trachea. Borderline cardiomegaly. No pleural effusion or pneumothorax. No congestive failure. Clear lungs. IMPRESSION: Borderline cardiomegaly, without acute disease. Electronically Signed   By: Rockey Kilts M.D.   On: 06/20/2024 16:10    Microbiology: Results for orders placed or performed during the hospital encounter of 05/24/24  Resp panel by RT-PCR (RSV, Flu A&B, Covid) Anterior Nasal Swab     Status: None   Collection Time: 05/24/24  4:20 PM   Specimen: Anterior Nasal Swab  Result Value Ref Range Status   SARS Coronavirus 2 by RT PCR NEGATIVE NEGATIVE Final    Comment: (NOTE) SARS-CoV-2 target nucleic acids are NOT DETECTED.  The SARS-CoV-2 RNA is generally detectable in upper respiratory specimens during the acute phase of infection. The lowest concentration of SARS-CoV-2 viral copies this assay can detect is 138 copies/mL. A negative result does not preclude SARS-Cov-2 infection and should not be used as the sole basis for treatment or other patient management decisions. A negative result may occur with  improper specimen collection/handling, submission of specimen other than nasopharyngeal swab,  presence of viral mutation(s) within the areas targeted by this assay, and inadequate number of viral copies(<138 copies/mL). A negative result must be combined with clinical observations, patient history, and epidemiological information. The expected result is Negative.  Fact Sheet for Patients:  BloggerCourse.com  Fact Sheet for Healthcare Providers:   SeriousBroker.it  This test is no t yet approved or cleared by the [place]  FDA and  has been authorized for detection and/or diagnosis of SARS-CoV-2 by FDA under an Emergency Use Authorization (EUA). This EUA will remain  in effect (meaning this test can be used) for the duration of the COVID-19 declaration under Section 564(b)(1) of the Act, 21 U.S.C.section 360bbb-3(b)(1), unless the authorization is terminated  or revoked sooner.       Influenza A by PCR NEGATIVE NEGATIVE Final   Influenza B by PCR NEGATIVE NEGATIVE Final    Comment: (NOTE) The Xpert Xpress SARS-CoV-2/FLU/RSV plus assay is intended as an aid in the diagnosis of influenza from Nasopharyngeal swab specimens and should not be used as a sole basis for treatment. Nasal washings and aspirates are unacceptable for Xpert Xpress SARS-CoV-2/FLU/RSV testing.  Fact Sheet for Patients: BloggerCourse.com  Fact Sheet for Healthcare Providers: SeriousBroker.it  This test is not yet approved or cleared by the [place]  FDA and has been authorized for detection and/or diagnosis of SARS-CoV-2 by FDA under an Emergency Use Authorization (EUA). This EUA will remain in effect (meaning this test can be used) for the duration of the COVID-19 declaration under Section 564(b)(1) of the Act, 21 U.S.C. section 360bbb-3(b)(1), unless the authorization is terminated or revoked.     Resp Syncytial Virus by PCR NEGATIVE NEGATIVE Final    Comment: (NOTE) Fact Sheet for Patients: BloggerCourse.com  Fact Sheet for Healthcare Providers: SeriousBroker.it  This test is not yet approved or cleared by the [place]  FDA and has been authorized for detection and/or diagnosis of SARS-CoV-2 by FDA under an Emergency Use Authorization (EUA). This EUA will remain in effect (meaning this test can be used) for  the duration of the COVID-19 declaration under Section 564(b)(1) of the Act, 21 U.S.C. section 360bbb-3(b)(1), unless the authorization is terminated or revoked.  Performed at [facility], [address] [place], [place] [address]     Labs: CBC: Recent Labs  Lab 06/29/24 1432 06/30/24 0426 07/01/24 0313  WBC 5.4 5.3 4.4  HGB 15.0 14.1 14.2  HCT 44.3 43.8 43.6  MCV 93.7 96.5 96.2  PLT 224 189 178   Basic Metabolic Panel: Recent Labs  Lab 06/29/24 1507 06/29/24 1739 06/30/24 0426 07/01/24 0313  NA 137 137 140 137  K 4.6 4.1 4.9 4.2  CL 104 102 105 105  CO2 24 27 24 24   GLUCOSE 78 108* 88 83  BUN 18 19 22 19   CREATININE 1.39* 1.40* 1.47* 1.21*  CALCIUM  8.7* 8.6* 8.8* 8.6*  MG  --  2.4  --   --    Liver Function Tests: No results for input(s): AST, ALT, ALKPHOS, BILITOT, PROT, ALBUMIN in the last 168 hours. CBG: No results for input(s): GLUCAP in the last 168 hours.  Discharge time spent:  37 minutes.  Signed: Drue ONEIDA Potter, MD [facility] 07/01/2024

## 2024-07-03 ENCOUNTER — Other Ambulatory Visit: Payer: Self-pay | Admitting: Family Medicine

## 2024-07-03 ENCOUNTER — Telehealth: Payer: Self-pay

## 2024-07-03 NOTE — Telephone Encounter (Signed)
 Received refill request for Zoloft  50 mg. It is listed as historical medication.  Last office visit on 05/25/24 just mentions Wellbutrin .  Please advise.

## 2024-07-03 NOTE — Patient Instructions (Signed)
 Visit Information  Thank you for taking time to visit with me today. Please don't hesitate to contact me if I can be of assistance to you   Patient instructions: monitor for signs of bleeding due to being on eliquis    notify provider for excessive bruising, bleeding from gums, blood in urine and/ or stool. seek emergency medical services for severe SOB, chest pain  Take medications as prescribed. Keep follow up appointments with providers   Patient verbalizes understanding of instructions and care plan provided today and agrees to view in MyChart. Active MyChart status and patient understanding of how to access instructions and care plan via MyChart confirmed with patient.     The patient has been provided with contact information for the care management team and has been advised to call with any health related questions or concerns.   Please call the care guide team at [phone] if you need to cancel or reschedule your appointment.   Please call the Suicide and Crisis Lifeline: 988 call the [place]  National Suicide Prevention Lifeline: 510-398-0688 or TTY: (956)526-8732 TTY (760)249-6451) to talk to a trained counselor call 1-800-273-TALK (toll free, 24 hour hotline) if you are experiencing a Mental Health or Behavioral Health Crisis or need someone to talk to.  Arvin Seip RN, BSN, CCM [employer], Population Health Case Manager Phone: [phone]

## 2024-07-06 ENCOUNTER — Other Ambulatory Visit: Payer: Self-pay | Admitting: Nurse Practitioner

## 2024-07-06 DIAGNOSIS — I5020 Unspecified systolic (congestive) heart failure: Secondary | ICD-10-CM

## 2024-07-06 DIAGNOSIS — I513 Intracardiac thrombosis, not elsewhere classified: Secondary | ICD-10-CM

## 2024-07-06 DIAGNOSIS — Z95818 Presence of other cardiac implants and grafts: Secondary | ICD-10-CM

## 2024-07-06 DIAGNOSIS — I4819 Other persistent atrial fibrillation: Secondary | ICD-10-CM

## 2024-07-06 DIAGNOSIS — I480 Paroxysmal atrial fibrillation: Secondary | ICD-10-CM

## 2024-07-20 ENCOUNTER — Encounter: Payer: Self-pay | Admitting: Radiology

## 2024-07-20 ENCOUNTER — Other Ambulatory Visit: Payer: Self-pay | Admitting: Nurse Practitioner

## 2024-07-20 DIAGNOSIS — I4819 Other persistent atrial fibrillation: Secondary | ICD-10-CM

## 2024-07-20 DIAGNOSIS — Z95818 Presence of other cardiac implants and grafts: Secondary | ICD-10-CM

## 2024-07-20 DIAGNOSIS — I513 Intracardiac thrombosis, not elsewhere classified: Secondary | ICD-10-CM

## 2024-07-20 DIAGNOSIS — I480 Paroxysmal atrial fibrillation: Secondary | ICD-10-CM

## 2024-07-20 DIAGNOSIS — I5020 Unspecified systolic (congestive) heart failure: Secondary | ICD-10-CM

## 2024-07-25 ENCOUNTER — Encounter (HOSPITAL_COMMUNITY): Payer: Self-pay

## 2024-07-25 NOTE — Progress Notes (Unsigned)
 Marris Frontera T. Ziyon Cedotal, MD, CAQ Sports Medicine [facility] [address] [place], [address]  Phone: [phone]  FAX: [phone]  Stephanie [name] - 73 y.o. female  MRN [id number]  Date of Birth: October 01, 1950  Date: 07/26/2024  PCP: Watt Mirza, MD  Referral: Launie Maiden, Ronal Sergeant*  No chief complaint on file.  Subjective:   Stephanie [name] is a 73 y.o. very pleasant female patient with There is no height or weight on file to calculate BMI. who presents with the following:  Discussed the use of AI scribe software for clinical note transcription with the patient, who gave verbal consent to proceed.  This is a complicated relatively new patient to me.  She has advanced heart failure.  History of TIA.  History of myocardial infarction in December 2024. History of A-fib, prior Watchman procedure, prior ablation, chronically on Eliquis .  At the time of her last office visit, she was originally scheduled for depression, however we ended up doing an ER follow-up with a history of A-fib and with RVR in the emergency room.  She was also worried about diarrhea and I sent her to gastroenterology.  I had the patient restart her Wellbutrin .  Since the last time of her office visit with me, the patient has been to the emergency room several times.  A-fib with RVR.  Admitted twice.  [facility] actually referred her to the [facility] heart failure clinic.  Last echo by TEE was 25 to 30%. They have attempted rate control with metoprolol  50 twice daily.  She is also on Eliquis .  In early October, she also had left atrial thrombus.  DCCV was deferred. -She also was in a flutter at her cardiology follow-up History of Present Illness     Review of Systems is noted in the HPI, as appropriate  Objective:   There were no vitals taken for this visit.  GEN: No acute distress; alert,appropriate. PULM: Breathing comfortably in no respiratory  distress PSYCH: Normally interactive.   Laboratory and Imaging Data:  Assessment and Plan:   No diagnosis found. Assessment & Plan   Medication Management during today's office visit: No orders of the defined types were placed in this encounter.  There are no discontinued medications.  Orders placed today for conditions managed today: No orders of the defined types were placed in this encounter.   Disposition: No follow-ups on file.  Dragon [employer] One speech-to-text software was used for transcription in this dictation.  Possible transcriptional errors can occur using Animal nutritionist.   Signed,  Mirza DASEN. Blythe Hartshorn, MD   Outpatient Encounter Medications as of 07/26/2024  Medication Sig   acetaminophen  (TYLENOL ) 500 MG tablet Take 500 mg by mouth every 4 (four) hours as needed (pain).   albuterol  (VENTOLIN  HFA) 108 (90 Base) MCG/ACT inhaler Inhale 2 puffs into the lungs every 6 (six) hours as needed for wheezing or shortness of breath.   apixaban  (ELIQUIS ) 5 MG TABS tablet Take 1 tablet (5 mg total) by mouth 2 (two) times daily.   Ascorbic Acid  (VITAMIN C  PO) Take 1 tablet by mouth at bedtime.   buPROPion  (WELLBUTRIN  XL) 150 MG 24 hr tablet Take 1 tablet (150 mg total) by mouth daily.   calcium  citrate-vitamin D  (CITRACAL+D) 315-200 MG-UNIT per tablet Take 1 tablet by mouth at bedtime.   colestipol  (COLESTID ) 1 g tablet Take 1 g by mouth daily as needed (diarrhea).   empagliflozin (JARDIANCE) 10 MG TABS tablet Take 10  mg by mouth in the morning.   fluticasone-salmeterol (ADVAIR) 250-50 MCG/ACT AEPB Inhale 1 puff into the lungs in the morning and at bedtime.   ipratropium-albuterol  (DUONEB) 0.5-2.5 (3) MG/3ML SOLN Take 3 mLs by nebulization daily in the afternoon.   losartan  (COZAAR ) 25 MG tablet Take 25 mg by mouth in the morning.   metoprolol  succinate (TOPROL -XL) 50 MG 24 hr tablet Take 50 mg by mouth 2 (two) times daily.   montelukast (SINGULAIR) 10 MG tablet Take 10 mg by  mouth at bedtime.   sertraline  (ZOLOFT ) 50 MG tablet TAKE ONE TABLET BY MOUTH ONCE A DAY   Spacer/Aero-Holding Chambers (AEROCHAMBER MV) inhaler Use as instructed (Patient not taking: Reported on 07/03/2024)   spironolactone (ALDACTONE) 25 MG tablet Take 12.5 mg by mouth in the morning. (Patient not taking: Reported on 06/29/2024)   VEVYE 0.1 % SOLN Place 1 drop into both eyes daily.   No facility-administered encounter medications on file as of 07/26/2024.

## 2024-07-26 ENCOUNTER — Ambulatory Visit: Admitting: Family Medicine

## 2024-07-26 ENCOUNTER — Other Ambulatory Visit: Payer: Self-pay | Admitting: Nurse Practitioner

## 2024-07-26 ENCOUNTER — Encounter: Payer: Self-pay | Admitting: Family Medicine

## 2024-07-26 ENCOUNTER — Ambulatory Visit
Admission: RE | Admit: 2024-07-26 | Discharge: 2024-07-26 | Disposition: A | Discharge status: Home / Self Care | Source: Ambulatory Visit | Attending: Nurse Practitioner | Admitting: Nurse Practitioner

## 2024-07-26 ENCOUNTER — Other Ambulatory Visit

## 2024-07-26 VITALS — BP 98/40 | HR 56 | Temp 97.5°F | Ht 65.0 in | Wt 165.0 lb

## 2024-07-26 DIAGNOSIS — I5020 Unspecified systolic (congestive) heart failure: Secondary | ICD-10-CM

## 2024-07-26 DIAGNOSIS — I480 Paroxysmal atrial fibrillation: Secondary | ICD-10-CM

## 2024-07-26 DIAGNOSIS — I4891 Unspecified atrial fibrillation: Secondary | ICD-10-CM | POA: Diagnosis not present

## 2024-07-26 DIAGNOSIS — Z95818 Presence of other cardiac implants and grafts: Secondary | ICD-10-CM

## 2024-07-26 DIAGNOSIS — F411 Generalized anxiety disorder: Secondary | ICD-10-CM

## 2024-07-26 DIAGNOSIS — I513 Intracardiac thrombosis, not elsewhere classified: Secondary | ICD-10-CM

## 2024-07-26 DIAGNOSIS — F331 Major depressive disorder, recurrent, moderate: Secondary | ICD-10-CM

## 2024-07-26 DIAGNOSIS — I4819 Other persistent atrial fibrillation: Secondary | ICD-10-CM

## 2024-07-26 MED ORDER — VARENICLINE TARTRATE (STARTER) 0.5 MG X 11 & 1 MG X 42 PO TBPK: ORAL_TABLET | ORAL | 0 refills | Status: DC

## 2024-07-26 MED ORDER — VARENICLINE TARTRATE 1 MG PO TABS: 1.0000 mg | ORAL_TABLET | Freq: Two times a day (BID) | ORAL | 3 refills | Status: DC

## 2024-07-26 MED ORDER — GADOBUTROL 1 MMOL/ML IV SOLN
10.0000 mL | Freq: Once | INTRAVENOUS | Status: AC | PRN
  Administered 2024-07-26: 10 mL via INTRAVENOUS

## 2024-07-31 NOTE — Progress Notes (Signed)
 Cardiology Follow Up Patient Visit  PRIMARY CARE PROVIDER:   [facility] [address] [place] [address]  Stephanie [name] 07/31/2024 DOB: 02/21/1951 Age: 73 y.o. [facility]    Chief Complaint  Patient presents with  . Congestive Heart Failure    History of Present Illness  Stephanie [name] is a 73 y.o. female here for f/u of HF, AF. Has a h/o chronic HFrEF, AF s/p PVI and LAAO w/ Watchman 12/2023, HTN, HLD, COPD, OSA. Last saw me in Sep 2025. In the interim, she had undergone TEE 06/29/24, which showed severely reduced LVEF and LA roof thrombus vs mass. Watchman device was well seated w/ no peri-device leak noted. Given finding of LAA thrombus, pt was sent to ED. She was admitted to [facility] 10/9-10/11/25. Pt was started initially on heparin  and then transitioned to eliquis . Was then seen by my colleagues in advanced HF on 07/18/24 as well. CMR was obtained at that time. No acute complaints today. Breathing has been about the same, she says. No bleeding on DOAC. No syncope. No PND or orthopnea. Wt has been stable  Current Medications and Allergies   Allergies  Allergen Reactions  . Codeine Other (See Comments)    Makes patient feel spaced out. Patient has since tolerated  codeine combined with other medications.  . Lactulose Other (See Comments)    diarrhea  . Pyridium [Phenazopyridine] Hives    Current Outpatient Medications  Medication Sig Dispense Refill  . ascorbic acid  (VITAMIN C  ORAL) Take 1 tablet by mouth once daily    . buPROPion  (WELLBUTRIN  XL) 150 MG XL tablet     . calcium  carbonate/vitamin D3 (CALCIUM  WITH VITAMIN D  ORAL) Take 1 tablet by mouth once daily    . colestipoL  (COLESTID ) 1 gram tablet Take 2 tablets (2 g total) by mouth 2 (two) times daily before meals Separate from other medications four hours. 120 tablet 3  . ELIQUIS  5 mg tablet TAKE ONE TABLET BY MOUTH EVERY 12 HOURS 60 tablet 1  . empagliflozin (JARDIANCE) 10 mg tablet Take 1  tablet (10 mg total) by mouth once daily 30 tablet 11  . fluticasone propion-salmeteroL (ADVAIR DISKUS) 250-50 mcg/dose diskus inhaler Inhale 1 Puff into the lungs every 12 (twelve) hours (Patient taking differently: Inhale 1 Puff into the lungs every 12 (twelve) hours as needed) 60 each 12  . FUROsemide  (LASIX ) 20 MG tablet Take 1 tablet (20 mg total) by mouth once daily as needed for Edema (or worsening shortness of breath. Do not take more than 3 days consecutively.) 30 tablet 3  . ipratropium (ATROVENT) 21 mcg (0.03 %) nasal spray Place 1 spray into both nostrils 2 (two) times daily as needed for Rhinitis 30 mL 3  . losartan  (COZAAR ) 25 MG tablet Take 1 tablet (25 mg total) by mouth once daily 90 tablet 3  . metoprolol  SUCCinate (TOPROL -XL) 50 MG XL tablet Take 1 tablet (50 mg total) by mouth 2 (two) times daily 180 tablet 3  . montelukast (SINGULAIR) 10 mg tablet Take 1 tablet (10 mg total) by mouth at bedtime 90 tablet 1  . sertraline  (ZOLOFT ) 50 MG tablet take one tablet by mouth once a day 90 tablet 1  . spironolactone (ALDACTONE) 25 MG tablet Take 1 tablet (25 mg total) by mouth once daily 90 tablet 3  . varenicline  tartrate (CHANTIX ) 1 mg tablet Take 1 mg by mouth 2 (two) times daily    . VEVYE 0.1 % Drop Place 1  drop into both eyes 2 (two) times daily    . ipratropium-albuteroL  (DUO-NEB) nebulizer solution Take 3 mLs by nebulization 4 (four) times daily for 30 days 360 mL 0   No current facility-administered medications for this visit.    Additional Past Medical, Social, and Family History:  ADDITIONAL PROBLEM LIST: Problem List  Date Reviewed: 07/31/2024        ICD-10-CM Priority Class Noted - Resolved Diagnosed   Left atrial thrombus I51.3   07/07/2024 - Present    History of TIA (transient ischemic attack) (Chronic) Z86.73   03/29/2024 - Present    Presence of Watchman left atrial appendage closure device (Chronic) Z95.818   03/28/2024 - Present    S/P ablation of atrial  fibrillation Z98.890, Z86.79   01/05/2024 - Present    History of non-ST elevation myocardial infarction (NSTEMI) (Chronic) I25.2   01/05/2024 - Present    Sleep apnea G47.30   12/30/2023 - Present    Preprocedural examination Z01.818   12/29/2023 - Present    HFrEF (heart failure with reduced ejection fraction) (CMS/HHS-HCC) I50.20   12/29/2023 - Present    COPD (chronic obstructive pulmonary disease) (CMS/HHS-HCC) J44.9   12/29/2023 - Present    Chronic combined systolic and diastolic heart failure (CMS/HHS-HCC) (Chronic) I50.42   11/27/2023 - Present    Dyslipidemia E78.5   08/23/2023 - Present    NSTEMI (non-ST elevated myocardial infarction) (CMS/HHS-HCC) I21.4   08/22/2023 - Present    Non-toxic multinodular goiter E04.2   03/16/2023 - Present    QT prolongation R94.31   07/02/2022 - Present    Overview Signed 08/11/2022  9:33 AM by Evern Meade RAMAN, CMA  Last Assessment & Plan: Formatting of this note is different from the original. EKG with QTc prolongation with QTc elevated at 575.  No prior history of this on previous EKGs. -Hold home amiodarone  -EKG in the morning -restart amiodarone  if resolution of QTc prolongation      Acute systolic congestive heart failure (CMS/HHS-HCC) I50.21   02/13/2022 - Present    Obesity E66.9   04/15/2021 - Present    Chronic midline low back pain without sciatica M54.50, G89.29   02/05/2020 - Present    Major depression in remission () F32.5   10/03/2018 - Present    Venous insufficiency of both lower extremities I87.2   05/17/2018 - Present    Benign essential HTN I10   05/03/2018 - Present    Hyperlipidemia, mixed E78.2   05/03/2018 - Present    Persistent atrial fibrillation (CMS/HHS-HCC) I48.19   05/03/2018 - Present    Vitamin D  deficiency E55.9   09/07/2017 - Present    Overview Signed 07/26/2019  3:42 PM by Janit Gal, CMA  Last Assessment & Plan:  Recent level of 33 on calcium  and vitamin and D. Will have her start vitamin D  1000 units once daily.       RLS (restless legs syndrome) G25.81   08/23/2015 - Present    Overview Signed 07/26/2019  3:42 PM by Janit Gal, CMA  Last Assessment & Plan:  Mild improvement with ropinirole , but did not take for very long or consistently. Increased dose today and encouraged her to take consistently. She is to report back if no improvement/symptoms persist.      Anxiety and depression F41.9, F32.A   05/24/2015 - Present    Overview Signed 07/26/2019  3:42 PM by Janit Gal, CMA  Last Assessment & Plan:  Doing well on Zoloft  overall, does have some  increased anxiety and depression as she recently lost her job. She is interested in therapy, referral placed.      Tobacco use Z72.0   05/24/2015 - Present    Overview Signed 07/26/2019  3:42 PM by Janit Gal, CMA  Last Assessment & Plan:  Continues to smoke 1 PPD, discussed cessation today, she is not ready. Lung cancer screening in 2019 without suspicious masses.      RESOLVED: Loose stools R19.5   10/03/2018 - 08/05/2022     ADDITIONAL MEDICAL HISTORY: Past Medical History:  Diagnosis Date  . Allergy   . Anxiety   . Arrhythmia   . Arthritis   . Atrial fibrillation (CMS/HHS-HCC)   . Colon polyp   . Depression   . Hyperlipidemia   . Hypertension   . Hypertension     Social History: See HPI above. Additionally: Social History   Socioeconomic History  . Marital status: Single  Tobacco Use  . Smoking status: Former    Current packs/day: 0.00    Average packs/day: 1 pack/day for 47.0 years (47.0 ttl pk-yrs)    Types: Cigarettes    Start date: 06/22/1975    Quit date: 06/21/2022    Years since quitting: 2.1  . Smokeless tobacco: Never  Vaping Use  . Vaping status: Never Used  Substance and Sexual Activity  . Alcohol  use: Not Currently  . Drug use: Never  . Sexual activity: Not Currently   Social Drivers of Health   Financial Resource Strain: Low Risk  (04/11/2024)   Overall Financial Resource Strain (CARDIA)   . Difficulty of  Paying Living Expenses: Not hard at all  Food Insecurity: No Food Insecurity (07/03/2024)   Received from [facility]   Hunger Vital Sign   . Within the past 12 months, you worried that your food would run out before you got the money to buy more.: Never true   . Within the past 12 months, the food you bought just didn't last and you didn't have money to get more.: Never true  Transportation Needs: No Transportation Needs (07/03/2024)   Received from [facility] - Transportation   . In the past 12 months, has lack of transportation kept you from medical appointments or from getting medications?: No   . In the past 12 months, has lack of transportation kept you from meetings, work, or from getting things needed for daily living?: No  Social Connections: Socially Isolated (06/29/2024)   Received from [facility]   Social Connection and Isolation Panel   . In a typical week, how many times do you talk on the phone with family, friends, or neighbors?: More than three times a week   . How often do you get together with friends or relatives?: Twice a week   . How often do you attend church or religious services?: Never   . Do you belong to any clubs or organizations such as church groups, unions, fraternal or athletic groups, or school groups?: No   . How often do you attend meetings of the clubs or organizations you belong to?: Never   . Are you married, widowed, divorced, separated, never married, or living with a partner?: Divorced  Housing Stability: Low Risk  (05/24/2024)   Housing Stability Vital Sign   . Unable to Pay for Housing in the Last Year: No   . Number of Times Moved in the Last Year: 0   . Homeless in the Last Year: No  Family History: See HPI above. Additionally: Family History  Problem Relation Name Age of Onset  . Alcohol  abuse Mother Michelene Sprang   . No Known Problems Father    . No Known Problems Sister    . No Known Problems Brother    . No Known Problems  Daughter Montie   . No Known Problems Daughter Dorothe   . No Known Problems Daughter Crystal   . No Known Problems Son Ozell   . Anesthesia problems Neg Hx    . Malignant hyperthermia Neg Hx      Review of Systems: See HPI above. Additionally: Constitutional: no fevers or chills Eyes: no diplopia ENT: no epistaxis Cardiovascular: See HPI above Respiratory: See HPI above Gastrointestinal: no BRBPR Genitourinary: no hematuria Musculoskeletal: no joint swelling Skin: no rash Neurological: no strokelike sx Psychiatric: no SI Endocrine: no night sweats Hematologic/Lymphatic: no bleeding Allergic/Immunologic: See allergies above   Physical Exam   Vitals:   07/31/24 1008  BP: 116/72  Pulse: 107   Body mass index is 27.42 kg/m.  Wt Readings from Last 3 Encounters:  07/31/24 74.8 kg (164 lb 12.8 oz)  07/18/24 75.1 kg (165 lb 9.6 oz)  07/05/24 73.5 kg (162 lb)   BP Readings from Last 3 Encounters:  07/31/24 116/72  07/18/24 116/64  07/05/24 114/76   Pulse Readings from Last 3 Encounters:  07/31/24 107  07/18/24 85  07/05/24 84     Gen: NAD Neck: No JVD CV: nl s1s2, mildly tachy, +soft apical systolic murmur Resp: CTAB, nl effort GI: abd soft, NT Skin: warm Edema: no LE edema MSK: no obvious deformity Psych: nl affect Neuro: awake and alert Endo: no thyromegaly Lymph: no cervical LAD   Data/Results   Recent Labs    02/05/22 1037 07/28/22 1024 11/30/22 1000  CHOLTOTAL 257* 212* 207*  HDL 65.4 68.6 70.1  LDLCALC 171* 121 115  VLDL 21 22 22   TRIG 105 112 111    Recent Labs    07/07/22 1013 07/28/22 1024 11/30/22 1000 09/03/23 1128 03/07/24 1151 06/26/24 1444 07/05/24 1350  NA 136 137 141   < > 140 139 139  K 4.0 4.3 3.6   < > 4.7 4.4 5.0  BUN 13 11 12    < > 22 19 20   CREATININE 1.0 1.0 0.9   < > 1.0 1.2* 1.2*  CO2 33.9* 30.7 35.0*   < > 27.4 29.2 33.1*  GLUCOSE 95 89 91   < > 85 98 78  ALT 13 13 9   --   --   --   --   AST 19 15 15   --    --   --   --   TBILI 0.4 0.4 0.4  --   --   --   --   ALB 3.7 3.7 3.8  --   --   --   --    < > = values in this interval not displayed.    Recent Labs    01/07/24 0454 03/07/24 1151 06/26/24 1444  WBC 5.5 5.0 7.0  HGB 10.2* 12.3 14.6  HCT 31.2* 37.7 43.7  MCV 96 96.4 94.8  PLT 152 194 232    Recent Labs    02/05/22 1037 06/09/22 1242 07/28/22 1024 11/30/22 1000 01/05/24 0956 01/12/24 1322  INR  --  1.6*  --   --  1.0  --   TSH 0.359*  --  0.488 1.952  --  1.769  HGBA1C 5.3  --  5.7* 5.5  --   --      ECG: I personally interpreted tracing from 07/31/24 - sinus tachycardia, 111 bpm, nl axis, nonspecific ST-T changes  CMR 07/26/24, read by Dr Budd Kindle, report as follows: IMPRESSION:  1. Normal LV size, severely reduced LV systolic function, LVEF 27%.  2.  No LGE or scar.  3.  No evidence for infiltrative disease or amyloidosis.  4.  Mildly reduced RV systolic function.  5.  Findings suggest non ischemic cardiomyopathy.   TEE 06/29/24, performed by Dr Wilburn  1. Left ventricular ejection fraction, by estimation, is 25 to 30%. The left ventricle has severely decreased function.   2. Right ventricular systolic function is moderately reduced. The right ventricular size is normal.   3. Large echogenic structure (2 cm X 1.7 cm) noted near the roof of left atrium suggestive of thrombus vs mass. Watchman device well seated with no peridevice leak or thrombus on the device. Left atrial size was dilated.   4. Right atrial size was dilated.   5. The mitral valve is normal in structure. Moderate mitral valve regurgitation.   6. The aortic valve is tricuspid. Aortic valve regurgitation is not visualized.   TTE: I reviewed and personally interpreted the images from study 02/2024 MODERATE LEFT VENTRICULAR SYSTOLIC DYSFUNCTION WITH MILD LVH ESTIMATED EF: 35% INDETERMINATE DIASTOLIC FUNCTION NORMAL RIGHT VENTRICULAR SYSTOLIC FUNCTION VALVULAR REGURGITATION: TRIVIAL AR, MILD MR,  TRIVIAL PR, MILD TR ESTIMATED RVSP: 31 mmHg NO VALVULAR STENOSIS Atrial fibrillation noted through out study. Estimated LVEF 30-35%, global hypokinesis with beat to beat variability.  Assessment  73 y.o. female patient with:  -chronic HFrEF 2/2 NICM, NYHA II-III -severe LVSD -pAF s/p PVI and LAAO w/ Watchman 12/2023 -LA thrombus, now chronically anticoagulated -HTN -HLD -COPD -OSA  Plan   -chronic illness w/ exacerbation: HFrEF, AF, LA thrombus w/ recent hospitalization -external notes reviewed: notes from hospitalization 10/9-10/11/25 -tests ordered and interpreted today: ECG showing what looks like sinus tachycardia, 111 bpm, nl axis, nonspecific ST-T changes -will obtain 7d monitor as well, to quantify burden of AF/Fl -Independent interpretation of test performed by another MD: TEE 10/9 showing LA thrombus; CMR 11/5, which also clearly shows a filling defect in posterior LA c/w thrombus. No e/o infiltrative dz on CMR, making amyloid less likely a cause of NICM -case also discussed w/ Dr Wilburn, who performed pt's TEE -continue GDMT for HFrEF, no change to dosing today: metoprolol , losartan , spiro, Jardiance -continue eliquis  5 mg po bid. Will need some form of repeat imaging (TEE v CMR) in 2-3 mos to ensure resolution of LA clot -pt has HF and EP f/u upcoming; f/u with me in three mos as well  Attestation Statement:   I personally performed the service. (TP)  DEARL LEAVEN, MD   Dearl Leaven MD MHS [facility] Assistant Professor of Medicine, Division of Cardiology [facility] of Medicine

## 2024-08-07 ENCOUNTER — Ambulatory Visit: Payer: Self-pay

## 2024-08-07 NOTE — Telephone Encounter (Signed)
 Copied from CRM #[id number]. Topic: General - Other >> Aug 07, 2024 12:16 PM Rosina BIRCH wrote: Reason for CRM: patient called stating the doctor prescribed CHANTIX  to her and it makes her sick and she was told to let the doctor know and he will call in some nausea medication. The medication is making her throw up

## 2024-08-07 NOTE — Telephone Encounter (Signed)
 FYI Only or Action Required?: Action required by provider: update on patient condition and requesting anti-nausea med.  Patient was last seen in primary care on 07/26/2024 by Watt Mirza, MD.  Called Nurse Triage reporting Medication Reaction.  Symptoms began today.  Interventions attempted: Rest, hydration, or home remedies.  Symptoms are: stable.  Triage Disposition: See PCP When Office is Open (Within 3 Days)  Patient/caregiver understands and will follow disposition?: No, wishes to speak with PCP  Copied from CRM #[id number]. Topic: General - Other >> Aug 07, 2024 12:16 PM Rosina BIRCH wrote: Reason for CRM: patient called stating the doctor prescribed CHANTIX  to her and it makes her sick and she was told to let the doctor know and he will call in some nausea medication. The medication is making her throw up  Reason for Disposition  Prescription request for new medicine (not a refill)  Answer Assessment - Initial Assessment Questions 1. NAME of MEDICINE: What medicine(s) are you calling about?     Chantix  2. QUESTION: What is your question? (e.g., double dose of medicine, side effect)     Makes her nauseous- the first green pill of the starter pack (1mg )- the white ones didn't make her sick (0.5mg )  3. PRESCRIBER: Who prescribed the medicine? Reason: if prescribed by specialist, call should be referred to that group.     Dr Mirza Copland  4. SYMPTOMS: Do you have any symptoms? If Yes, ask: What symptoms are you having?  How bad are the symptoms (e.g., mild, moderate, severe)     Nausea and throwing up. And this happened last time she took Chantix . Dr Watt informed her he would call in nausea medication if it happened again. She started the 1mg  pills last night and this morning she threw up. No other changes to meds or diet. No chance of food poisoning. Pt has had no reaction to antinausea meds in the past. Pharmacy confirmed  Protocols used: Medication Question  Call-A-AH

## 2024-08-07 NOTE — Telephone Encounter (Signed)
 PAS attempted to transfer call: pt no longer on line: will place this encounter in call back folder for NT/nurse to call pt back.

## 2024-08-08 ENCOUNTER — Other Ambulatory Visit: Payer: Self-pay | Admitting: Family Medicine

## 2024-08-08 MED ORDER — METOCLOPRAMIDE HCL 5 MG PO TABS: 5.0000 mg | ORAL_TABLET | Freq: Three times a day (TID) | ORAL | 1 refills | Status: AC | PRN

## 2024-08-08 NOTE — Telephone Encounter (Signed)
 I sent her in some Reglan  to try.  She is unable to use the most common nausea medications due to her heart problems and medications.  If she still has nausea and vomitting, she may have to stop the Chantix .

## 2024-08-08 NOTE — Telephone Encounter (Signed)
 Left message for Stephanie [name] that per Dr. Watt:  He sent her in some Reglan  to try.  She is unable to use the most common nausea medications due to her heart problems and medications.   If she still has nausea and vomiting after trying the Reglan , she may have to stop the Chantix .

## 2024-08-16 ENCOUNTER — Encounter: Payer: Self-pay | Admitting: Family Medicine

## 2024-08-16 MED ORDER — VARENICLINE TARTRATE 0.5 MG PO TABS: 0.5000 mg | ORAL_TABLET | Freq: Two times a day (BID) | ORAL | 3 refills | Status: AC

## 2024-09-08 ENCOUNTER — Other Ambulatory Visit: Payer: Self-pay | Admitting: Cardiology

## 2024-09-08 DIAGNOSIS — I513 Intracardiac thrombosis, not elsewhere classified: Secondary | ICD-10-CM

## 2024-09-08 DIAGNOSIS — I4819 Other persistent atrial fibrillation: Secondary | ICD-10-CM

## 2024-09-16 NOTE — Progress Notes (Signed)
 Referring Provider: Elodie Ronal Tinnie Launie, Np [address] [place],  [place] [address]  Primary Care Provider: [facility] [address] [place] [address]   Chief Complaint: No chief complaint on file.    Patient Identification/History of Present Illness:  73 yo with COPD, HReF, OSA. Persistent afib here for f/u after catheter ablation for AF 01/05/24  History of Present Illness Stephanie [name] is a 73 year old female with atrial fibrillation and heart failure who presents for follow-up after ablation.  She underwent an ablation procedure on January 05, 2024, for atrial fibrillation. Initially, she felt well post-procedure, but over the last week or two, she has experienced increased fatigue. This morning, she felt weak while in the shower and had to lie down, although her heart rate was normal at that time. She experiences intermittent episodes of weakness in various situations, such as sitting or standing, and has had these episodes for years.  She has a history of heart failure and continues to experience shortness of breath. Her ejection fraction was previously measured at 30-35% before the ablation. She is currently on multiple medications for heart failure, including Jardiance , which she started recently.  She has chronic obstructive pulmonary disease (COPD) from years of smoking. She monitors her oxygen  saturation, which ranges from 92 to 99%.  Her current medications include amiodarone  and Eliquis , which she is discontinuing. She has a Watchman device in place, which allows her to stop Eliquis . She uses a smartwatch to monitor her heart rate.   She inquires about the possibility of future ablations and the potential need for a pacemaker if atrial fibrillation recurs.    09-08-24 Stephanie [name] is a 73 year old female with atrial flutter and a cardiac thrombus who presents for evaluation of her cardiac conditions.  She underwent watchman  placement in 2023, a cardiac ablation in April 2025, which initially went well. However, she has since developed atrial flutter, experiencing episodes of atrial flutter and atrial tachycardia with symptoms including dizziness and episodes of tachycardia. She has been in and out of atrial arrhythmia for months, with the last episode occurring during a hospital stay. Her heart rate has fluctuated, and she reports feeling unwell when her heart rate is high.  A significant concern is a LA thrombus not near her watchman device, identified in an unusual location during imaging. An MRI performed at a local facility failed to mention it. The clot's presence is concerning as it was initially thought that the watchman device implanted would allow her to stop taking blood thinners. She is currently on multiple heart medications, including metoprolol , spironolactone , losartan , and Jardiance , and she has been taking Eliquis  as a blood thinner.  She has experienced weight gain, having gained four pounds in the last week, and reports episodes of dizziness. Her daughter reports that she has good weeks where she feels energetic and can be active, followed by several days where she feels dizzy and unwell. She has been managing her symptoms with her current medication regimen, which has provided some stability, although she still experiences fluctuations in her condition. Her blood pressure has been noted to drop, sometimes reaching as low as ninety over fifty, which is concerning in conjunction with her heart rate issues.   Problem List:  Patient Active Problem List  Diagnosis   Benign essential HTN   Hyperlipidemia, mixed   Persistent atrial fibrillation (CMS/HHS-HCC)   Venous insufficiency of both lower extremities   Major depression in remission ()  Anxiety and depression   Tobacco use   RLS (restless legs syndrome)   Vitamin D  deficiency   Chronic midline low back pain without sciatica   Acute  systolic congestive heart failure (CMS/HHS-HCC)   Obesity   QT prolongation   Dyslipidemia   NSTEMI (non-ST elevated myocardial infarction) (CMS/HHS-HCC)   Preprocedural examination   HFrEF (heart failure with reduced ejection fraction) (CMS/HHS-HCC)   COPD (chronic obstructive pulmonary disease) (CMS/HHS-HCC)   Sleep apnea   S/P ablation of atrial fibrillation   Chronic combined systolic and diastolic heart failure (CMS/HHS-HCC)   History of non-ST elevation myocardial infarction (NSTEMI)   History of TIA (transient ischemic attack)   Non-toxic multinodular goiter   Presence of Watchman left atrial appendage closure device   Left atrial thrombus     Social History: Social History   Socioeconomic History   Marital status: Single  Tobacco Use   Smoking status: Former    Current packs/day: 0.00    Average packs/day: 1 pack/day for 47.0 years (47.0 ttl pk-yrs)    Types: Cigarettes    Start date: 06/22/1975    Quit date: 06/21/2022    Years since quitting: 2.2   Smokeless tobacco: Never  Vaping Use   Vaping status: Never Used  Substance and Sexual Activity   Alcohol  use: Not Currently   Drug use: Never   Sexual activity: Not Currently   Social Drivers of Health   Financial Resource Strain: Low Risk  (04/11/2024)   Overall Financial Resource Strain (CARDIA)    Difficulty of Paying Living Expenses: Not hard at all  Food Insecurity: No Food Insecurity (07/03/2024)   Received from [facility]   Hunger Vital Sign    Within the past 12 months, you worried that your food would run out before you got the money to buy more.: Never true    Within the past 12 months, the food you bought just didn't last and you didn't have money to get more.: Never true  Transportation Needs: No Transportation Needs (07/03/2024)   Received from [facility] - Transportation    In the past 12 months, has lack of transportation kept you from medical appointments or  from getting medications?: No    In the past 12 months, has lack of transportation kept you from meetings, work, or from getting things needed for daily living?: No  Social Connections: Socially Isolated (06/29/2024)   Received from [facility]   Social Connection and Isolation Panel    In a typical week, how many times do you talk on the phone with family, friends, or neighbors?: More than three times a week    How often do you get together with friends or relatives?: Twice a week    How often do you attend church or religious services?: Never    Do you belong to any clubs or organizations such as church groups, unions, fraternal or athletic groups, or school groups?: No    How often do you attend meetings of the clubs or organizations you belong to?: Never    Are you married, widowed, divorced, separated, never married, or living with a partner?: Divorced  Housing Stability: Low Risk  (05/24/2024)   Housing Stability Vital Sign    Unable to Pay for Housing in the Last Year: No    Number of Times Moved in the Last Year: 0    Homeless in the Last Year: No    Family History: Family History  Problem Relation Name  Age of Onset   Alcohol  abuse Mother Michelene Sprang    No Known Problems Father     No Known Problems Sister     No Known Problems Brother     No Known Problems Daughter Montie    No Known Problems Daughter Dorothe    No Known Problems Daughter Crystal    No Known Problems Son Ozell    Anesthesia problems Neg Hx     Malignant hyperthermia Neg Hx      Medications: Current Outpatient Medications  Medication Sig Dispense Refill   ascorbic acid  (VITAMIN C  ORAL) Take 1 tablet by mouth once daily     calcium  carbonate/vitamin D3 (CALCIUM  WITH VITAMIN D  ORAL) Take 1 tablet by mouth once daily     colestipoL  (COLESTID ) 1 gram tablet Take 2 tablets (2 g total) by mouth 2 (two) times daily before meals Separate from other medications four hours. 120 tablet 3    ELIQUIS  5 mg tablet TAKE ONE TABLET BY MOUTH EVERY 12 HOURS 60 tablet 1   empagliflozin  (JARDIANCE ) 10 mg tablet Take 1 tablet (10 mg total) by mouth once daily 30 tablet 11   fluticasone  propion-salmeteroL (ADVAIR DISKUS) 250-50 mcg/dose diskus inhaler Inhale 1 Puff into the lungs every 12 (twelve) hours (Patient taking differently: Inhale 1 Puff into the lungs every 12 (twelve) hours as needed) 60 each 12   FUROsemide  (LASIX ) 20 MG tablet Take 1 tablet (20 mg total) by mouth once daily as needed for Edema (or worsening shortness of breath. Do not take more than 3 days consecutively.) 30 tablet 3   ipratropium (ATROVENT) 21 mcg (0.03 %) nasal spray Place 1 spray into both nostrils 2 (two) times daily as needed for Rhinitis 30 mL 3   ipratropium-albuteroL  (DUO-NEB) nebulizer solution Take 3 mLs by nebulization 4 (four) times daily for 30 days (Patient not taking: Reported on 08/28/2024) 360 mL 0   losartan  (COZAAR ) 25 MG tablet Take 1 tablet (25 mg total) by mouth once daily 90 tablet 3   metoprolol  SUCCinate (TOPROL -XL) 50 MG XL tablet Take 1 tablet (50 mg total) by mouth 2 (two) times daily 180 tablet 3   montelukast  (SINGULAIR ) 10 mg tablet Take 1 tablet (10 mg total) by mouth at bedtime 90 tablet 1   sertraline  (ZOLOFT ) 50 MG tablet take one tablet by mouth once a day 90 tablet 1   spironolactone  (ALDACTONE ) 25 MG tablet Take 1 tablet (25 mg total) by mouth once daily 90 tablet 3   varenicline  tartrate (CHANTIX ) 0.5 MG tablet Take 0.5 mg by mouth 2 (two) times daily     varenicline  tartrate (CHANTIX ) 1 mg tablet Take 1 mg by mouth 2 (two) times daily (Patient not taking: Reported on 08/28/2024)     VEVYE  0.1 % Drop Place 1 drop into both eyes 2 (two) times daily     No current facility-administered medications for this visit.    Allergies: Allergies  Allergen Reactions   Codeine Other (See Comments)    Makes patient feel spaced out. Patient has since tolerated  codeine combined  with other medications.   Lactulose Other (See Comments)    diarrhea   Pyridium [Phenazopyridine] Hives    Review of Systems: positive if checked   GENERAL HEALTH/CONSTITUTIONAL []  Appetite change []  Fevers or chills [x]  Fatigue []  Unexpected weight gain/loss []  Reduced activity  EYES, EARS, NOSE, THROAT []  Vision changes (blurred, double vision) []  Hearing changes / loss []  Tinnitus / ringing in ears []   Dental problem (dentition / gums) []  Vertigo / dizziness []  Nose bleeds [x]  Voice changes []  Trouble swallowing  RESPIRATORY []  Cough []  Shortness of breath []  Sleep apnea []  Snoring []  Sputum []  Wheezing  CARDIOVASCULAR []  Chest pain, pressure, or tightness [x]  Palpitations/heart flutters [x]  Near passing out []  Passing out []  Leg / ankle swelling []  Waking up short of breath []  Sleeping on an incline/more than one pillow []  Leg pain / cramps with walking  GASTROINTESTINAL []  Abdominal bloating/swelling []  Abdominal pain []  Blood on toilet tissue, in bowl, in stool []  Black, tarry stool []  Constipation []  Diarrhea []  Nausea []  Vomiting []  Heartburn symptoms Decreased appetite URINARY []  Blood in urine []  Excessive urination []  Frequent urination []  Difficulty urinating []  Flank pain / painful urination []  Excess sweating / thirst []  Nocturia need to urinate more then 1 time at night []  Decreased amount of urine  MUSCULOSKELETAL []  Joint aches/pains []  Swollen joints []  Back pain []  Muscle aches / pains  SKIN []  Color change []  Rash []  Wound  NEUROLOGIC []  Memory Changes []  Tremors / problems with balance []  Weakness []  Tingling / paresthesias []  Numbness [x]  Headaches []  Speech difficulty  HEMATOLOGIC [x]  History of blood clots []  Bruises/bleeds easily  PSYCHIATRIC []  Anxiety / Stress []  Depression []  Sleep disturbance  ENDOCRINE []  Thyroid  problems []  Heat intolerance []  Cold Intolerance     Physical  Examination Vitals: BP 106/62   Pulse 108   Ht 165.1 cm (5' 5)   Wt 76.3 kg (168 lb 3.2 oz)   SpO2 96%   BMI 27.99 kg/m   General Appearance:  alert, cooperative, in NAD  HEENT: oropharynx clear, moist mucous membranes  Neurologic: motor and sensory grossly normal bilaterally   Psych: oriented to time, place and person, mood and affect are approprate  Neck: no jugular venous distension, no bruits  Lungs:   clear to auscultation with good air movement, no rales bilaterally   Heart:  regular rhythm with rate 80's  Abdomen:   soft, nontender, nondistended, normoactive bowel sounds   Extremities: no pedal edema noted, warm   Musculoskeletal: Moves all extremities. Ambulates without Difficulty   Skin: No rashes or ulcers   12 lead ECG:  I personally ordered, reviewed, and interpreted the ECG performed on 09/08/2024 showing: rhythm: normal sinus rhythm,  Results for orders placed or performed in visit on 09/08/24  ECG 12-lead   Collection Time: 09/08/24 11:18 AM  Result Value Ref Range   Vent Rate (bpm) 97    QRS Interval (msec) 94    QT Interval (msec) 360    QTc (msec) 457       Review and Summary of Prior Cardiac Studies: Echocardiography (08-23-23) -     1. Left ventricular ejection fraction, by estimation, is 40 to 45%. Left ventricular ejection fraction by PLAX is 45 %. The left ventricle has mildly decreased function. The left ventricle demonstrates global hypokinesis. Left ventricular diastolic  parameters are consistent with Grade I diastolic dysfunction (impaired relaxation).   2. Right ventricular systolic function is normal. The right ventricular size is normal. There is normal pulmonary artery systolic pressure. The estimated right ventricular systolic pressure is 32.8 mmHg.   3. Left atrial size was mildly dilated.   4. Right atrial size was mildly dilated.   5. The mitral valve is degenerative. Moderate mitral valve regurgitation.   6. The tricuspid valve is  degenerative. Tricuspid valve regurgitation is severe.   7. The aortic valve is  normal in structure. Aortic valve regurgitation is not visualized. No aortic stenosis is present.   FINDINGS   Left Ventricle: Left ventricular ejection fraction, by estimation, is 40 to 45%. Left ventricular ejection fraction by PLAX is 45 %. The left ventricle has mildly decreased function. The left ventricle demonstrates global hypokinesis. The left  ventricular internal cavity size was normal in size. There is no left ventricular hypertrophy. Left ventricular diastolic parameters are consistent with Grade I diastolic dysfunction (impaired relaxation).   Right Ventricle: The right ventricular size is normal. No increase in right ventricular wall thickness. Right ventricular systolic function is normal. There is normal pulmonary artery systolic pressure. The tricuspid regurgitant velocity is 2.73 m/s, and   with an assumed right atrial pressure of 3 mmHg, the estimated right ventricular systolic pressure is 32.8 mmHg.   Left Atrium: Left atrial size was mildly dilated.   Right Atrium: Right atrial size was mildly dilated.   Pericardium: There is no evidence of pericardial effusion.   Mitral Valve: The mitral valve is degenerative in appearance. Moderate mitral valve regurgitation. MV peak gradient, 3.0 mmHg. The mean mitral valve gradient is 1.0 mmHg.   Tricuspid Valve: The tricuspid valve is degenerative in appearance. Tricuspid valve regurgitation is severe.   Aortic Valve: The aortic valve is normal in structure. Aortic valve regurgitation is not visualized. No aortic stenosis is present. Aortic valve mean gradient measures 1.0 mmHg. Aortic valve peak gradient measures 2.2 mmHg. Aortic valve area, by VTI  measures 2.85 cm.   Pulmonic Valve: The pulmonic valve was grossly normal. Pulmonic valve regurgitation is not visualized.   Aorta: The aortic root is normal in size and structure.   IAS/Shunts: No  atrial level shunt detected by color flow Doppler.    LEFT VENTRICLE  PLAX 2D  LV EF:         Left ventricular ejection fraction by PLAX is 45 %.  LVIDd:         4.90 cm  LVIDs:         3.80 cm  LV PW:         1.40 cm  LV IVS:        1.20 cm  LVOT diam:     2.00 cm  LV SV:         41  LV SV Index:   22  LVOT Area:     3.14 cm    RIGHT VENTRICLE  RV Basal diam:  3.15 cm  RV Mid diam:    3.30 cm  RV S prime:     13.90 cm/s   LEFT ATRIUM             Index        RIGHT ATRIUM           Index  LA diam:        4.00 cm 2.19 cm/m   RA Area:     18.40 cm  LA Vol (A2C):   57.8 ml 31.66 ml/m  RA Volume:   50.40 ml  27.60 ml/m  LA Vol (A4C):   46.1 ml 25.25 ml/m  LA Biplane Vol: 53.9 ml 29.52 ml/m   AORTIC VALVE                    PULMONIC VALVE  AV Area (Vmax):    2.88 cm     PV Vmax:       0.71 m/s  AV Area (Vmean):   2.13  cm     PV Peak grad:  2.0 mmHg  AV Area (VTI):     2.85 cm  AV Vmax:           73.70 cm/s  AV Vmean:          51.250 cm/s  AV VTI:            0.144 m  AV Peak Grad:      2.2 mmHg  AV Mean Grad:      1.0 mmHg  LVOT Vmax:         67.50 cm/s  LVOT Vmean:        34.700 cm/s  LVOT VTI:          0.130 m  LVOT/AV VTI ratio: 0.91   AORTA  Ao Root diam: 3.20 cm   MITRAL VALVE               TRICUSPID VALVE  MV Area (PHT): 3.81 cm    TR Peak grad:   29.8 mmHg  MV Area VTI:   1.70 cm    TR Vmax:        273.00 cm/s  MV Peak grad:  3.0 mmHg  MV Mean grad:  1.0 mmHg    SHUNTS  MV Vmax:       0.87 m/s    Systemic VTI:  0.13 m  MV Vmean:      43.1 cm/s   Systemic Diam: 2.00 cm  MV Decel Time: 199 msec  MV E velocity: 77.60 cm/s   03/09/24  CONCLUSION ------------------------------------------------------------------------------- MODERATE LEFT VENTRICULAR SYSTOLIC DYSFUNCTION WITH MILD LVH ESTIMATED EF: 35% INDETERMINATE DIASTOLIC FUNCTION NORMAL RIGHT VENTRICULAR SYSTOLIC FUNCTION VALVULAR REGURGITATION: TRIVIAL AR, MILD MR, TRIVIAL PR, MILD  TR ESTIMATED RVSP: 31 mmHg NO VALVULAR STENOSIS ------------------------------------------------------------------------------------------ Atrial fibrillation noted through out study. Estimated LVEF 30-35%, global hypokinesis with beat to beat variability.    TEE 06/29/24 MPRESSIONS     1. Left ventricular ejection fraction, by estimation, is 25 to 30%. The left ventricle has severely decreased function.   2. Right ventricular systolic function is moderately reduced. The right ventricular size is normal.   3. Large echogenic structure (2 cm X 1.7 cm) noted near the roof of left atrium suggestive of thrombus vs mass. Watchman device well seated with no peridevice leak or thrombus on the device. Left atrial size was dilated.   4. Right atrial size was dilated.   5. The mitral valve is normal in structure. Moderate mitral valve regurgitation.   6. The aortic valve is tricuspid. Aortic valve regurgitation is not visualized.   FINDINGS   Left Ventricle: Left ventricular ejection fraction, by estimation, is 25 to 30%. The left ventricle has severely decreased function. The left ventricular internal cavity size was normal in size.   Right Ventricle: The right ventricular size is normal. No increase in right ventricular wall thickness. Right ventricular systolic function is moderately reduced.   Left Atrium: Large echogenic structure (2 cm X 1.7 cm) noted near the roof of left atrium suggestive of thrombus vs mass. Watchman device well seated with no peridevice leak or thrombus on the device. Left atrial size was dilated.   Right Atrium: Right atrial size was dilated.   Pericardium: There is no evidence of pericardial effusion.   Mitral Valve: The mitral valve is normal in structure. Moderate mitral valve regurgitation.   Tricuspid Valve: The tricuspid valve is normal in structure. Tricuspid valve regurgitation is trivial.   Aortic Valve: The  aortic valve is tricuspid. Aortic valve  regurgitation is not visualized.   Pulmonic Valve: The pulmonic valve was normal in structure. Pulmonic valve regurgitation is not visualized.   Aorta: The aortic root is normal in size and structure.   IAS/Shunts: No atrial level shunt detected by color flow Doppler.   Left Heart catheterization / Coronary angiography (08-23-23) - Left ventriculogram showed normal left ventricular function EF of 60%   Coronaries  Left main large free of disease  LAD large free of disease  Circumflex large free of disease  RCA large free of disease  Right dominant system   No significant epicardial coron   Labs: Hematology Lab Results  Component Value Date   WBC 7.0 06/26/2024   HGB 14.6 06/26/2024   HCT 43.7 06/26/2024   MCV 94.8 06/26/2024   PLT 232 06/26/2024    Chemistries Lab Results  Component Value Date   CREATININE 1.2 (H) 08/22/2024   BUN 18 08/22/2024   NA 139 08/22/2024   K 4.7 08/22/2024   CL 102 08/22/2024   CO2 31.3 08/22/2024    Liver Function Studies Lab Results  Component Value Date   ALT 9 11/30/2022   AST 15 11/30/2022   ALKPHOS 82 11/30/2022    Thyroid  Function Studies Lab Results  Component Value Date   TSH 1.769 01/12/2024    INR Lab Results  Component Value Date   INR 1.0 01/05/2024   INR 1.6 (H) 06/09/2022   INR 0.9 (L) 06/03/2021   03/03/24 Assessment & Plan Paroxysmal atrial fibrillation Status post-ablation, currently in normal sinus rhythm. Symptoms likely related to heart failure. Discussed potential recurrence and future interventions. - Discontinue amiodarone  and Eliquis . - Monitor heart rate and rhythm using a smartwatch or Kardia device.  Heart failure Chronic heart failure with reduced ejection fraction (30-35%). Symptoms include shortness of breath and fatigue. Ablation may improve ejection fraction. Echocardiogram needed to assess impact of ablation. - Repeat echocardiogram to assess ejection fraction post-ablation. - Continue  GDMT for heart failure. - Coordinate with Dr. Elodie for ongoing heart failure management.  Chronic obstructive pulmonary disease (COPD) COPD contributing to shortness of breath, exacerbated by heart failure. Smoking history is a factor.  Tobacco use Tobacco use contributing to COPD and overall health status. Smoking cessation is crucial.    09-08-24  Persistent atrial fibrillation and atrial flutter Post-ablation in April, now experiencing atrial flutter, likely originating from the left side of the heart. Atrial flutter can occur post-ablation, affecting 10-15% of patients. Current rhythm is primarily atrial flutter, with potential for recurrence of atrial fibrillation. Cardioversion is a temporary fix, and ablation is necessary for long-term management. - Ordered MRI to assess for resolution of intracardiac LA thrombus. - If MRI shows resolution of thrombus, will proceed with cardioversion and schedule ablation. - If thrombus persists, will consult with hematology to consider switching to Lovenox .   Intracardiac thrombus Presence of a large intracardiac thrombus, not near the device. MRI did not initially identify the clot, raising concerns about imaging accuracy. Further imaging is necessary to confirm resolution before proceeding with cardioversion and ablation. - Ordered MRI at [facility] or [facility] to reassess thrombus. - If thrombus is resolved, will proceed with cardioversion and ablation. - If thrombus persists, will consider switching to Lovenox .  Heart failure with reduced ejection fraction Heart failure with reduced ejection fraction, currently managed with multiple medications. Reports of dizziness and weight gain, possibly related to heart rate and blood pressure fluctuations. Current medications include metoprolol , losartan ,  spironolactone , and Jardiance . - Continue current heart failure medications. - Monitor heart rate and blood pressure closely. - Adjust medications as needed  based on symptoms and blood pressure readings.    I spent a total of 35 minutes in both face-to-face and non-face-to-face activities, excluding procedures performed, for this visit on the date of this encounter.      Future Appointments    Date/Time Provider Department Center Visit Type   11/29/2024 1:00 PM [facility] WEST PULM PFT [facility] MARYL C PFT   11/29/2024 1:30 PM Parris Manna, MD [facility] C RETURN VISIT

## 2024-09-27 ENCOUNTER — Other Ambulatory Visit (HOSPITAL_COMMUNITY)

## 2024-09-28 ENCOUNTER — Encounter (HOSPITAL_COMMUNITY): Payer: Self-pay

## 2024-09-29 ENCOUNTER — Other Ambulatory Visit: Payer: Self-pay | Admitting: Cardiology

## 2024-09-29 ENCOUNTER — Ambulatory Visit (HOSPITAL_COMMUNITY)
Admission: RE | Admit: 2024-09-29 | Discharge: 2024-09-29 | Disposition: A | Source: Ambulatory Visit | Attending: Cardiology | Admitting: Cardiology

## 2024-09-29 DIAGNOSIS — I4819 Other persistent atrial fibrillation: Secondary | ICD-10-CM | POA: Insufficient documentation

## 2024-09-29 DIAGNOSIS — I513 Intracardiac thrombosis, not elsewhere classified: Secondary | ICD-10-CM | POA: Insufficient documentation

## 2024-09-29 MED ORDER — GADOBUTROL 1 MMOL/ML IV SOLN
7.0000 mL | Freq: Once | INTRAVENOUS | Status: AC | PRN
  Administered 2024-09-29: 7 mL via INTRAVENOUS

## 2024-10-18 ENCOUNTER — Other Ambulatory Visit: Payer: Self-pay | Admitting: Family Medicine

## 2024-10-19 ENCOUNTER — Ambulatory Visit

## 2025-01-01 ENCOUNTER — Ambulatory Visit
